# Patient Record
Sex: Female | Born: 1970 | Race: Black or African American | Hispanic: No | Marital: Single | Smoking: Current every day smoker
Health system: Southern US, Community
[De-identification: ages and names within clinical notes are randomized; demographics above are authoritative.]

## PROBLEM LIST (undated history)

## (undated) DIAGNOSIS — R519 Headache, unspecified: Secondary | ICD-10-CM

## (undated) DIAGNOSIS — G459 Transient cerebral ischemic attack, unspecified: Secondary | ICD-10-CM

## (undated) DIAGNOSIS — R51 Headache: Secondary | ICD-10-CM

## (undated) DIAGNOSIS — E119 Type 2 diabetes mellitus without complications: Secondary | ICD-10-CM

## (undated) DIAGNOSIS — I6789 Other cerebrovascular disease: Secondary | ICD-10-CM

## (undated) DIAGNOSIS — F319 Bipolar disorder, unspecified: Secondary | ICD-10-CM

## (undated) DIAGNOSIS — F32A Depression, unspecified: Secondary | ICD-10-CM

## (undated) DIAGNOSIS — M4712 Other spondylosis with myelopathy, cervical region: Secondary | ICD-10-CM

## (undated) DIAGNOSIS — I639 Cerebral infarction, unspecified: Secondary | ICD-10-CM

## (undated) DIAGNOSIS — F419 Anxiety disorder, unspecified: Secondary | ICD-10-CM

## (undated) DIAGNOSIS — D649 Anemia, unspecified: Secondary | ICD-10-CM

## (undated) DIAGNOSIS — R569 Unspecified convulsions: Secondary | ICD-10-CM

## (undated) DIAGNOSIS — Q2112 Patent foramen ovale: Secondary | ICD-10-CM

## (undated) DIAGNOSIS — K760 Fatty (change of) liver, not elsewhere classified: Secondary | ICD-10-CM

## (undated) DIAGNOSIS — G709 Myoneural disorder, unspecified: Secondary | ICD-10-CM

## (undated) DIAGNOSIS — M1990 Unspecified osteoarthritis, unspecified site: Secondary | ICD-10-CM

## (undated) DIAGNOSIS — K219 Gastro-esophageal reflux disease without esophagitis: Secondary | ICD-10-CM

## (undated) DIAGNOSIS — I10 Essential (primary) hypertension: Secondary | ICD-10-CM

## (undated) DIAGNOSIS — Q211 Atrial septal defect, unspecified: Secondary | ICD-10-CM

## (undated) DIAGNOSIS — F329 Major depressive disorder, single episode, unspecified: Secondary | ICD-10-CM

## (undated) DIAGNOSIS — K8050 Calculus of bile duct without cholangitis or cholecystitis without obstruction: Secondary | ICD-10-CM

## (undated) HISTORY — PX: TUBAL LIGATION: SHX77

## (undated) HISTORY — PX: APPENDECTOMY: SHX54

## (undated) HISTORY — PX: ABDOMINAL HYSTERECTOMY: SHX81

## (undated) HISTORY — PX: OVARIAN CYST REMOVAL: SHX89

---

## 2003-11-26 ENCOUNTER — Emergency Department: Payer: Self-pay | Admitting: Unknown Physician Specialty

## 2004-01-13 ENCOUNTER — Emergency Department: Payer: Self-pay | Admitting: General Practice

## 2004-01-24 ENCOUNTER — Ambulatory Visit: Payer: Self-pay | Admitting: Surgery

## 2004-06-28 ENCOUNTER — Emergency Department: Payer: Self-pay | Admitting: General Practice

## 2004-08-13 ENCOUNTER — Emergency Department: Payer: Self-pay | Admitting: Emergency Medicine

## 2006-03-02 ENCOUNTER — Emergency Department: Payer: Self-pay | Admitting: Emergency Medicine

## 2006-03-29 ENCOUNTER — Emergency Department: Payer: Self-pay | Admitting: Emergency Medicine

## 2006-03-31 ENCOUNTER — Emergency Department: Payer: Self-pay | Admitting: Emergency Medicine

## 2006-08-23 ENCOUNTER — Emergency Department: Payer: Self-pay | Admitting: Emergency Medicine

## 2006-08-23 ENCOUNTER — Other Ambulatory Visit: Payer: Self-pay

## 2006-09-18 ENCOUNTER — Emergency Department: Payer: Self-pay | Admitting: Emergency Medicine

## 2006-11-28 ENCOUNTER — Emergency Department: Payer: Self-pay | Admitting: Emergency Medicine

## 2007-02-26 ENCOUNTER — Other Ambulatory Visit: Payer: Self-pay

## 2007-02-26 ENCOUNTER — Emergency Department: Payer: Self-pay | Admitting: Emergency Medicine

## 2007-09-13 ENCOUNTER — Emergency Department: Payer: Self-pay | Admitting: Emergency Medicine

## 2007-11-15 ENCOUNTER — Emergency Department: Payer: Self-pay | Admitting: Otolaryngology

## 2008-04-28 ENCOUNTER — Emergency Department: Payer: Self-pay | Admitting: Emergency Medicine

## 2008-06-06 ENCOUNTER — Emergency Department: Payer: Self-pay | Admitting: Unknown Physician Specialty

## 2008-07-03 ENCOUNTER — Observation Stay: Payer: Self-pay | Admitting: General Surgery

## 2008-10-22 ENCOUNTER — Emergency Department: Payer: Self-pay | Admitting: Emergency Medicine

## 2010-01-08 ENCOUNTER — Emergency Department: Payer: Self-pay | Admitting: Emergency Medicine

## 2010-06-12 ENCOUNTER — Other Ambulatory Visit: Payer: Self-pay | Admitting: Neurology

## 2010-06-12 DIAGNOSIS — R51 Headache: Secondary | ICD-10-CM

## 2010-06-18 ENCOUNTER — Ambulatory Visit
Admission: RE | Admit: 2010-06-18 | Discharge: 2010-06-18 | Disposition: A | Discharge status: Home / Self Care | Payer: Self-pay | Source: Ambulatory Visit | Attending: Neurology | Admitting: Neurology

## 2010-06-18 DIAGNOSIS — R51 Headache: Secondary | ICD-10-CM

## 2010-06-21 ENCOUNTER — Emergency Department: Payer: Self-pay | Admitting: Unknown Physician Specialty

## 2010-12-26 ENCOUNTER — Emergency Department: Payer: Self-pay | Admitting: Emergency Medicine

## 2011-07-13 ENCOUNTER — Emergency Department: Payer: Self-pay | Admitting: *Deleted

## 2011-07-13 LAB — BASIC METABOLIC PANEL
Anion Gap: 8 (ref 7–16)
BUN: 7 mg/dL (ref 7–18)
Calcium, Total: 8.7 mg/dL (ref 8.5–10.1)
Co2: 24 mmol/L (ref 21–32)
EGFR (African American): >60
Osmolality: 278 (ref 275–301)
Potassium: 3.8 mmol/L (ref 3.5–5.1)
Sodium: 141 mmol/L (ref 136–145)

## 2011-07-13 LAB — CBC
HCT: 38.2 % (ref 35.0–47.0)
MCV: 81 fL (ref 80–100)
RBC: 4.73 10*6/uL (ref 3.80–5.20)
RDW: 13.7 % (ref 11.5–14.5)
WBC: 5.6 10*3/uL (ref 3.6–11.0)

## 2011-07-13 LAB — CK TOTAL AND CKMB (NOT AT ARMC): CK, Total: 105 U/L (ref 21–215)

## 2011-09-21 ENCOUNTER — Emergency Department: Payer: Self-pay | Admitting: Emergency Medicine

## 2011-09-21 LAB — COMPREHENSIVE METABOLIC PANEL
Albumin: 4.2 g/dL (ref 3.4–5.0)
Anion Gap: 8 (ref 7–16)
BUN: 7 mg/dL (ref 7–18)
Creatinine: 0.6 mg/dL (ref 0.60–1.30)
Glucose: 93 mg/dL (ref 65–99)
Osmolality: 275 (ref 275–301)
Potassium: 3.9 mmol/L (ref 3.5–5.1)
SGOT(AST): 17 U/L (ref 15–37)
Sodium: 139 mmol/L (ref 136–145)
Total Protein: 7.9 g/dL (ref 6.4–8.2)

## 2011-09-21 LAB — CBC WITH DIFFERENTIAL/PLATELET
Basophil #: 0 10*3/uL (ref 0.0–0.1)
Eosinophil %: 1.6 %
HCT: 40.7 % (ref 35.0–47.0)
Lymphocyte %: 57.5 %
Monocyte %: 8.5 %
Neutrophil #: 1.4 10*3/uL (ref 1.4–6.5)
RBC: 5.14 10*6/uL (ref 3.80–5.20)
RDW: 14.2 % (ref 11.5–14.5)
WBC: 4.3 10*3/uL (ref 3.6–11.0)

## 2012-03-27 ENCOUNTER — Emergency Department: Payer: Self-pay | Admitting: Emergency Medicine

## 2012-08-09 ENCOUNTER — Emergency Department: Payer: Self-pay | Admitting: Emergency Medicine

## 2013-04-21 ENCOUNTER — Emergency Department: Payer: Self-pay | Admitting: Emergency Medicine

## 2013-04-21 LAB — URINALYSIS, COMPLETE
Bilirubin,UR: NEGATIVE
GLUCOSE, UR: NEGATIVE mg/dL (ref 0–75)
KETONE: NEGATIVE
Nitrite: NEGATIVE
Ph: 5 (ref 4.5–8.0)
Protein: 30
Specific Gravity: 1.023 (ref 1.003–1.030)
WBC UR: 216 /HPF (ref 0–5)

## 2015-02-11 ENCOUNTER — Emergency Department (HOSPITAL_COMMUNITY)
Admission: EM | Admit: 2015-02-11 | Discharge: 2015-02-11 | Disposition: A | Discharge status: Home / Self Care | Payer: Self-pay | Attending: Emergency Medicine | Admitting: Emergency Medicine

## 2015-02-11 ENCOUNTER — Encounter (HOSPITAL_COMMUNITY): Payer: Self-pay | Admitting: Nurse Practitioner

## 2015-02-11 DIAGNOSIS — M542 Cervicalgia: Secondary | ICD-10-CM | POA: Insufficient documentation

## 2015-02-11 DIAGNOSIS — R202 Paresthesia of skin: Secondary | ICD-10-CM | POA: Insufficient documentation

## 2015-02-11 DIAGNOSIS — G8929 Other chronic pain: Secondary | ICD-10-CM | POA: Insufficient documentation

## 2015-02-11 DIAGNOSIS — M25511 Pain in right shoulder: Secondary | ICD-10-CM | POA: Insufficient documentation

## 2015-02-11 NOTE — Discharge Instructions (Signed)
Perform gentle range of motion exercises daily. Use heat to your shoulder throughout the day, using a heat pack for 20 minutes at a time every hour. Use motrin/aleve or other over the counter pain medications as needed for pain. Call orthopedic follow up today or tomorrow to schedule followup appointment for recheck of ongoing shoulder pain in 1-2 weeks that can be canceled with a 24-48 hour notice if complete resolution of pain. Return to the ER for changes or worsening symptoms.    Shoulder Pain The shoulder is the joint that connects your arm to your body. Muscles and band-like tissues that connect bones to muscles (tendons) hold the joint together. Shoulder pain is felt if an injury or medical problem affects one or more parts of the shoulder. HOME CARE   Put ice on the sore area.  Put ice in a plastic bag.  Place a towel between your skin and the bag.  Leave the ice on for 15-20 minutes, 03-04 times a day for the first 2 days.  Stop using cold packs if they do not help with the pain.  If you were given something to keep your shoulder from moving (sling; shoulder immobilizer), wear it as told. Only take it off to shower or bathe.  Move your arm as little as possible, but keep your hand moving to prevent puffiness (swelling).  Squeeze a soft ball or foam pad as much as possible to help prevent swelling.  Take medicine as told by your doctor. GET HELP IF:  You have progressing new pain in your arm, hand, or fingers.  Your hand or fingers get cold.  Your medicine does not help lessen your pain. GET HELP RIGHT AWAY IF:   Your arm, hand, or fingers are numb or tingling.  Your arm, hand, or fingers are puffy (swollen), painful, or turn white or blue. MAKE SURE YOU:   Understand these instructions.  Will watch your condition.  Will get help right away if you are not doing well or get worse.   This information is not intended to replace advice given to you by your health care  provider. Make sure you discuss any questions you have with your health care provider.   Document Released: 07/28/2007 Document Revised: 03/01/2014 Document Reviewed: 06/03/2014 Elsevier Interactive Patient Education 2016 [employer]  Shoulder Range of Motion Exercises Shoulder range of motion (ROM) exercises are designed to keep the shoulder moving freely. They are often recommended for people who have shoulder pain. MOVEMENT EXERCISE When you are able, do this exercise 5-6 days per week, or as told by your health care provider. Work toward doing 2 sets of 10 swings. Pendulum Exercise How To Do This Exercise Lying Down  Lie face-down on a bed with your abdomen close to the side of the bed.  Let your arm hang over the side of the bed.  Relax your shoulder, arm, and hand.  Slowly and gently swing your arm forward and back. Do not use your neck muscles to swing your arm. They should be relaxed. If you are struggling to swing your arm, have someone gently swing it for you. When you do this exercise for the first time, swing your arm at a 15 degree angle for 15 seconds, or swing your arm 10 times. As pain lessens over time, increase the angle of the swing to 30-45 degrees.  Repeat steps 1-4 with the other arm. How To Do This Exercise While Standing  Stand next to a sturdy chair  or table and hold on to it with your hand.  Bend forward at the waist.  Bend your knees slightly.  Relax your other arm and let it hang limp.  Relax the shoulder blade of the arm that is hanging and let it drop.  While keeping your shoulder relaxed, use body motion to swing your arm in small circles. The first time you do this exercise, swing your arm for about 30 seconds or 10 times. When you do it next time, swing your arm for a little longer.  Stand up tall and relax.  Repeat steps 1-7, this time changing the direction of the circles.  Repeat steps 1-8 with the other arm. STRETCHING EXERCISES Do  these exercises 3-4 times per day on 5-6 days per week or as told by your health care provider. Work toward holding the stretch for 20 seconds. Stretching Exercise 1  Lift your arm straight out in front of you.  Bend your arm 90 degrees at the elbow (right angle) so your forearm goes across your body and looks like the letter "L."  Use your other arm to gently pull the elbow forward and across your body.  Repeat steps 1-3 with the other arm. Stretching Exercise 2 You will need a towel or rope for this exercise.  Bend one arm behind your back with the palm facing outward.  Hold a towel with your other hand.  Reach the arm that holds the towel above your head, and bend that arm at the elbow. Your wrist should be behind your neck.  Use your free hand to grab the free end of the towel.  With the higher hand, gently pull the towel up behind you.  With the lower hand, pull the towel down behind you.  Repeat steps 1-6 with the other arm. STRENGTHENING EXERCISES Do each of these exercises at four different times of day (sessions) every day or as told by your health care provider. To begin with, repeat each exercise 5 times (repetitions). Work toward doing 3 sets of 12 repetitions or as told by your health care provider. Strengthening Exercise 1 You will need a light weight for this activity. As you grow stronger, you may use a heavier weight. 1. Standing with a weight in your hand, lift your arm straight out to the side until it is at the same height as your shoulder. 2. Bend your arm at 90 degrees so that your fingers are pointing to the ceiling. 3. Slowly raise your hand until your arm is straight up in the air. 4. Repeat steps 1-3 with the other arm. Strengthening Exercise 2 You will need a light weight for this activity. As you grow stronger, you may use a heavier weight. 1. Standing with a weight in your hand, gradually move your straight arm in an arc, starting at your side, then  out in front of you, then straight up over your head. 2. Gradually move your other arm in an arc, starting at your side, then out in front of you, then straight up over your head. 3. Repeat steps 1-2 with the other arm. Strengthening Exercise 3 You will need an elastic band for this activity. As you grow stronger, gradually increase the size of the bands or increase the number of bands that you use at one time. 1. While standing, hold an elastic band in one hand and raise that arm up in the air. 2. With your other hand, pull down the band until that hand is  by your side. 3. Repeat steps 1-2 with the other arm.   This information is not intended to replace advice given to you by your health care provider. Make sure you discuss any questions you have with your health care provider.   Document Released: 11/07/2002 Document Revised: 06/25/2014 Document Reviewed: 02/04/2014 Elsevier Interactive Patient Education 2016 [employer]  Paresthesia Paresthesia is a burning or prickling feeling. This feeling can happen in any part of the body. It often happens in the hands, arms, legs, or feet. Usually, it is not painful. In most cases, the feeling goes away in a short time and is not a sign of a serious problem. HOME CARE  Avoid drinking alcohol.  Try massage or needle therapy (acupuncture) to help with your problems.  Keep all follow-up visits as told by your doctor. This is important. GET HELP IF:  You keep on having episodes of paresthesia.  Your burning or prickling feeling gets worse when you walk.  You have pain or cramps.  You feel dizzy.  You have a rash. GET HELP RIGHT AWAY IF:  You feel weak.  You have trouble walking or moving.  You have problems speaking, understanding, or seeing.  You feel confused.  You cannot control when you pee (urinate) or poop (bowel movement).  You lose feeling (numbness) after an injury.  You pass out (faint).   This information is not  intended to replace advice given to you by your health care provider. Make sure you discuss any questions you have with your health care provider.   Document Released: 01/22/2008 Document Revised: 06/25/2014 Document Reviewed: 02/04/2014 Elsevier Interactive Patient Education 2016 [place] therapy can help ease sore, stiff, injured, and tight muscles and joints. Heat relaxes your muscles, which may help ease your pain. Heat therapy should only be used on old, pre-existing, or long-lasting (chronic) injuries. Do not use heat therapy unless told by your doctor. HOW TO USE HEAT THERAPY There are several different kinds of heat therapy, including:  Moist heat pack.  Warm water bath.  Hot water bottle.  Electric heating pad.  Heated gel pack.  Heated wrap.  Electric heating pad. GENERAL HEAT THERAPY RECOMMENDATIONS   Do not sleep while using heat therapy. Only use heat therapy while you are awake.  Your skin may turn pink while using heat therapy. Do not use heat therapy if your skin turns red.  Do not use heat therapy if you have new pain.  High heat or long exposure to heat can cause burns. Be careful when using heat therapy to avoid burning your skin.  Do not use heat therapy on areas of your skin that are already irritated, such as with a rash or sunburn. GET HELP IF:   You have blisters, redness, swelling (puffiness), or numbness.  You have new pain.  Your pain is worse. MAKE SURE YOU:  Understand these instructions.  Will watch your condition.  Will get help right away if you are not doing well or get worse.   This information is not intended to replace advice given to you by your health care provider. Make sure you discuss any questions you have with your health care provider.   Document Released: 05/03/2011 Document Revised: 03/01/2014 Document Reviewed: 04/03/2013 Elsevier Interactive Patient Education [employer].

## 2015-02-11 NOTE — ED Provider Notes (Signed)
CSN: [id number]     Arrival date & time 02/11/15  1740 History  By signing my name below, I, Heather Figueroa, attest that this documentation has been prepared under the direction and in the presence of Nichole Neyer Camprubi-Soms, PA-C. Electronically Signed: Julien Figueroa, ED Scribe. 02/11/2015. 6:05 PM.    Chief Complaint  Patient presents with  . Shoulder Pain      Patient is a 44 y.o. female presenting with shoulder pain. The history is provided by the patient. No language interpreter was used.  Shoulder Pain Location:  Shoulder Time since incident:  12 months Injury: no   Shoulder location:  R shoulder Pain details:    Quality:  Cramping and aching   Radiates to:  R elbow   Severity:  Moderate   Onset quality:  Gradual   Duration:  12 months   Timing:  Constant   Progression:  Worsening Chronicity:  Chronic Handedness:  Left-handed Dislocation: no   Foreign body present:  No foreign bodies Prior injury to area:  No Relieved by:  Nothing Worsened by:  Movement Ineffective treatments:  Acetaminophen Associated symptoms: neck pain (R sided)   Associated symptoms: no back pain, no decreased range of motion, no fever, no numbness, no swelling and no tingling   HPI Comments: Heather Figueroa is a 44 y.o. female who presents to the Emergency Department complaining of constant, 9/10, gradual worsening aching and cramping right shoulder pain with associated right sided neck pain onset one year ago. Pt endorses intermittent numbness that radiates to her elbow which occurred earlier but has since resolved. She notes that the numbness will come out of the blue. Pt notes that moving her right arm and laying down increases her pain. She has taken Aleve and BC powder to alleviate the pain with no relief. Denies injury, headaches, visual changes, fevers, chills, CP, SOB, abd pain, N/V/D/C, hematuria, dysuria, myalgias, joint swelling, focal weakness, or skin changes. She has not seen a doctor for  her symptoms.   No past medical history on file. No past surgical history on file. No family history on file. Social History  Substance Use Topics  . Smoking status: Not on file  . Smokeless tobacco: Not on file  . Alcohol Use: Not on file   OB History    No data available     Review of Systems  Constitutional: Negative for fever and chills.  Eyes: Negative for visual disturbance.  Respiratory: Negative for shortness of breath.   Cardiovascular: Negative for chest pain.  Gastrointestinal: Negative for nausea, vomiting, abdominal pain, diarrhea and constipation.  Genitourinary: Negative for dysuria and hematuria.  Musculoskeletal: Positive for arthralgias and neck pain (R sided). Negative for back pain and joint swelling.  Skin: Negative for color change.  Allergic/Immunologic: Negative for immunocompromised state.  Neurological: Negative for weakness, numbness and headaches.   10 Systems reviewed and are negative for acute change except as noted in the HPI.    Allergies  Review of patient's allergies indicates not on file.  Home Medications   Prior to Admission medications   Not on File   Triage vitals: BP 146/99 mmHg  Pulse 86  Temp(Src) 98.1 F (36.7 C) (Oral)  Resp 18  Ht 5' (1.524 m)  Wt 137 lb (62.143 kg)  BMI 26.76 kg/m2  SpO2 99% Physical Exam  Constitutional: She is oriented to person, place, and time. Vital signs are normal. She appears well-developed and well-nourished.  Non-toxic appearance. No distress.  Afebrile, nontoxic, NAD  HENT:  Head: Normocephalic and atraumatic.  Mouth/Throat: Mucous membranes are normal.  Eyes: Conjunctivae and EOM are normal. Right eye exhibits no discharge. Left eye exhibits no discharge.  Neck: Normal range of motion. Neck supple. Muscular tenderness present. No rigidity. Normal range of motion present.    FROM intact without spinous process TTP, no bony stepoffs or deformities, with mild right sided paraspinous  muscle TTP without muscle spasms. No rigidity or meningeal signs. No bruising or swelling.   Cardiovascular: Normal rate.   Pulmonary/Chest: Effort normal. No respiratory distress.  Abdominal: Normal appearance. She exhibits no distension.  Musculoskeletal: Normal range of motion.       Right shoulder: She exhibits tenderness. She exhibits normal range of motion, no bony tenderness, no swelling, no crepitus, no deformity, no spasm, normal pulse and normal strength.  Right shoulder with FROM intact, no bony TTP, with mild diffuse parascapular muscular TTP without spasms, no swelling/effusion, no crepitus/deformity, +apley scratch, +pain with resisted int/ext rotation, neg empty can test. Strength and sensation grossly intact in all extremities, distal pulses intact.    Neurological: She is alert and oriented to person, place, and time. She has normal strength. No sensory deficit.  Skin: Skin is warm, dry and intact. No rash noted.  Psychiatric: She has a normal mood and affect. Her behavior is normal.  Nursing note and vitals reviewed.   ED Course  Procedures  DIAGNOSTIC STUDIES: Oxygen Saturation is 99% on RA, normal by my interpretation.  COORDINATION OF CARE:  6:00 PM Discussed treatment plan which includes heat compresses, continue aleve, schedule appointment with orthopaedist with pt at bedside and pt agreed to plan.  Labs Review Labs Reviewed - No data to display  Imaging Review No results found. I have personally reviewed and evaluated these images and lab results as part of my medical decision-making.   EKG Interpretation None      MDM   Final diagnoses:  Shoulder joint pain, right  Paresthesia of right arm    44 y.o. female here with chronic shoulder pain. NVI with soft compartments. Diffuse muscular tenderness, no focal bony tenderness. Doubt need for imaging given chronic nature and no bony tenderness. Had paresthesia earlier which resolved. Discussed ultimately f/up  with ortho could provide more answers, possible MRI and nerve conduction study. Doubt that immobilization would help. Discussed tylenol/motrin for pain, and use of heat. I explained the diagnosis and have given explicit precautions to return to the ER including for any other new or worsening symptoms. The patient understands and accepts the medical plan as it's been dictated and I have answered their questions. Discharge instructions concerning home care and prescriptions have been given. The patient is STABLE and is discharged to home in good condition.   I personally performed the services described in this documentation, which was scribed in my presence. The recorded information has been reviewed and is accurate.  BP 146/99 mmHg  Pulse 86  Temp(Src) 98.1 F (36.7 C) (Oral)  Resp 18  Ht 5' (1.524 m)  Wt 62.143 kg  BMI 26.76 kg/m2  SpO2 99%  No orders of the defined types were placed in this encounter.     [address] [place], PA-C 02/11/15 1808  Lacretia Leigh, MD 02/13/15 [phone]

## 2015-02-11 NOTE — ED Notes (Signed)
She c/o R shoulder pain and numbness radiating into elbow for past year.she states the pain became more severe today and she can not tolerate any longer. Pain increased with lying on the extremity. Pain decreased by nothing. Tried aleve several times with no relief.

## 2015-08-07 ENCOUNTER — Other Ambulatory Visit (HOSPITAL_COMMUNITY)
Admission: RE | Admit: 2015-08-07 | Discharge: 2015-08-07 | Disposition: A | Discharge status: Home / Self Care | Payer: Managed Care, Other (non HMO) | Source: Ambulatory Visit | Attending: Family Medicine | Admitting: Family Medicine

## 2015-08-07 ENCOUNTER — Other Ambulatory Visit: Payer: Self-pay | Admitting: Family Medicine

## 2015-08-07 DIAGNOSIS — Z01419 Encounter for gynecological examination (general) (routine) without abnormal findings: Secondary | ICD-10-CM | POA: Diagnosis present

## 2015-08-07 DIAGNOSIS — Z1231 Encounter for screening mammogram for malignant neoplasm of breast: Secondary | ICD-10-CM

## 2015-08-11 LAB — CYTOLOGY - PAP

## 2015-08-21 ENCOUNTER — Ambulatory Visit: Payer: Self-pay

## 2015-09-04 ENCOUNTER — Ambulatory Visit
Admission: RE | Admit: 2015-09-04 | Discharge: 2015-09-04 | Disposition: A | Discharge status: Home / Self Care | Payer: Managed Care, Other (non HMO) | Source: Ambulatory Visit | Attending: Family Medicine | Admitting: Family Medicine

## 2015-09-04 DIAGNOSIS — Z1231 Encounter for screening mammogram for malignant neoplasm of breast: Secondary | ICD-10-CM

## 2015-10-28 ENCOUNTER — Emergency Department (HOSPITAL_COMMUNITY): Payer: Managed Care, Other (non HMO)

## 2015-10-28 ENCOUNTER — Emergency Department (HOSPITAL_COMMUNITY)
Admission: EM | Admit: 2015-10-28 | Discharge: 2015-10-28 | Disposition: A | Discharge status: Home / Self Care | Payer: Managed Care, Other (non HMO) | Attending: Emergency Medicine | Admitting: Emergency Medicine

## 2015-10-28 ENCOUNTER — Encounter (HOSPITAL_COMMUNITY): Payer: Self-pay | Admitting: *Deleted

## 2015-10-28 DIAGNOSIS — G43809 Other migraine, not intractable, without status migrainosus: Secondary | ICD-10-CM

## 2015-10-28 DIAGNOSIS — Z79899 Other long term (current) drug therapy: Secondary | ICD-10-CM | POA: Insufficient documentation

## 2015-10-28 DIAGNOSIS — J189 Pneumonia, unspecified organism: Secondary | ICD-10-CM | POA: Diagnosis not present

## 2015-10-28 DIAGNOSIS — F17210 Nicotine dependence, cigarettes, uncomplicated: Secondary | ICD-10-CM | POA: Insufficient documentation

## 2015-10-28 DIAGNOSIS — R51 Headache: Secondary | ICD-10-CM | POA: Diagnosis present

## 2015-10-28 LAB — CBC
HEMATOCRIT: 37.7 % (ref 36.0–46.0)
HEMOGLOBIN: 12.1 g/dL (ref 12.0–15.0)
MCH: 25.7 pg — ABNORMAL LOW (ref 26.0–34.0)
MCHC: 32.1 g/dL (ref 30.0–36.0)
MCV: 80 fL (ref 78.0–100.0)
Platelets: 200 10*3/uL (ref 150–400)
RBC: 4.71 MIL/uL (ref 3.87–5.11)
RDW: 14.5 % (ref 11.5–15.5)
WBC: 4.8 10*3/uL (ref 4.0–10.5)

## 2015-10-28 LAB — BASIC METABOLIC PANEL
ANION GAP: 7 (ref 5–15)
BUN: 8 mg/dL (ref 6–20)
CO2: 20 mmol/L — ABNORMAL LOW (ref 22–32)
Calcium: 9.1 mg/dL (ref 8.9–10.3)
Chloride: 111 mmol/L (ref 101–111)
Creatinine, Ser: 0.58 mg/dL (ref 0.44–1.00)
Glucose, Bld: 92 mg/dL (ref 65–99)
POTASSIUM: 3.7 mmol/L (ref 3.5–5.1)
SODIUM: 138 mmol/L (ref 135–145)

## 2015-10-28 LAB — SALICYLATE LEVEL: Salicylate Lvl: <4 mg/dL (ref 2.8–30.0)

## 2015-10-28 LAB — I-STAT BETA HCG BLOOD, ED (MC, WL, AP ONLY)

## 2015-10-28 LAB — RAPID URINE DRUG SCREEN, HOSP PERFORMED
Amphetamines: NOT DETECTED
BARBITURATES: NOT DETECTED
Benzodiazepines: NOT DETECTED
COCAINE: NOT DETECTED
OPIATES: NOT DETECTED
Tetrahydrocannabinol: NOT DETECTED

## 2015-10-28 LAB — ETHANOL

## 2015-10-28 LAB — I-STAT TROPONIN, ED: Troponin i, poc: 0 ng/mL (ref 0.00–0.08)

## 2015-10-28 LAB — ACETAMINOPHEN LEVEL: Acetaminophen (Tylenol), Serum: <10 ug/mL — ABNORMAL LOW (ref 10–30)

## 2015-10-28 MED ORDER — SODIUM CHLORIDE 0.9 % IV BOLUS (SEPSIS)
1000.0000 mL | Freq: Once | INTRAVENOUS | Status: AC
Start: 2015-10-28 — End: 2015-10-28
  Administered 2015-10-28: 1000 mL via INTRAVENOUS

## 2015-10-28 MED ORDER — KETOROLAC TROMETHAMINE 30 MG/ML IJ SOLN
30.0000 mg | Freq: Once | INTRAMUSCULAR | Status: AC
  Administered 2015-10-28: 30 mg via INTRAVENOUS
  Filled 2015-10-28: qty 1

## 2015-10-28 MED ORDER — DOXYCYCLINE HYCLATE 100 MG PO CAPS: 100.0000 mg | ORAL_CAPSULE | Freq: Two times a day (BID) | ORAL | 0 refills | Status: DC

## 2015-10-28 MED ORDER — DOXYCYCLINE HYCLATE 100 MG PO TABS
100.0000 mg | ORAL_TABLET | Freq: Once | ORAL | Status: AC
  Administered 2015-10-28: 100 mg via ORAL
  Filled 2015-10-28: qty 1

## 2015-10-28 MED ORDER — HALOPERIDOL LACTATE 5 MG/ML IJ SOLN
2.0000 mg | Freq: Once | INTRAMUSCULAR | Status: AC
  Administered 2015-10-28: 2 mg via INTRAVENOUS
  Filled 2015-10-28: qty 1

## 2015-10-28 MED ORDER — METOCLOPRAMIDE HCL 5 MG/ML IJ SOLN
10.0000 mg | Freq: Once | INTRAMUSCULAR | Status: AC
  Administered 2015-10-28: 10 mg via INTRAVENOUS
  Filled 2015-10-28: qty 2

## 2015-10-28 NOTE — ED Triage Notes (Signed)
Pt in from home, pt reports waking up today with migraine & mid non radiating CP onset today, no SOB, pt reports x 2 vomiting last night, denies v/d, pt rcvd 324 mg ASA, x 2 sL nitro, pt reported to have fall today, pt denies  Neck & back, pt A&O x4

## 2015-10-28 NOTE — ED Notes (Signed)
Pt was able to ambulate in hallway with no assistance. Pt only complained on pain to left foot when pressure was applied while walking.

## 2015-10-28 NOTE — ED Notes (Signed)
Pt departed in NAD.  

## 2015-10-28 NOTE — ED Provider Notes (Signed)
[place] DEPT Provider Note   CSN: [id number] Arrival date & time: 10/28/15  1158     History   Chief Complaint Chief Complaint  Patient presents with  . Chest Pain  . Fall    HPI Heather Figueroa is a 45 y.o. female.  HPI  45 year old female who presents with headache and mental status changes. History of migraine headaches. History primarily provided by significant other. States she has been feeling well and she was her normal self this morning. Had two episodes of vomiting last night and complained of headache late morning that was consistent with her typical migraine headaches. Her significant other states that she called her this morning stating that her headache was not getting better and so she returned home to see her, and found her on the ground. Stated that she was not breathing well and not responsive, so she called EMS. She felt that she was not breathing, so gave her chest compressions, which she started to groan. EMS arrived. She had pulses. Family states that she is under significant amount of stress recently. No tongue biting or incontinence.    History reviewed. No pertinent past medical history.  There are no active problems to display for this patient.   History reviewed. No pertinent surgical history.  OB History    No data available       Home Medications    Prior to Admission medications   Medication Sig Start Date End Date Taking? Authorizing Provider  traZODone (DESYREL) 50 MG tablet Take 50-100 mg by mouth at bedtime as needed for sleep.  08/15/15  Yes Historical Provider, MD  venlafaxine XR (EFFEXOR-XR) 150 MG 24 hr capsule Take 150 mg by mouth every morning. 10/02/15  Yes Historical Provider, MD    Family History No family history on file.  Social History Social History  Substance Use Topics  . Smoking status: Current Every Day Smoker    Packs/day: 1.00    Types: Cigarettes  . Smokeless tobacco: Not on file  . Alcohol use No      Allergies   Percocet [oxycodone-acetaminophen] and Tylenol [acetaminophen]   Review of Systems Review of Systems 10/14 systems reviewed and are negative other than those stated in the HPI   Physical Exam Updated Vital Signs BP 122/82   Pulse 69   Temp 98.3 F (36.8 C) (Axillary)   Resp 15   SpO2 100%   Physical Exam Physical Exam  Nursing note and vitals reviewed. Constitutional:  non-toxic, and in no acute distress Head: Normocephalic and atraumatic.  Mouth/Throat: Oropharynx is clear and moist.  Neck: Normal range of motion. Neck supple.  Cardiovascular: Normal rate and regular rhythm.   Pulmonary/Chest: Effort normal and breath sounds normal.  Abdominal: Soft. There is no tenderness. There is no rebound and no guarding.  Musculoskeletal: Normal range of motion.  Neurological: Alert, tearful, answers questions minimally, no facial droop, fluent speech, moves all extremities symmetrically, sensation to light touch in tact throughout, PERRL Skin: Skin is warm and dry.  Psychiatric: Cooperative   ED Treatments / Results  Labs (all labs ordered are listed, but only abnormal results are displayed) Labs Reviewed  BASIC METABOLIC PANEL - Abnormal; Notable for the following:       Result Value   CO2 20 (*)    All other components within normal limits  CBC - Abnormal; Notable for the following:    MCH 25.7 (*)    All other components within normal limits  ACETAMINOPHEN LEVEL - Abnormal; Notable for the following:    Acetaminophen (Tylenol), Serum <10 (*)    All other components within normal limits  URINE RAPID DRUG SCREEN, HOSP PERFORMED  SALICYLATE LEVEL  ETHANOL  I-STAT TROPOININ, ED  I-STAT BETA HCG BLOOD, ED ([facility], [facility], [facility] ONLY)    EKG  EKG Interpretation  Date/Time:  Tuesday October 28 2015 12:07:03 EDT Ventricular Rate:  65 PR Interval:    QRS Duration: 86 QT Interval:  391 QTC Calculation: 407 R Axis:   66 Text Interpretation:  Sinus rhythm  Anterior infarct, old No significant change since last tracing Confirmed by Sanaia Jasso MD, Bethany Hirt [phone]) on 10/28/2015 1:41:24 PM       Radiology Dg Chest 2 View  Result Date: 10/28/2015 CLINICAL DATA:  Cough and weakness. EXAM: CHEST  2 VIEW COMPARISON:  None. FINDINGS: Ill-defined left lower lobe airspace disease is concerning for early pneumonia. The lungs are otherwise clear. The heart size is normal. The visualized soft tissues and bony thorax are unremarkable. IMPRESSION: Early left lower lobe pneumonia. Electronically Signed   By: San Morelle M.D.   On: 10/28/2015 12:31   Ct Head Wo Contrast  Result Date: 10/28/2015 CLINICAL DATA:  Syncopal episode, fell to ground. Frontal headache and nausea. Altered mental status. EXAM: CT HEAD WITHOUT CONTRAST TECHNIQUE: Contiguous axial images were obtained from the base of the skull through the vertex without intravenous contrast. COMPARISON:  CT HEAD April 21, 2013 FINDINGS: BRAIN: The ventricles and sulci are normal. No intraparenchymal hemorrhage, mass effect nor midline shift. No acute large vascular territory infarcts. No abnormal extra-axial fluid collections. Basal cisterns are patent. VASCULAR: Unremarkable. SKULL/SOFT TISSUES: No skull fracture. No significant soft tissue swelling. ORBITS/SINUSES: The included ocular globes and orbital contents are normal.The mastoid aircells and included paranasal sinuses are well-aerated. OTHER: None. IMPRESSION: No acute intracranial process ; normal CT HEAD. Electronically Signed   By: Elon Alas M.D.   On: 10/28/2015 14:45    Procedures Procedures (including critical care time)  Medications Ordered in ED Medications  sodium chloride 0.9 % bolus 1,000 mL (0 mLs Intravenous Stopped 10/28/15 1541)  metoCLOPramide (REGLAN) injection 10 mg (10 mg Intravenous Given 10/28/15 1409)  ketorolac (TORADOL) 30 MG/ML injection 30 mg (30 mg Intravenous Given 10/28/15 1407)  haloperidol lactate (HALDOL) injection 2  mg (2 mg Intravenous Given 10/28/15 1653)  doxycycline (VIBRA-TABS) tablet 100 mg (100 mg Oral Given 10/28/15 1653)     Initial Impression / Assessment and Plan / ED Course  I have reviewed the triage vital signs and the nursing notes.  Pertinent labs & imaging results that were available during my care of the patient were reviewed by me and considered in my medical decision making (see chart for details).  Clinical Course   Presenting with headache and mental status changes. On presentation, tearful but alert, answering simple questions, no focal deficits. CT head negative. EKG w/o stigmata of arrhythmia.   Do not think she had true cardiopulmonary arrest, as she was breathing normally and with normal VS on ems arrival. No true loss of pulses. Aroused after sternal rub. CP after CPR and sternal rub. CXR w/ concerning PNA. Will treat with doxycycline but no fever, leukocytosis or other SIRS criteria.   Treated for complex migraine with IVF, reglan, toradol and subsequent haldol. Able to ambulate. Feeling improved headache and back at baseline mental status but sleepy. Will observe in ED until more awake. ? Potential sz vs atypical migraine HA (which is  more likely). Will given neuro follow-up as outpatient. Will treat for CAP w/ doxycycline as outpatient.   Final Clinical Impressions(s) / ED Diagnoses   Final diagnoses:  Other migraine without status migrainosus, not intractable  CAP (community acquired pneumonia)    New Prescriptions New Prescriptions   No medications on file     Forde Dandy, MD 10/28/15 [phone]

## 2015-10-28 NOTE — ED Notes (Signed)
Pt walked on the hallway with steady gait, no dizziness, no c/o pain or SOB while ambulating.

## 2015-10-28 NOTE — ED Notes (Signed)
Pt assisted onto bedpan by this RN. 

## 2015-10-28 NOTE — Discharge Instructions (Addendum)
Continue ibuprofen at home for headache.   Return without fail for worsening symptoms, including fever, confusion, passing out, inability to walk, or any other symptoms concerning to you.  You are given neurology follow-up. Please do not drive or swim for next 6 months until cleared by a neurologist in case this may have been a seizure.

## 2015-10-28 NOTE — ED Notes (Signed)
Patient transported to X-ray 

## 2015-12-18 DIAGNOSIS — F32A Depression, unspecified: Secondary | ICD-10-CM

## 2015-12-24 ENCOUNTER — Ambulatory Visit: Payer: Managed Care, Other (non HMO) | Admitting: Neurology

## 2015-12-24 DIAGNOSIS — Z029 Encounter for administrative examinations, unspecified: Secondary | ICD-10-CM

## 2015-12-31 ENCOUNTER — Ambulatory Visit: Payer: Managed Care, Other (non HMO) | Admitting: Neurology

## 2016-06-25 ENCOUNTER — Emergency Department (HOSPITAL_COMMUNITY)
Admission: EM | Admit: 2016-06-25 | Discharge: 2016-06-25 | Disposition: A | Discharge status: Home / Self Care | Payer: Managed Care, Other (non HMO) | Attending: Emergency Medicine | Admitting: Emergency Medicine

## 2016-06-25 ENCOUNTER — Emergency Department (HOSPITAL_COMMUNITY): Payer: Managed Care, Other (non HMO)

## 2016-06-25 DIAGNOSIS — F17210 Nicotine dependence, cigarettes, uncomplicated: Secondary | ICD-10-CM | POA: Insufficient documentation

## 2016-06-25 DIAGNOSIS — R55 Syncope and collapse: Secondary | ICD-10-CM | POA: Insufficient documentation

## 2016-06-25 LAB — CBC WITH DIFFERENTIAL/PLATELET
BASOS ABS: 0 10*3/uL (ref 0.0–0.1)
BASOS PCT: 0 %
EOS ABS: 0.1 10*3/uL (ref 0.0–0.7)
EOS PCT: 3 %
HCT: 36.3 % (ref 36.0–46.0)
Hemoglobin: 12.2 g/dL (ref 12.0–15.0)
LYMPHS ABS: 2.7 10*3/uL (ref 0.7–4.0)
Lymphocytes Relative: 56 %
MCH: 25.7 pg — ABNORMAL LOW (ref 26.0–34.0)
MCHC: 33.6 g/dL (ref 30.0–36.0)
MCV: 76.6 fL — AB (ref 78.0–100.0)
Monocytes Absolute: 0.3 10*3/uL (ref 0.1–1.0)
Monocytes Relative: 6 %
Neutro Abs: 1.7 10*3/uL (ref 1.7–7.7)
Neutrophils Relative %: 35 %
PLATELETS: 173 10*3/uL (ref 150–400)
RBC: 4.74 MIL/uL (ref 3.87–5.11)
RDW: 14.5 % (ref 11.5–15.5)
WBC: 4.8 10*3/uL (ref 4.0–10.5)

## 2016-06-25 LAB — CBG MONITORING, ED: Glucose-Capillary: 128 mg/dL — ABNORMAL HIGH (ref 65–99)

## 2016-06-25 LAB — COMPREHENSIVE METABOLIC PANEL
ALK PHOS: 102 U/L (ref 38–126)
ALT: 30 U/L (ref 14–54)
AST: 25 U/L (ref 15–41)
Albumin: 3.8 g/dL (ref 3.5–5.0)
Anion gap: 11 (ref 5–15)
BUN: 12 mg/dL (ref 6–20)
CALCIUM: 9 mg/dL (ref 8.9–10.3)
CO2: 21 mmol/L — AB (ref 22–32)
CREATININE: 0.76 mg/dL (ref 0.44–1.00)
Chloride: 107 mmol/L (ref 101–111)
Glucose, Bld: 131 mg/dL — ABNORMAL HIGH (ref 65–99)
Potassium: 3.3 mmol/L — ABNORMAL LOW (ref 3.5–5.1)
SODIUM: 139 mmol/L (ref 135–145)
Total Bilirubin: 0.3 mg/dL (ref 0.3–1.2)
Total Protein: 6.7 g/dL (ref 6.5–8.1)

## 2016-06-25 LAB — TROPONIN I: Troponin I: <0.03 ng/mL (ref <0.03)

## 2016-06-25 LAB — I-STAT BETA HCG BLOOD, ED (MC, WL, AP ONLY)

## 2016-06-25 NOTE — ED Provider Notes (Signed)
[facility] DEPT Provider Note   CSN: [id number] Arrival date & time: 06/25/16  6948     History   Chief Complaint Chief Complaint  Patient presents with  . Loss of Consciousness  . Chest Pain    HPI Heather Figueroa is a 46 y.o. female.  HPI  Patient presents after an episode of syncope. Patient notes audible medical issues including ongoing therapy for gait instability. Today, the patient was engaging in physical therapy, felt flushed, hot, had an episode of syncope. There is no chest pain prior to, or after the event. Currently the patient is awake and alert, answering questions properly. She also complained of worsening right shoulder pain Pain is more severe, more diffuse than it has been, though she has had shoulder pain for some time, has been working with her orthopedist on this issue. Patient has a history of migraines, had a neurologic event last fall, but has no history of coronary disease.    No past medical history on file.  There are no active problems to display for this patient.   No past surgical history on file.  OB History    No data available       Home Medications    Prior to Admission medications   Medication Sig Start Date End Date Taking? Authorizing Provider  doxycycline (VIBRAMYCIN) 100 MG capsule Take 1 capsule (100 mg total) by mouth 2 (two) times daily. 10/28/15   Jola Schmidt, MD  traZODone (DESYREL) 50 MG tablet Take 50-100 mg by mouth at bedtime as needed for sleep.  08/15/15   Historical Provider, MD  venlafaxine XR (EFFEXOR-XR) 150 MG 24 hr capsule Take 150 mg by mouth every morning. 10/02/15   Historical Provider, MD    Family History No family history on file.  Social History Social History  Substance Use Topics  . Smoking status: Current Every Day Smoker    Packs/day: 1.00    Types: Cigarettes  . Smokeless tobacco: Not on file  . Alcohol use No     Allergies   Percocet [oxycodone-acetaminophen] and Tylenol  [acetaminophen]   Review of Systems Review of Systems  Constitutional:       Per HPI, otherwise negative  HENT:       Per HPI, otherwise negative  Respiratory:       Per HPI, otherwise negative  Cardiovascular:       Per HPI, otherwise negative  Gastrointestinal: Negative for vomiting.  Endocrine:       Negative aside from HPI  Genitourinary:       Neg aside from HPI   Musculoskeletal:       Per HPI, otherwise negative  Skin: Negative.   Neurological: Positive for syncope and weakness.     Physical Exam Updated Vital Signs BP 110/77 (BP Location: Right Arm)   Pulse 75   Temp 97.6 F (36.4 C) (Oral)   Resp 17   SpO2 100%   Physical Exam  Constitutional: She is oriented to person, place, and time. She appears well-developed and well-nourished. No distress.  HENT:  Head: Normocephalic and atraumatic.  Eyes: Conjunctivae and EOM are normal.  Cardiovascular: Normal rate and regular rhythm.   Pulmonary/Chest: Effort normal and breath sounds normal. No stridor. No respiratory distress.  Abdominal: She exhibits no distension.  Musculoskeletal: She exhibits no edema.  Right shoulder grossly unremarkable although the patient describes pain throughout the  Neurological: She is alert and oriented to person, place, and time. She displays atrophy. No cranial  nerve deficit. She displays no seizure activity. Coordination normal.  Skin: Skin is warm and dry.  Psychiatric: She has a normal mood and affect.  Nursing note and vitals reviewed.    ED Treatments / Results  Labs (all labs ordered are listed, but only abnormal results are displayed) Labs Reviewed  COMPREHENSIVE METABOLIC PANEL - Abnormal; Notable for the following:       Result Value   Potassium 3.3 (*)    CO2 21 (*)    Glucose, Bld 131 (*)    All other components within normal limits  CBC WITH DIFFERENTIAL/PLATELET - Abnormal; Notable for the following:    MCV 76.6 (*)    MCH 25.7 (*)    All other components  within normal limits  CBG MONITORING, ED - Abnormal; Notable for the following:    Glucose-Capillary 128 (*)    All other components within normal limits  TROPONIN I  I-STAT BETA HCG BLOOD, ED ([facility], [facility], [facility] ONLY)    EKG  EKG Interpretation  Date/Time:  Friday Jun 25 2016 08:47:30 EDT Ventricular Rate:  78 PR Interval:    QRS Duration: 96 QT Interval:  388 QTC Calculation: 442 R Axis:   24 Text Interpretation:  Sinus rhythm Anterior infarct, old No significant change since last tracing Abnormal ekg Confirmed by Carmin Muskrat  MD [phone]) on 06/25/2016 9:10:09 AM       Radiology Dg Chest 2 View  Result Date: 06/25/2016 CLINICAL DATA:  Syncope, weakness EXAM: CHEST  2 VIEW COMPARISON:  10/28/2015 FINDINGS: Heart and mediastinal contours are within normal limits. No focal opacities or effusions. No acute bony abnormality. IMPRESSION: No active cardiopulmonary disease. Electronically Signed   By: Rolm Baptise M.D.   On: 06/25/2016 09:21    Procedures Procedures (including critical care time)    Initial Impression / Assessment and Plan / ED Course  I have reviewed the triage vital signs and the nursing notes.  Pertinent labs & imaging results that were available during my care of the patient were reviewed by me and considered in my medical decision making (see chart for details).  10:46 AM Patient now accompanied by a companion. Together we discussed all findings, reassuring results, absence of evidence for ACS, PE, pneumonia, CVA. We had a lengthy conversation about the patient's ongoing shoulder pain, and the patient will be provided referral for a second opinion for her ongoing likely neurologic in etiology shoulder issues. They both now states that the patient is working with multiple neurologists given her new gait instability, but otherwise reassuring findings. They will follow up with neurologist as an outpatient.   Final Clinical Impressions(s) / ED Diagnoses  Syncope  collapse Shoulder pain, right   Carmin Muskrat, MD 06/25/16 1046

## 2016-06-25 NOTE — Discharge Instructions (Signed)
As discussed, your evaluation today has been largely reassuring.  But, it is important that you monitor your condition carefully, and do not hesitate to return to the ED if you develop new, or concerning changes in your condition. ? ?Otherwise, please follow-up with your physician for appropriate ongoing care. ? ?

## 2016-06-25 NOTE — ED Triage Notes (Signed)
Pt arrives via POV from home with chest pain and tightness beginning this AM and syncopal episode while standing. Pt alert, oriented x4, VSS.

## 2016-07-22 ENCOUNTER — Emergency Department (HOSPITAL_COMMUNITY)
Admission: EM | Admit: 2016-07-22 | Discharge: 2016-07-22 | Disposition: A | Discharge status: Home / Self Care | Payer: Managed Care, Other (non HMO) | Attending: Emergency Medicine | Admitting: Emergency Medicine

## 2016-07-22 ENCOUNTER — Encounter (HOSPITAL_COMMUNITY): Payer: Self-pay | Admitting: Emergency Medicine

## 2016-07-22 DIAGNOSIS — Z5321 Procedure and treatment not carried out due to patient leaving prior to being seen by health care provider: Secondary | ICD-10-CM | POA: Insufficient documentation

## 2016-07-22 DIAGNOSIS — G43909 Migraine, unspecified, not intractable, without status migrainosus: Secondary | ICD-10-CM | POA: Insufficient documentation

## 2016-07-22 LAB — BASIC METABOLIC PANEL WITH GFR
Anion gap: 6 (ref 5–15)
BUN: 10 mg/dL (ref 6–20)
CO2: 25 mmol/L (ref 22–32)
Calcium: 9.1 mg/dL (ref 8.9–10.3)
Chloride: 107 mmol/L (ref 101–111)
Creatinine, Ser: 0.74 mg/dL (ref 0.44–1.00)
GFR calc Af Amer: >60 mL/min (ref >60)
GFR calc non Af Amer: >60 mL/min (ref >60)
Glucose, Bld: 121 mg/dL — ABNORMAL HIGH (ref 65–99)
Potassium: 3.5 mmol/L (ref 3.5–5.1)
Sodium: 138 mmol/L (ref 135–145)

## 2016-07-22 LAB — CBC
HEMATOCRIT: 38.5 % (ref 36.0–46.0)
Hemoglobin: 12.6 g/dL (ref 12.0–15.0)
MCH: 25.1 pg — AB (ref 26.0–34.0)
MCHC: 32.7 g/dL (ref 30.0–36.0)
MCV: 76.7 fL — ABNORMAL LOW (ref 78.0–100.0)
PLATELETS: 201 10*3/uL (ref 150–400)
RBC: 5.02 MIL/uL (ref 3.87–5.11)
RDW: 14.9 % (ref 11.5–15.5)
WBC: 7.2 10*3/uL (ref 4.0–10.5)

## 2016-07-22 LAB — I-STAT TROPONIN, ED: Troponin i, poc: 0 ng/mL (ref 0.00–0.08)

## 2016-07-22 MED ORDER — ONDANSETRON 4 MG PO TBDP
ORAL_TABLET | ORAL | Status: AC
  Filled 2016-07-22: qty 1

## 2016-07-22 NOTE — ED Notes (Signed)
Pt given zofran

## 2016-07-22 NOTE — ED Notes (Signed)
Pt.stated she feels better and she wants to leave .

## 2016-07-22 NOTE — ED Triage Notes (Signed)
BIB EMS from home, pt reports she "doesnt know what happened" she had a migraine all day with episode of emesis. Pt also endorses CP. Multiple complaints. Hx migraine and seen by a neurologist. Pt reports being under stress and gf is leaving country tomorrow. Last time episode like this happened pt gf was also leaving the country.Marland KitchenMarland Kitchen

## 2016-07-22 NOTE — ED Notes (Signed)
Emesis x 2 in waiting room, per family pt has same s/s just prior to seizure activity. Will continue to monitor.

## 2017-01-10 ENCOUNTER — Other Ambulatory Visit: Payer: Self-pay | Admitting: Neurosurgery

## 2017-01-20 NOTE — Progress Notes (Addendum)
PCP: Dr. Aura Dials   Cardiologist: pt denies  EKG: 07/22/16 in EPIC  Stress test: pt denies  ECHO: pt denies ever  Cardiac Cath: pt denies ever  Chest x-ray:06/25/16

## 2017-01-20 NOTE — Pre-Procedure Instructions (Signed)
SHRITHA BRESEE  01/20/2017      [facility] [place], [place] - [address] [place] [address] Phone: [phone] Fax: [phone]    Your procedure is scheduled on January 26, 2017.  Report to [facility] Admitting at 1045 AM.  Call this number if you have problems the morning of surgery:  [phone]   Remember:  Do not eat food or drink liquids after midnight.  Take these medicines the morning of surgery with A SIP OF WATER lamotrigine (lamictal), prazosin (minipress), vilazodone (viibyrd).   Do not wear jewelry, make-up or nail polish.  Do not wear lotions, powders, or perfumes, or deoderant.  Do not shave 48 hours prior to surgery.    Do not bring valuables to the hospital.  [facility] is not responsible for any belongings or valuables.  Contacts, dentures or bridgework may not be worn into surgery.  Leave your suitcase in the car.  After surgery it may be brought to your room.  For patients admitted to the hospital, discharge time will be determined by your treatment team.  Patients discharged the day of surgery will not be allowed to drive home.   Special instructions:   [facility]- Preparing For Surgery  Before surgery, you can play an important role. Because skin is not sterile, your skin needs to be as free of germs as possible. You can reduce the number of germs on your skin by washing with CHG (chlorahexidine gluconate) Soap before surgery.  CHG is an antiseptic cleaner which kills germs and bonds with the skin to continue killing germs even after washing.  Please do not use if you have an allergy to CHG or antibacterial soaps. If your skin becomes reddened/irritated stop using the CHG.  Do not shave (including legs and underarms) for at least 48 hours prior to first CHG shower. It is OK to shave your face.  Please follow these instructions carefully.   1. Shower the NIGHT BEFORE SURGERY and  the MORNING OF SURGERY with CHG.   2. If you chose to wash your hair, wash your hair first as usual with your normal shampoo.  3. After you shampoo, rinse your hair and body thoroughly to remove the shampoo.  4. Use CHG as you would any other liquid soap. You can apply CHG directly to the skin and wash gently with a scrungie or a clean washcloth.   5. Apply the CHG Soap to your body ONLY FROM THE NECK DOWN.  Do not use on open wounds or open sores. Avoid contact with your eyes, ears, mouth and genitals (private parts). Wash Face and genitals (private parts)  with your normal soap.  6. Wash thoroughly, paying special attention to the area where your surgery will be performed.  7. Thoroughly rinse your body with warm water from the neck down.  8. DO NOT shower/wash with your normal soap after using and rinsing off the CHG Soap.  9. Pat yourself dry with a CLEAN TOWEL.  10. Wear CLEAN PAJAMAS to bed the night before surgery, wear comfortable clothes the morning of surgery  11. Place CLEAN SHEETS on your bed the night of your first shower and DO NOT SLEEP WITH PETS.    Day of Surgery: Do not apply any deodorants/lotions. Please wear clean clothes to the hospital/surgery center.     Please read over the following fact sheets that you were given. Pain Booklet, Coughing and Deep  Breathing and Surgical Site Infection Prevention

## 2017-01-21 ENCOUNTER — Encounter (HOSPITAL_COMMUNITY): Payer: Self-pay

## 2017-01-21 ENCOUNTER — Other Ambulatory Visit: Payer: Self-pay

## 2017-01-21 ENCOUNTER — Encounter (HOSPITAL_COMMUNITY)
Admission: RE | Admit: 2017-01-21 | Discharge: 2017-01-21 | Disposition: A | Discharge status: Home / Self Care | Payer: Managed Care, Other (non HMO) | Source: Ambulatory Visit | Attending: Neurosurgery | Admitting: Neurosurgery

## 2017-01-21 DIAGNOSIS — Z01812 Encounter for preprocedural laboratory examination: Secondary | ICD-10-CM | POA: Insufficient documentation

## 2017-01-21 DIAGNOSIS — M4722 Other spondylosis with radiculopathy, cervical region: Secondary | ICD-10-CM | POA: Insufficient documentation

## 2017-01-21 DIAGNOSIS — M4712 Other spondylosis with myelopathy, cervical region: Secondary | ICD-10-CM | POA: Diagnosis not present

## 2017-01-21 HISTORY — DX: Headache: R51

## 2017-01-21 HISTORY — DX: Anxiety disorder, unspecified: F41.9

## 2017-01-21 HISTORY — DX: Unspecified osteoarthritis, unspecified site: M19.90

## 2017-01-21 HISTORY — DX: Headache, unspecified: R51.9

## 2017-01-21 HISTORY — DX: Depression, unspecified: F32.A

## 2017-01-21 HISTORY — DX: Unspecified convulsions: R56.9

## 2017-01-21 HISTORY — DX: Myoneural disorder, unspecified: G70.9

## 2017-01-21 HISTORY — DX: Major depressive disorder, single episode, unspecified: F32.9

## 2017-01-21 HISTORY — DX: Cerebral infarction, unspecified: I63.9

## 2017-01-21 HISTORY — DX: Anemia, unspecified: D64.9

## 2017-01-21 LAB — TYPE AND SCREEN
ABO/RH(D): B POS
ANTIBODY SCREEN: NEGATIVE

## 2017-01-21 LAB — CBC
HCT: 38.7 % (ref 36.0–46.0)
HEMOGLOBIN: 12.9 g/dL (ref 12.0–15.0)
MCH: 25.3 pg — ABNORMAL LOW (ref 26.0–34.0)
MCHC: 33.3 g/dL (ref 30.0–36.0)
MCV: 75.9 fL — ABNORMAL LOW (ref 78.0–100.0)
PLATELETS: 207 10*3/uL (ref 150–400)
RBC: 5.1 MIL/uL (ref 3.87–5.11)
RDW: 14.4 % (ref 11.5–15.5)
WBC: 6.2 10*3/uL (ref 4.0–10.5)

## 2017-01-21 LAB — BASIC METABOLIC PANEL
ANION GAP: 8 (ref 5–15)
BUN: 9 mg/dL (ref 6–20)
CALCIUM: 9.3 mg/dL (ref 8.9–10.3)
CO2: 24 mmol/L (ref 22–32)
CREATININE: 0.65 mg/dL (ref 0.44–1.00)
Chloride: 105 mmol/L (ref 101–111)
Glucose, Bld: 104 mg/dL — ABNORMAL HIGH (ref 65–99)
Potassium: 3.2 mmol/L — ABNORMAL LOW (ref 3.5–5.1)
SODIUM: 137 mmol/L (ref 135–145)

## 2017-01-21 LAB — SURGICAL PCR SCREEN
MRSA, PCR: NEGATIVE
STAPHYLOCOCCUS AUREUS: NEGATIVE

## 2017-01-21 LAB — ABO/RH: ABO/RH(D): B POS

## 2017-01-24 NOTE — Progress Notes (Signed)
Anesthesia Chart Review:  Pt is a 46 year old female scheduled for C4-5, C5-6, C6-7 ACDF on 01/26/2017 with Newman Pies, MD  PMH includes:  TIA (08/2016), seizures (x2 08/2016), anemia. Current smoker. BMI 29.5  Medications include: prazosin  BP 137/90   Pulse 88   Temp 36.6 C   Resp 20   Ht 5\' 1"  (1.549 m)   Wt 155 lb 9.6 oz (70.6 kg)   SpO2 96%   BMI 29.40 kg/m   Preoperative labs reviewed.    CXR 06/25/16: No active cardiopulmonary disease.  EKG 07/22/16: NSR. Anterior infarct, age undetermined. Poor R wave progression present on EKG since at least 2013.   If no changes, I anticipate pt can proceed with surgery as scheduled.   Willeen Cass, FNP-BC [facility]/Anesthesiology Phone: [phone] 01/24/2017 10:13 AM

## 2017-01-26 ENCOUNTER — Encounter (HOSPITAL_COMMUNITY): Payer: Self-pay | Admitting: Surgery

## 2017-01-26 ENCOUNTER — Inpatient Hospital Stay (HOSPITAL_COMMUNITY): Payer: Managed Care, Other (non HMO)

## 2017-01-26 ENCOUNTER — Encounter (HOSPITAL_COMMUNITY)
Admission: RE | Disposition: A | Discharge status: Home Health | Payer: Self-pay | Source: Ambulatory Visit | Attending: Neurosurgery

## 2017-01-26 ENCOUNTER — Other Ambulatory Visit: Payer: Self-pay

## 2017-01-26 ENCOUNTER — Inpatient Hospital Stay (HOSPITAL_COMMUNITY): Payer: Managed Care, Other (non HMO) | Admitting: Anesthesiology

## 2017-01-26 ENCOUNTER — Inpatient Hospital Stay (HOSPITAL_COMMUNITY): Payer: Managed Care, Other (non HMO) | Admitting: Emergency Medicine

## 2017-01-26 ENCOUNTER — Inpatient Hospital Stay (HOSPITAL_COMMUNITY)
Admission: RE | Admit: 2017-01-26 | Discharge: 2017-01-28 | DRG: 472 | Disposition: A | Discharge status: Home Health | Payer: Managed Care, Other (non HMO) | Source: Ambulatory Visit | Attending: Neurosurgery | Admitting: Neurosurgery

## 2017-01-26 DIAGNOSIS — Z8673 Personal history of transient ischemic attack (TIA), and cerebral infarction without residual deficits: Secondary | ICD-10-CM

## 2017-01-26 DIAGNOSIS — Z419 Encounter for procedure for purposes other than remedying health state, unspecified: Secondary | ICD-10-CM

## 2017-01-26 DIAGNOSIS — M50121 Cervical disc disorder at C4-C5 level with radiculopathy: Secondary | ICD-10-CM | POA: Diagnosis present

## 2017-01-26 DIAGNOSIS — F419 Anxiety disorder, unspecified: Secondary | ICD-10-CM | POA: Diagnosis present

## 2017-01-26 DIAGNOSIS — M4722 Other spondylosis with radiculopathy, cervical region: Secondary | ICD-10-CM

## 2017-01-26 DIAGNOSIS — Z79899 Other long term (current) drug therapy: Secondary | ICD-10-CM

## 2017-01-26 DIAGNOSIS — M40202 Unspecified kyphosis, cervical region: Secondary | ICD-10-CM | POA: Diagnosis present

## 2017-01-26 DIAGNOSIS — M50021 Cervical disc disorder at C4-C5 level with myelopathy: Secondary | ICD-10-CM | POA: Diagnosis present

## 2017-01-26 DIAGNOSIS — M50122 Cervical disc disorder at C5-C6 level with radiculopathy: Secondary | ICD-10-CM | POA: Diagnosis present

## 2017-01-26 DIAGNOSIS — F329 Major depressive disorder, single episode, unspecified: Secondary | ICD-10-CM | POA: Diagnosis present

## 2017-01-26 DIAGNOSIS — Z90710 Acquired absence of both cervix and uterus: Secondary | ICD-10-CM

## 2017-01-26 DIAGNOSIS — M4802 Spinal stenosis, cervical region: Principal | ICD-10-CM | POA: Diagnosis present

## 2017-01-26 DIAGNOSIS — F17210 Nicotine dependence, cigarettes, uncomplicated: Secondary | ICD-10-CM | POA: Diagnosis present

## 2017-01-26 DIAGNOSIS — M4712 Other spondylosis with myelopathy, cervical region: Secondary | ICD-10-CM | POA: Diagnosis present

## 2017-01-26 DIAGNOSIS — M50022 Cervical disc disorder at C5-C6 level with myelopathy: Secondary | ICD-10-CM | POA: Diagnosis present

## 2017-01-26 HISTORY — PX: ANTERIOR CERVICAL DECOMP/DISCECTOMY FUSION: SHX1161

## 2017-01-26 SURGERY — ANTERIOR CERVICAL DECOMPRESSION/DISCECTOMY FUSION 3 LEVELS
Anesthesia: General

## 2017-01-26 MED ORDER — PROPOFOL 10 MG/ML IV BOLUS
INTRAVENOUS | Status: DC | PRN
  Administered 2017-01-26: 130 mg via INTRAVENOUS

## 2017-01-26 MED ORDER — BACITRACIN ZINC 500 UNIT/GM EX OINT
TOPICAL_OINTMENT | CUTANEOUS | Status: AC
  Filled 2017-01-26: qty 28.35

## 2017-01-26 MED ORDER — THROMBIN (RECOMBINANT) 20000 UNITS EX SOLR
CUTANEOUS | Status: DC | PRN
  Administered 2017-01-26: 14:00:00 via TOPICAL

## 2017-01-26 MED ORDER — ROCURONIUM BROMIDE 10 MG/ML (PF) SYRINGE
PREFILLED_SYRINGE | INTRAVENOUS | Status: DC | PRN
  Administered 2017-01-26: 50 mg via INTRAVENOUS

## 2017-01-26 MED ORDER — ALUM & MAG HYDROXIDE-SIMETH 200-200-20 MG/5ML PO SUSP: 30.0000 mL | Freq: Four times a day (QID) | ORAL | Status: DC | PRN

## 2017-01-26 MED ORDER — DEXAMETHASONE SODIUM PHOSPHATE 4 MG/ML IJ SOLN
4.0000 mg | Freq: Four times a day (QID) | INTRAMUSCULAR | Status: AC
  Administered 2017-01-26 – 2017-01-27 (×3): 4 mg via INTRAVENOUS
  Filled 2017-01-26 (×3): qty 1

## 2017-01-26 MED ORDER — FENTANYL CITRATE (PF) 100 MCG/2ML IJ SOLN
INTRAMUSCULAR | Status: DC | PRN
  Administered 2017-01-26 (×3): 50 ug via INTRAVENOUS
  Administered 2017-01-26: 100 ug via INTRAVENOUS

## 2017-01-26 MED ORDER — ROCURONIUM BROMIDE 10 MG/ML (PF) SYRINGE
PREFILLED_SYRINGE | INTRAVENOUS | Status: AC
  Filled 2017-01-26: qty 5

## 2017-01-26 MED ORDER — DEXAMETHASONE 4 MG PO TABS: 4.0000 mg | ORAL_TABLET | Freq: Four times a day (QID) | ORAL | Status: AC

## 2017-01-26 MED ORDER — CHLORHEXIDINE GLUCONATE CLOTH 2 % EX PADS: 6.0000 | MEDICATED_PAD | Freq: Once | CUTANEOUS | Status: DC

## 2017-01-26 MED ORDER — ONDANSETRON HCL 4 MG/2ML IJ SOLN
4.0000 mg | Freq: Once | INTRAMUSCULAR | Status: AC
  Administered 2017-01-26: 4 mg via INTRAVENOUS
  Filled 2017-01-26: qty 2

## 2017-01-26 MED ORDER — LACTATED RINGERS IV SOLN: INTRAVENOUS | Status: DC

## 2017-01-26 MED ORDER — DOCUSATE SODIUM 100 MG PO CAPS
100.0000 mg | ORAL_CAPSULE | Freq: Two times a day (BID) | ORAL | Status: DC
  Administered 2017-01-26 – 2017-01-28 (×4): 100 mg via ORAL
  Filled 2017-01-26 (×4): qty 1

## 2017-01-26 MED ORDER — BISACODYL 10 MG RE SUPP: 10.0000 mg | Freq: Every day | RECTAL | Status: DC | PRN

## 2017-01-26 MED ORDER — THROMBIN (RECOMBINANT) 5000 UNITS EX SOLR
CUTANEOUS | Status: AC
  Filled 2017-01-26: qty 5000

## 2017-01-26 MED ORDER — ZOLPIDEM TARTRATE 5 MG PO TABS: 5.0000 mg | ORAL_TABLET | Freq: Every evening | ORAL | Status: DC | PRN

## 2017-01-26 MED ORDER — THROMBIN (RECOMBINANT) 5000 UNITS EX SOLR
OROMUCOSAL | Status: DC | PRN
  Administered 2017-01-26 (×2): via TOPICAL

## 2017-01-26 MED ORDER — LAMOTRIGINE 100 MG PO TABS
100.0000 mg | ORAL_TABLET | Freq: Every day | ORAL | Status: DC
  Administered 2017-01-27 – 2017-01-28 (×2): 100 mg via ORAL
  Filled 2017-01-26 (×2): qty 1

## 2017-01-26 MED ORDER — MORPHINE SULFATE (PF) 4 MG/ML IV SOLN: 4.0000 mg | INTRAVENOUS | Status: DC | PRN

## 2017-01-26 MED ORDER — BACITRACIN 50000 UNITS IM SOLR
INTRAMUSCULAR | Status: DC | PRN
  Administered 2017-01-26: 14:00:00

## 2017-01-26 MED ORDER — ONDANSETRON HCL 4 MG/2ML IJ SOLN: 4.0000 mg | Freq: Four times a day (QID) | INTRAMUSCULAR | Status: DC | PRN

## 2017-01-26 MED ORDER — PHENYLEPHRINE HCL 10 MG/ML IJ SOLN
INTRAVENOUS | Status: DC | PRN
  Administered 2017-01-26: 25 ug/min via INTRAVENOUS

## 2017-01-26 MED ORDER — HYDROMORPHONE HCL 1 MG/ML IJ SOLN
INTRAMUSCULAR | Status: AC
  Administered 2017-01-26: 0.25 mg via INTRAVENOUS
  Filled 2017-01-26: qty 1

## 2017-01-26 MED ORDER — CEFAZOLIN SODIUM-DEXTROSE 2-4 GM/100ML-% IV SOLN
2.0000 g | Freq: Three times a day (TID) | INTRAVENOUS | Status: AC
  Administered 2017-01-26 – 2017-01-27 (×2): 2 g via INTRAVENOUS
  Filled 2017-01-26 (×2): qty 100

## 2017-01-26 MED ORDER — LIDOCAINE 2% (20 MG/ML) 5 ML SYRINGE
INTRAMUSCULAR | Status: AC
  Filled 2017-01-26: qty 5

## 2017-01-26 MED ORDER — LACTATED RINGERS IV SOLN
INTRAVENOUS | Status: DC
Start: 2017-01-26 — End: 2017-01-28
  Administered 2017-01-26 (×3): via INTRAVENOUS

## 2017-01-26 MED ORDER — HYDROMORPHONE HCL 1 MG/ML IJ SOLN
0.2500 mg | INTRAMUSCULAR | Status: DC | PRN
  Administered 2017-01-26: 0.25 mg via INTRAVENOUS
  Administered 2017-01-26: 0.5 mg via INTRAVENOUS

## 2017-01-26 MED ORDER — SUGAMMADEX SODIUM 200 MG/2ML IV SOLN
INTRAVENOUS | Status: DC | PRN
  Administered 2017-01-26: 140 mg via INTRAVENOUS

## 2017-01-26 MED ORDER — VILAZODONE HCL 40 MG PO TABS
40.0000 mg | ORAL_TABLET | Freq: Every day | ORAL | Status: DC
  Administered 2017-01-27 – 2017-01-28 (×2): 40 mg via ORAL
  Filled 2017-01-26 (×2): qty 1

## 2017-01-26 MED ORDER — MIDAZOLAM HCL 5 MG/5ML IJ SOLN
INTRAMUSCULAR | Status: DC | PRN
  Administered 2017-01-26 (×2): 1 mg via INTRAVENOUS

## 2017-01-26 MED ORDER — HYDROMORPHONE HCL 2 MG PO TABS: 4.0000 mg | ORAL_TABLET | ORAL | Status: DC | PRN

## 2017-01-26 MED ORDER — THROMBIN (RECOMBINANT) 20000 UNITS EX SOLR
CUTANEOUS | Status: AC
  Filled 2017-01-26: qty 20000

## 2017-01-26 MED ORDER — ONDANSETRON HCL 4 MG/2ML IJ SOLN
INTRAMUSCULAR | Status: DC | PRN
  Administered 2017-01-26: 4 mg via INTRAVENOUS

## 2017-01-26 MED ORDER — PRAZOSIN HCL 2 MG PO CAPS
5.0000 mg | ORAL_CAPSULE | Freq: Every day | ORAL | Status: DC
  Administered 2017-01-26 – 2017-01-27 (×2): 5 mg via ORAL
  Filled 2017-01-26 (×2): qty 1

## 2017-01-26 MED ORDER — HYDROMORPHONE HCL 2 MG PO TABS
2.0000 mg | ORAL_TABLET | Freq: Four times a day (QID) | ORAL | Status: DC | PRN
  Administered 2017-01-26 – 2017-01-28 (×6): 2 mg via ORAL
  Filled 2017-01-26 (×6): qty 1

## 2017-01-26 MED ORDER — DEXAMETHASONE SODIUM PHOSPHATE 10 MG/ML IJ SOLN
INTRAMUSCULAR | Status: DC | PRN
  Administered 2017-01-26: 10 mg via INTRAVENOUS

## 2017-01-26 MED ORDER — ONDANSETRON HCL 4 MG PO TABS
4.0000 mg | ORAL_TABLET | Freq: Four times a day (QID) | ORAL | Status: DC | PRN
  Administered 2017-01-27: 4 mg via ORAL
  Filled 2017-01-26: qty 1

## 2017-01-26 MED ORDER — ONDANSETRON HCL 4 MG/2ML IJ SOLN
INTRAMUSCULAR | Status: AC
  Administered 2017-01-26: 4 mg via INTRAVENOUS
  Filled 2017-01-26: qty 2

## 2017-01-26 MED ORDER — CEFAZOLIN SODIUM-DEXTROSE 2-4 GM/100ML-% IV SOLN
2.0000 g | INTRAVENOUS | Status: AC
  Administered 2017-01-26: 2 g via INTRAVENOUS

## 2017-01-26 MED ORDER — ONDANSETRON HCL 4 MG/2ML IJ SOLN
INTRAMUSCULAR | Status: AC
  Filled 2017-01-26: qty 2

## 2017-01-26 MED ORDER — BUPIVACAINE-EPINEPHRINE (PF) 0.5% -1:200000 IJ SOLN
INTRAMUSCULAR | Status: AC
  Filled 2017-01-26: qty 30

## 2017-01-26 MED ORDER — PHENOL 1.4 % MT LIQD
1.0000 | OROMUCOSAL | Status: DC | PRN
  Filled 2017-01-26: qty 177

## 2017-01-26 MED ORDER — FENTANYL CITRATE (PF) 250 MCG/5ML IJ SOLN
INTRAMUSCULAR | Status: AC
Start: 2017-01-26 — End: 2017-01-26
  Filled 2017-01-26: qty 5

## 2017-01-26 MED ORDER — CEFAZOLIN SODIUM-DEXTROSE 2-4 GM/100ML-% IV SOLN
INTRAVENOUS | Status: AC
  Filled 2017-01-26: qty 100

## 2017-01-26 MED ORDER — PROPOFOL 10 MG/ML IV BOLUS
INTRAVENOUS | Status: AC
  Filled 2017-01-26: qty 20

## 2017-01-26 MED ORDER — MIDAZOLAM HCL 2 MG/2ML IJ SOLN
INTRAMUSCULAR | Status: AC
  Filled 2017-01-26: qty 2

## 2017-01-26 MED ORDER — LIDOCAINE 2% (20 MG/ML) 5 ML SYRINGE
INTRAMUSCULAR | Status: DC | PRN
  Administered 2017-01-26: 60 mg via INTRAVENOUS

## 2017-01-26 MED ORDER — DEXAMETHASONE SODIUM PHOSPHATE 10 MG/ML IJ SOLN
INTRAMUSCULAR | Status: AC
  Filled 2017-01-26: qty 1

## 2017-01-26 MED ORDER — BACITRACIN ZINC 500 UNIT/GM EX OINT
TOPICAL_OINTMENT | CUTANEOUS | Status: DC | PRN
  Administered 2017-01-26: 1 via TOPICAL

## 2017-01-26 MED ORDER — LACTATED RINGERS IV SOLN
INTRAVENOUS | Status: DC | PRN
  Administered 2017-01-26: 13:00:00 via INTRAVENOUS

## 2017-01-26 MED ORDER — MENTHOL 3 MG MT LOZG: 1.0000 | LOZENGE | OROMUCOSAL | Status: DC | PRN

## 2017-01-26 MED ORDER — BUPIVACAINE-EPINEPHRINE (PF) 0.5% -1:200000 IJ SOLN
INTRAMUSCULAR | Status: DC | PRN
  Administered 2017-01-26: 10 mL via PERINEURAL

## 2017-01-26 MED ORDER — SUGAMMADEX SODIUM 200 MG/2ML IV SOLN
INTRAVENOUS | Status: AC
  Filled 2017-01-26: qty 2

## 2017-01-26 SURGICAL SUPPLY — 62 items
BAG DECANTER FOR FLEXI CONT (MISCELLANEOUS) ×3 IMPLANT
BENZOIN TINCTURE PRP APPL 2/3 (GAUZE/BANDAGES/DRESSINGS) ×3 IMPLANT
BIT DRILL NEURO 2X3.1 SFT TUCH (MISCELLANEOUS) ×1 IMPLANT
BIT DRILL SPINE QC 12 (BIT) ×3 IMPLANT
BLADE SURG 15 STRL LF DISP TIS (BLADE) ×1 IMPLANT
BLADE SURG 15 STRL SS (BLADE) ×2
BLADE ULTRA TIP 2M (BLADE) ×3 IMPLANT
BUR BARREL STRAIGHT FLUTE 4.0 (BURR) ×3 IMPLANT
BUR MATCHSTICK NEURO 3.0 LAGG (BURR) ×3 IMPLANT
CAGE PEEK VISTAS 11X14X6 (Cage) ×6 IMPLANT
CANISTER SUCT 3000ML PPV (MISCELLANEOUS) ×3 IMPLANT
CARTRIDGE OIL MAESTRO DRILL (MISCELLANEOUS) ×1 IMPLANT
CLOSURE WOUND 1/2 X4 (GAUZE/BANDAGES/DRESSINGS) ×1
COVER MAYO STAND STRL (DRAPES) ×3 IMPLANT
DIFFUSER DRILL AIR PNEUMATIC (MISCELLANEOUS) ×3 IMPLANT
DRAPE HALF SHEET 40X57 (DRAPES) ×6 IMPLANT
DRAPE LAPAROTOMY 100X72 PEDS (DRAPES) ×3 IMPLANT
DRAPE MICROSCOPE LEICA (MISCELLANEOUS) ×3 IMPLANT
DRAPE POUCH INSTRU U-SHP 10X18 (DRAPES) ×3 IMPLANT
DRAPE SURG 17X23 STRL (DRAPES) ×6 IMPLANT
DRILL NEURO 2X3.1 SOFT TOUCH (MISCELLANEOUS) ×3
ELECT REM PT RETURN 9FT ADLT (ELECTROSURGICAL) ×3
ELECTRODE REM PT RTRN 9FT ADLT (ELECTROSURGICAL) ×1 IMPLANT
GAUZE SPONGE 4X4 12PLY STRL (GAUZE/BANDAGES/DRESSINGS) ×3 IMPLANT
GAUZE SPONGE 4X4 12PLY STRL LF (GAUZE/BANDAGES/DRESSINGS) ×3 IMPLANT
GAUZE SPONGE 4X4 16PLY XRAY LF (GAUZE/BANDAGES/DRESSINGS) IMPLANT
GLOVE BIO SURGEON STRL SZ8 (GLOVE) ×3 IMPLANT
GLOVE BIO SURGEON STRL SZ8.5 (GLOVE) ×3 IMPLANT
GLOVE EXAM NITRILE LRG STRL (GLOVE) IMPLANT
GLOVE EXAM NITRILE XL STR (GLOVE) IMPLANT
GLOVE EXAM NITRILE XS STR PU (GLOVE) IMPLANT
GOWN STRL REUS W/ TWL LRG LVL3 (GOWN DISPOSABLE) IMPLANT
GOWN STRL REUS W/ TWL XL LVL3 (GOWN DISPOSABLE) IMPLANT
GOWN STRL REUS W/TWL LRG LVL3 (GOWN DISPOSABLE)
GOWN STRL REUS W/TWL XL LVL3 (GOWN DISPOSABLE)
HEMOSTAT POWDER KIT SURGIFOAM (HEMOSTASIS) ×6 IMPLANT
KIT BASIN OR (CUSTOM PROCEDURE TRAY) ×3 IMPLANT
KIT ROOM TURNOVER OR (KITS) ×3 IMPLANT
MARKER SKIN DUAL TIP RULER LAB (MISCELLANEOUS) ×3 IMPLANT
NEEDLE HYPO 22GX1.5 SAFETY (NEEDLE) ×3 IMPLANT
NEEDLE SPNL 18GX3.5 QUINCKE PK (NEEDLE) ×9 IMPLANT
NS IRRIG 1000ML POUR BTL (IV SOLUTION) ×3 IMPLANT
OIL CARTRIDGE MAESTRO DRILL (MISCELLANEOUS) ×3
PACK LAMINECTOMY NEURO (CUSTOM PROCEDURE TRAY) ×3 IMPLANT
PATTIES SURGICAL .5 X.5 (GAUZE/BANDAGES/DRESSINGS) ×3 IMPLANT
PATTIES SURGICAL 1X1 (DISPOSABLE) ×3 IMPLANT
PEEK OPTIMA VISTA-S 11X14X5MM (Peek) ×3 IMPLANT
PIN DISTRACTION 14MM (PIN) ×6 IMPLANT
PLATE ANT CERV XTEND 3 LV 42 (Plate) ×3 IMPLANT
PUTTY KINEX BIOACTIVE 5CC (Bone Implant) ×3 IMPLANT
RUBBERBAND STERILE (MISCELLANEOUS) ×6 IMPLANT
SCREW FXD 4.2 XTD SELF TAP 12 (Screw) ×24 IMPLANT
SPONGE INTESTINAL PEANUT (DISPOSABLE) ×6 IMPLANT
SPONGE SURGIFOAM ABS GEL 100 (HEMOSTASIS) ×3 IMPLANT
STRIP CLOSURE SKIN 1/2X4 (GAUZE/BANDAGES/DRESSINGS) ×2 IMPLANT
SUT VIC AB 0 CT1 27 (SUTURE) ×2
SUT VIC AB 0 CT1 27XBRD ANTBC (SUTURE) ×1 IMPLANT
SUT VIC AB 3-0 SH 8-18 (SUTURE) ×3 IMPLANT
TAPE CLOTH SURG 6X10 WHT LF (GAUZE/BANDAGES/DRESSINGS) ×3 IMPLANT
TOWEL GREEN STERILE (TOWEL DISPOSABLE) ×3 IMPLANT
TOWEL GREEN STERILE FF (TOWEL DISPOSABLE) ×3 IMPLANT
WATER STERILE IRR 1000ML POUR (IV SOLUTION) ×3 IMPLANT

## 2017-01-26 NOTE — Progress Notes (Signed)
Patient ID: Heather Figueroa, female   DOB: 01-22-1971, 46 y.o.   MRN: [id number] Subjective: The patient is somnolent but arousable.  She is in no apparent distress.  Objective: Vital signs in last 24 hours: Temp:  [97.3 F (36.3 C)-98.5 F (36.9 C)] 97.3 F (36.3 C) (12/05 1649) Pulse Rate:  [83-99] 83 (12/05 1714) Resp:  [13-20] 14 (12/05 1714) BP: (142-160)/(83-109) 142/83 (12/05 1649) SpO2:  [99 %-100 %] 100 % (12/05 1714) Weight:  [70.6 kg (155 lb 9 oz)] 70.6 kg (155 lb 9 oz) (12/05 1127)  Intake/Output from previous day: No intake/output data recorded. Intake/Output this shift: Total I/O In: 1700 [I.V.:1700] Out: 725 [Urine:575; Blood:150]  Physical exam the patient is somnolent but arousable.  She is moving all 4 extremities well.  Her strength seems normal including her deltoids.  Her dressing is clean and dry.  There is no hematoma or shift.  Lab Results: No results for input(s): WBC, HGB, HCT, PLT in the last 72 hours. BMET No results for input(s): NA, K, CL, CO2, GLUCOSE, BUN, CREATININE, CALCIUM in the last 72 hours.  Studies/Results: Dg Cervical Spine 2-3 Views  Result Date: 01/26/2017 CLINICAL DATA:  Localization, C3 through C7 ACDF. EXAM: CERVICAL SPINE - 2-3 VIEW COMPARISON:  MRI of the cervical spine January 01, 2017 FINDINGS: Three intraoperative lateral radiographs of the cervical spine. Image 1: Anterior marker anterior enters ventrally at C4-5 with distal tip at C3-4. Image 2: Anterior marker indicates the ventral disc space at C3-4. Image 3: C4 through probable C7 ACDF, caudal aspect obscured by shoulder attenuation. IMPRESSION: Intraoperative radiographs demonstrating C4 through probable C7 ACDF. Electronically Signed   By: Elon Alas M.D.   On: 01/26/2017 16:43    Assessment/Plan: The patient is doing well.  I spoke with the family.  LOS: 0 days     Ophelia Charter 01/26/2017, 5:34 PM

## 2017-01-26 NOTE — Op Note (Signed)
Brief history: The patient is a 46 year old black female who has complained of neck pain with extremity weakness, gait difficulties, etc.  She was worked up with a cervical MRI which demonstrated a cervical kyphosis with herniated disks and stenosis at C4-5, C5-6 and C6-7.  I discussed the various treatment option with the patient.  She has decided to proceed with a C4-5, C5-6 and C6-7 anterior cervical discectomy, fusion and plating after weighing the risks, benefits and alternatives of surgery.  Preoperative diagnosis: C4-5, C5-6 and C6-7 herniated disc, spondylosis, stenosis, cervical myelopathy, cervical radiculopathy, cervical Kyphosis, cervicalgia  Postoperative diagnosis: Same  Procedure: C4-5, C5-6 and C6-7 anterior cervical discectomy/decompression; C4-5, C5-6 and C6-7 interbody arthrodesis with local morcellized autograft bone and Kinnex bone graft extender; insertion of interbody prosthesis at C4-5, C5-6 and C6-7 (Zimmer peek interbody prosthesis); anterior cervical plating from C4-C7 with globus titanium plate  Surgeon: Dr. Earle Gell  Asst.: Dr. Sherley Bounds  Anesthesia: Gen. endotracheal  Estimated blood loss: 150 cc  Drains: None  Complications: None  Description of procedure: The patient was brought to the operating room by the anesthesia team. General endotracheal anesthesia was induced. A roll was placed under the patient's shoulders to keep the neck in the neutral position. The patient's anterior cervical region was then prepared with Betadine scrub and Betadine solution. Sterile drapes were applied.  The area to be incised was then injected with Marcaine with epinephrine solution. I then used a scalpel to make a transverse incision in the patient's left anterior neck. I used the Metzenbaum scissors to divide the platysmal muscle and then to dissect medial to the sternocleidomastoid muscle, jugular vein, and carotid artery. I carefully dissected down towards the anterior  cervical spine identifying the esophagus and retracting it medially. Then using Kitner swabs to clear soft tissue from the anterior cervical spine. We then inserted a bent spinal needle into the upper exposed intervertebral disc space. We then obtained intraoperative radiographs confirm our location.  I then used electrocautery to detach the medial border of the longus colli muscle bilaterally from the C4-5, C5-6 and C6-7 intervertebral disc spaces. I then inserted the Caspar self-retaining retractor underneath the longus colli muscle bilaterally to provide exposure.  We then incised the intervertebral disc at C4-5. We then performed a partial intervertebral discectomy with a pituitary forceps and the Karlin curettes. I then inserted distraction screws into the vertebral bodies at C4 and C5. We then distracted the interspace. We then used the high-speed drill to decorticate the vertebral endplates at K0-9, to drill away the remainder of the intervertebral disc, to drill away some posterior spondylosis, and to thin out the posterior longitudinal ligament. I then incised ligament with the arachnoid knife. We then removed the ligament with a Kerrison punches undercutting the vertebral endplates and decompressing the thecal sac. We then performed foraminotomies about the bilateral C5 nerve roots. This completed the decompression at this level.  We then repeated this procedure in an analogous fashion at C5-6 and C6-7 decompressing the thecal sac and the bilateral C6 and C7 nerve roots.  We now turned our to attention to the interbody fusion. We used the trial spacers to determine the appropriate size for the interbody prosthesis. We then pre-filled prosthesis with a combination of local morcellized autograft bone that we obtained during decompression as well as Kinnex bone graft extender. We then inserted the prosthesis into the distracted interspace at C4-5, C5-6 and C6-7. We then removed the distraction screws.  There was a good  snug fit of the prosthesis in the interspace.  Having completed the fusion we now turned attention to the anterior spinal instrumentation. We used the high-speed drill to drill away some anterior spondylosis at the disc spaces so that the plate lay down flat. We selected the appropriate length titanium anterior cervical plate. We laid it along the anterior aspect of the vertebral bodies from C4-C7. We then drilled two 12 mm holes at C4, C5, C6 and C7. We then secured the plate to the vertebral bodies by placing two 12 mm mm self-tapping screws at C4, C5, C6 and C7.  We got good bony purchase with the screws.  We then obtained intraoperative radiograph.  The C4 screws were slightly low on the vertebral body but I did not want to take them out for fear of stripping the vertebral body.  We could not see the lower screws because of the patient's shoulders.  They look good in vivo.  We therefore secured the screws the plate the locking each cam. This completed the instrumentation.  We then obtained hemostasis using bipolar electrocautery. We irrigated the wound out with bacitracin solution. We then removed the retractor. We inspected the esophagus for any damage. There was none apparent. We then reapproximated patient's platysmal muscle with interrupted 3-0 Vicryl suture. We then reapproximated the subcutaneous tissue with interrupted 3-0 Vicryl suture. The skin was reapproximated with Steri-Strips and benzoin. The wound was then covered with bacitracin ointment. A sterile dressing was applied. The drapes were removed. Patient was subsequently extubated by the anesthesia team and transported to the post anesthesia care unit in stable condition. All sponge instrument and needle counts were reportedly correct at the end of this case.

## 2017-01-26 NOTE — Anesthesia Preprocedure Evaluation (Addendum)
Anesthesia Evaluation  Patient identified by MRN, date of birth, ID band Patient awake    Reviewed: Allergy & Precautions, H&P , NPO status , Patient's Chart, lab work & pertinent test results  Airway Mallampati: II  TM Distance: >3 FB Neck ROM: full    Dental  (+) Teeth Intact, Dental Advisory Given   Pulmonary Current Smoker,    breath sounds clear to auscultation       Cardiovascular negative cardio ROS   Rhythm:regular Rate:Normal     Neuro/Psych  Headaches, Seizures -,  PSYCHIATRIC DISORDERS Anxiety Depression  Neuromuscular disease CVA    GI/Hepatic   Endo/Other    Renal/GU      Musculoskeletal  (+) Arthritis ,   Abdominal   Peds  Hematology   Anesthesia Other Findings   Reproductive/Obstetrics                            Anesthesia Physical Anesthesia Plan  ASA: III  Anesthesia Plan: General   Post-op Pain Management:    Induction: Intravenous  PONV Risk Score and Plan: 2 and Ondansetron, Dexamethasone, Midazolam and Treatment may vary due to age or medical condition  Airway Management Planned: Oral ETT  Additional Equipment:   Intra-op Plan:   Post-operative Plan: Extubation in OR  Informed Consent: I have reviewed the patients History and Physical, chart, labs and discussed the procedure including the risks, benefits and alternatives for the proposed anesthesia with the patient or authorized representative who has indicated his/her understanding and acceptance.     Plan Discussed with: CRNA, Anesthesiologist and Surgeon  Anesthesia Plan Comments:         Anesthesia Quick Evaluation

## 2017-01-26 NOTE — Anesthesia Postprocedure Evaluation (Signed)
Anesthesia Post Note  Patient: Heather Figueroa  Procedure(s) Performed: ANTERIOR CERVICAL DECOMPRESSION/DISCECTOMY FUSION, INTERBODY PROSTHESIS, PLATE/SCREWS, POSSIBLE CORPECTOMY CERVICAL FOUR- CERVICAL FIVE, CERVICAL FIVE- CERVICAL SIX, CERVICAL SIX- CERVICAL SEVEN (N/A )     Patient location during evaluation: PACU Anesthesia Type: General Level of consciousness: awake and alert Pain management: pain level controlled Vital Signs Assessment: post-procedure vital signs reviewed and stable Respiratory status: spontaneous breathing, nonlabored ventilation, respiratory function stable and patient connected to nasal cannula oxygen Cardiovascular status: blood pressure returned to baseline and stable Postop Assessment: no apparent nausea or vomiting Anesthetic complications: no    Last Vitals:  Vitals:   01/26/17 1830 01/26/17 1958  BP: 131/67 128/87  Pulse: 85 84  Resp: 16 18  Temp: 36.8 C 36.6 C  SpO2: 99% 98%    Last Pain:  Vitals:   01/26/17 2055  TempSrc:   PainSc: 8                  Heather Figueroa

## 2017-01-26 NOTE — Progress Notes (Signed)
Orthopedic Tech Progress Note Patient Details:  NOLIA TSCHANTZ [birth year] [id number] Charger were applied to wrong patient. Patient ID: KEAIRA WHITEHURST, female   DOB: Nov 30, 1970, 46 y.o.   MRN: [id number]   Braulio Bosch 01/26/2017, 7:02 PM

## 2017-01-26 NOTE — Transfer of Care (Signed)
Immediate Anesthesia Transfer of Care Note  Patient: Heather Figueroa  Procedure(s) Performed: ANTERIOR CERVICAL DECOMPRESSION/DISCECTOMY FUSION, INTERBODY PROSTHESIS, PLATE/SCREWS, POSSIBLE CORPECTOMY CERVICAL FOUR- CERVICAL FIVE, CERVICAL FIVE- CERVICAL SIX, CERVICAL SIX- CERVICAL SEVEN (N/A )  Patient Location: PACU  Anesthesia Type:General  Level of Consciousness: lethargic and responds to stimulation  Airway & Oxygen Therapy: Patient Spontanous Breathing and Patient connected to nasal cannula oxygen  Post-op Assessment: Report given to RN  Post vital signs: Reviewed and stable  Last Vitals:  Vitals:   01/26/17 1127 01/26/17 1649  BP: (!) 160/109   Pulse: 99   Resp: 20   Temp: 36.9 C (!) (P) 36.3 C  SpO2: 99%     Last Pain:  Vitals:   01/26/17 1127  TempSrc: Oral      Patients Stated Pain Goal: 3 (97/74/14 2395)  Complications: No apparent anesthesia complications

## 2017-01-26 NOTE — Anesthesia Procedure Notes (Signed)
Procedure Name: Intubation Date/Time: 01/26/2017 1:05 PM Performed by: Harden Mo, CRNA Pre-anesthesia Checklist: Patient identified, Emergency Drugs available, Suction available, Patient being monitored and Timeout performed Patient Re-evaluated:Patient Re-evaluated prior to induction Oxygen Delivery Method: Circle system utilized Preoxygenation: Pre-oxygenation with 100% oxygen Induction Type: IV induction Ventilation: Oral airway inserted - appropriate to patient size and Mask ventilation without difficulty Laryngoscope Size: Glidescope and 4 Grade View: Grade I Tube type: Oral Tube size: 7.0 mm Number of attempts: 1 Airway Equipment and Method: Video-laryngoscopy,  Rigid stylet and Oral airway Placement Confirmation: ETT inserted through vocal cords under direct vision,  positive ETCO2 and breath sounds checked- equal and bilateral Secured at: 21 cm Tube secured with: Tape Dental Injury: Teeth and Oropharynx as per pre-operative assessment  Comments: Intubation by Chrissie Noa

## 2017-01-26 NOTE — Progress Notes (Signed)
Orthopedic Tech Progress Note Patient Details:  Heather Figueroa 06-26-1970 [id number]  Ortho Devices Type of Ortho Device: [place] cervical collar Ortho Device/Splint Location: bedside Ortho Device/Splint Interventions: Criss Alvine 01/26/2017, 6:59 PM

## 2017-01-26 NOTE — H&P (Signed)
Subjective: The patient is a 46 year old black female who has complained of neck pain and unsteady gait with numbness and tingling in arms.  She was worked up with a cervical MRI which demonstrates spondylosis and stenosis most prominent at C4-5, C5-6 and C6-7.  I discussed the situation with the patient.  We discussed the various treatment options.  She has decided to proceed with surgery.  Past Medical History:  Diagnosis Date  . Anemia   . Anxiety   . Arthritis   . Depression   . Headache   . Neuromuscular disorder ([place])   . Seizures ([place])    pt has had 2 seizures in July 2018  . Stroke [facility])    possible TIA in July 2018    Past Surgical History:  Procedure Laterality Date  . ABDOMINAL HYSTERECTOMY     2002  . APPENDECTOMY     2010  . OVARIAN CYST REMOVAL    . TUBAL LIGATION     1998    Allergies  Allergen Reactions  . Pentazocine Anaphylaxis  . Percocet [Oxycodone-Acetaminophen] Other (See Comments)    Swelling generalized  . Tylenol [Acetaminophen] Swelling    Social History   Tobacco Use  . Smoking status: Current Every Day Smoker    Packs/day: 1.00    Types: Cigarettes  . Smokeless tobacco: Never Used  Substance Use Topics  . Alcohol use: No    History reviewed. No pertinent family history. Prior to Admission medications   Medication Sig Start Date End Date Taking? Authorizing Provider  lamoTRIgine (LAMICTAL) 100 MG tablet Take 100 mg by mouth daily.   Yes [provider]  prazosin (MINIPRESS) 5 MG capsule Take 5 mg by mouth at bedtime.   Yes [provider]  rizatriptan (MAXALT) 10 MG tablet Take 10 mg by mouth as needed for migraine.    Yes [provider]  traZODone (DESYREL) 100 MG tablet Take 100 mg by mouth at bedtime as needed for sleep.  08/15/15  Yes [provider]  Vilazodone HCl (VIIBRYD) 40 MG TABS Take 40 mg by mouth daily. 04/01/16  Yes [provider]     Review of Systems  Positive ROS: As  above  All other systems have been reviewed and were otherwise negative with the exception of those mentioned in the HPI and as above.  Objective: Vital signs in last 24 hours: Temp:  [98.5 F (36.9 C)] 98.5 F (36.9 C) (12/05 1127) Pulse Rate:  [99] 99 (12/05 1127) Resp:  [20] 20 (12/05 1127) BP: (160)/(109) 160/109 (12/05 1127) SpO2:  [99 %] 99 % (12/05 1127) Weight:  [70.6 kg (155 lb 9 oz)] 70.6 kg (155 lb 9 oz) (12/05 1127)  General Appearance: Alert Head: Normocephalic, without obvious abnormality, atraumatic Eyes: PERRL, conjunctiva/corneas clear, EOM's intact,    Ears: Normal  Throat: Normal  Neck: Supple, Back: unremarkable Lungs: Clear to auscultation bilaterally, respirations unlabored Heart: Regular rate and rhythm, no murmur, rub or gallop Abdomen: Soft, non-tender Extremities: Extremities normal, atraumatic, no cyanosis or edema Skin: unremarkable  NEUROLOGIC:   Mental status: alert and oriented, Motor Exam -the patient gives away in all 4 extremities. Sensory Exam - grossly normal Reflexes:  Coordination - grossly normal Gait -unsteady, she ambulates with a walker. Balance -unsteady Cranial Nerves: I: smell Not tested  II: visual acuity  OS: Normal  OD: Normal   II: visual fields Full to confrontation  II: pupils Equal, round, reactive to light  III,VII: ptosis None  III,IV,VI:  extraocular muscles  Full ROM  V: mastication Normal  V: facial light touch sensation  Normal  V,VII: corneal reflex  Present  VII: facial muscle function - upper  Normal  VII: facial muscle function - lower Normal  VIII: hearing Not tested  IX: soft palate elevation  Normal  IX,X: gag reflex Present  XI: trapezius strength  5/5  XI: sternocleidomastoid strength 5/5  XI: neck flexion strength  5/5  XII: tongue strength  Normal    Data Review Lab Results  Component Value Date   WBC 6.2 01/21/2017   HGB 12.9 01/21/2017   HCT 38.7 01/21/2017   MCV 75.9 (L) 01/21/2017    PLT 207 01/21/2017   Lab Results  Component Value Date   NA 137 01/21/2017   K 3.2 (L) 01/21/2017   CL 105 01/21/2017   CO2 24 01/21/2017   BUN 9 01/21/2017   CREATININE 0.65 01/21/2017   GLUCOSE 104 (H) 01/21/2017   No results found for: INR, PROTIME  Assessment/Plan: C4-5, C5-6 and C6-7 disc degeneration, spondylosis, stenosis, cervicalgia, cervical myelopathy: I have discussed the situation with the patient.  I have reviewed her imaging studies with her and pointed out the abnormalities.  We have discussed the various treatment options including surgery.  I have described the surgical treatment option of a C4-5, C5-6 and C6-7 anterior cervical discectomy, fusion and plating with possible corpectomy.  I have shown her surgical models.  I have given her a surgical pamphlet.  We have discussed the risks, benefits, alternatives, expected postoperative course, and likelihood of achieving our goals with surgery.  I have answered all the patient's questions.  She has decided to proceed with surgery.   Ophelia Charter 01/26/2017 12:25 PM

## 2017-01-27 ENCOUNTER — Encounter (HOSPITAL_COMMUNITY): Payer: Self-pay | Admitting: Neurosurgery

## 2017-01-27 NOTE — Evaluation (Signed)
Occupational Therapy Evaluation Patient Details Name: Heather Figueroa MRN: [id number] DOB: 1971-01-11 Today's Date: 01/27/2017    History of Present Illness Pt is a 46 y/o female who presents s/p C4-C7 ACDF on 01/26/17.   Clinical Impression   Pt admitted with the above diagnoses and presents with below problem list. Pt will benefit from continued acute OT to address the below listed deficits and maximize independence with basic ADLs prior to d/c home. PTA pt was mod I with ADLs. Pt is currently min to max A with LB ADLs, bed mobility, and functional transfers. Pt limited by lethargy this session (friend reports she was recently given med for nausea).       Follow Up Recommendations  Home health OT;Supervision/Assistance - 24 hour    Equipment Recommendations  3 in 1 bedside commode    Recommendations for Other Services       Precautions / Restrictions Precautions Precautions: Cervical;Fall Required Braces or Orthoses: Cervical Brace Cervical Brace: Hard collar;At all times Restrictions Weight Bearing Restrictions: No      Mobility Bed Mobility Overal bed mobility: Needs Assistance Bed Mobility: Rolling;Sidelying to Sit;Sit to Sidelying Rolling: Min assist Sidelying to sit: Mod assist     Sit to sidelying: Min assist General bed mobility comments: Assist for pt to achieve full roll onto R side, and cues for railing use. Pt required mod assist for trunk elevation to full sitting position.   Transfers Overall transfer level: Needs assistance Equipment used: Rolling walker (2 wheeled) Transfers: Sit to/from Stand Sit to Stand: Min assist         General transfer comment: VC's for hand placement on seated surface for safety. Pt was able to power-up to full standing position with min assist for balance support and safety. Pt demonstrating bilateral knee buckling initially with stand.     Balance Overall balance assessment: Needs assistance Sitting-balance support:  Feet supported;No upper extremity supported Sitting balance-Leahy Scale: Fair     Standing balance support: Bilateral upper extremity supported;During functional activity Standing balance-Leahy Scale: Poor                             ADL either performed or assessed with clinical judgement   ADL Overall ADL's : Needs assistance/impaired Eating/Feeding: Set up;Sitting   Grooming: Minimal assistance;Sitting   Upper Body Bathing: Minimal assistance;Sitting   Lower Body Bathing: Sit to/from stand;Maximal assistance   Upper Body Dressing : Minimal assistance;Sitting   Lower Body Dressing: Sit to/from stand;Maximal assistance   Toilet Transfer: Minimal assistance   Toileting- Clothing Manipulation and Hygiene: Moderate assistance   Tub/ Shower Transfer: Minimal assistance;Walk-in shower;3 in 1;Rolling walker   Functional mobility during ADLs: Minimal assistance;Rolling walker(sidesteps along bed due to lethergy) General ADL Comments: Pt lethargic throughout session. Spoke with friend who was present and very involved in session regarding brace, ADL strategies.      Vision         Perception     Praxis      Pertinent Vitals/Pain Pain Assessment: Faces Faces Pain Scale: Hurts even more Pain Location: Neck, arms Pain Descriptors / Indicators: Operative site guarding;Discomfort;Tingling;Numbness Pain Intervention(s): Limited activity within patient's tolerance;Monitored during session;Repositioned     Hand Dominance Right   Extremity/Trunk Assessment Upper Extremity Assessment Upper Extremity Assessment: RUE deficits/detail;LUE deficits/detail RUE Deficits / Details: Pt reports numbness and tingling in entire upper arm. Pt with pain in B shoulders supine. Did not attempt ROM due to  pain and lethargy. LUE Deficits / Details: Pt reports tingling only in forearm area down into hand. Pt appears to have difficulty gripping the walker on that side - possibly due to  IV placement as well. Pain with initiation of shoudler ROM with HOB partially elevated.     Lower Extremity Assessment Lower Extremity Assessment: Defer to PT evaluation RLE Deficits / Details: Strength fairly good however pt moving slowly to get into positions for MMT. Noted jerky movements when getting into position as well. Grossly strength 4+/5 LLE Deficits / Details: Strength fairly good however pt moving slowly to get into positions for MMT. Noted jerky movements when getting into position as well. Grossly strength 4+/5   Cervical / Trunk Assessment Cervical / Trunk Assessment: Other exceptions Cervical / Trunk Exceptions: s/p surgery   Communication Communication Communication: No difficulties   Cognition Arousal/Alertness: Lethargic;Suspect due to medications Behavior During Therapy: Flat affect Overall Cognitive Status: Within Functional Limits for tasks assessed                                     General Comments  Pt's friend reports pt recently given med for nausea. Pt lethergic throughout session.     Exercises     Shoulder Instructions      Home Living Family/patient expects to be discharged to:: Private residence Living Arrangements: Non-relatives/Friends Available Help at Discharge: Friend(s);Available 24 hours/day Type of Home: House Home Access: Stairs to enter [employer] of Steps: one step at a time, 3 different areas Entrance Stairs-Rails: None Home Layout: One level     Bathroom Shower/Tub: Occupational psychologist: Standard Bathroom Accessibility: Yes   Home Equipment: Environmental consultant - 4 wheels   Additional Comments: The above information is for friend's home who patient is planning on staying with at d/c. When pt eventually returns home, she reports no stairs to enter.       Prior Functioning/Environment Level of Independence: Independent with assistive device(s)        Comments: Used rollator all the time         OT Problem List: Impaired balance (sitting and/or standing);Decreased knowledge of use of DME or AE;Decreased knowledge of precautions;Impaired UE functional use;Pain      OT Treatment/Interventions: Self-care/ADL training;DME and/or AE instruction;Therapeutic activities;Patient/family education;Balance training    OT Goals(Current goals can be found in the care plan section) Acute Rehab OT Goals Patient Stated Goal: Feel better OT Goal Formulation: With patient/family Time For Goal Achievement: 02/03/17 Potential to Achieve Goals: Good ADL Goals Pt Will Perform Lower Body Bathing: with modified independence;with adaptive equipment;sit to/from stand Pt Will Perform Lower Body Dressing: with modified independence;with adaptive equipment;sit to/from stand Pt Will Transfer to Toilet: with modified independence;ambulating Pt Will Perform Toileting - Clothing Manipulation and hygiene: with modified independence;with adaptive equipment;sit to/from stand Pt Will Perform Tub/Shower Transfer: with modified independence;ambulating;3 in 1;rolling walker;Shower transfer Additional ADL Goal #1: Pt will complete bed mobility at mod I level to prepare for OOB ADLs.  OT Frequency: Min 2X/week   Barriers to D/C:            Co-evaluation              AM-PAC PT "6 Clicks" Daily Activity     Outcome Measure Help from another person eating meals?: None Help from another person taking care of personal grooming?: A Little Help from another person toileting, which  includes using toliet, bedpan, or urinal?: A Lot Help from another person bathing (including washing, rinsing, drying)?: A Lot Help from another person to put on and taking off regular upper body clothing?: A Little Help from another person to put on and taking off regular lower body clothing?: A Lot 6 Click Score: 16   End of Session Equipment Utilized During Treatment: Gait belt;Rolling walker;Cervical collar  Activity Tolerance:  Patient limited by lethargy;Patient limited by pain Patient left: in bed;with call bell/phone within reach;with family/visitor present  OT Visit Diagnosis: Unsteadiness on feet (R26.81);Pain                Time: 0867-6195 OT Time Calculation (min): 14 min Charges:  OT General Charges $OT Visit: 1 Visit OT Evaluation $OT Eval Low Complexity: 1 Low G-Codes:       Hortencia Pilar 01/27/2017, 1:36 PM

## 2017-01-27 NOTE — Evaluation (Signed)
Physical Therapy Evaluation Patient Details Name: Heather Figueroa MRN: [id number] DOB: 1970/06/25 Today's Date: 01/27/2017   History of Present Illness  Pt is a 46 y/o female who presents s/p C4-C7 ACDF on 01/26/17.  Clinical Impression  Pt admitted with above diagnosis. Pt currently with functional limitations due to the deficits listed below (see PT Problem List). At the time of PT eval, pt moving slow with increased assist required for balance support and safety. Min-mod assist provided for bed mobility, transfers, and ambulation. Feel the RW gives the patient increased support vs the rollator, and recommend pt use the RW short term until balance is improved. Friend present throughout session and states "walking is about the same as before surgery". Pt demonstrated antalgic, shuffling gait pattern with decreased DF noted. Pt will benefit from skilled PT to increase their independence and safety with mobility to allow discharge to the venue listed below.       Follow Up Recommendations Home health PT;Supervision/Assistance - 24 hour    Equipment Recommendations  Rolling walker with 5" wheels;3in1 (PT)    Recommendations for Other Services OT consult     Precautions / Restrictions Precautions Precautions: Fall Required Braces or Orthoses: Cervical Brace Cervical Brace: Hard collar;At all times Restrictions Weight Bearing Restrictions: No      Mobility  Bed Mobility Overal bed mobility: Needs Assistance Bed Mobility: Rolling;Sidelying to Sit Rolling: Min assist Sidelying to sit: Mod assist       General bed mobility comments: Assist for pt to achieve full roll onto R side, and cues for railing use. Pt required mod assist for trunk elevation to full sitting position.   Transfers Overall transfer level: Needs assistance Equipment used: Rolling walker (2 wheeled);4-wheeled walker Transfers: Sit to/from Stand Sit to Stand: Min assist         General transfer comment: VC's  for hand placement on seated surface for safety. Pt was able to power-up to full standing position with min assist for balance support and safety. Pt demonstrating bilateral knee buckling initially with stand.   Ambulation/Gait Ambulation/Gait assistance: Min assist Ambulation Distance (Feet): 125 Feet Assistive device: Rolling walker (2 wheeled);4-wheeled walker Gait Pattern/deviations: Step-through pattern;Decreased stride length;Trunk flexed;Narrow base of support;Shuffle Gait velocity: Decreased   General Gait Details: Decreased floor clearance and borderline shuffling at times. Pt initially with 4-wheeled walker but when switched out with RW, pt appeared steadier and with the ability to take a few more steps at a time prior to a standing rest break.   Stairs            Wheelchair Mobility    Modified Rankin (Stroke Patients Only)       Balance Overall balance assessment: Needs assistance Sitting-balance support: Feet supported;No upper extremity supported Sitting balance-Leahy Scale: Fair     Standing balance support: Bilateral upper extremity supported;During functional activity Standing balance-Leahy Scale: Poor                               Pertinent Vitals/Pain Pain Assessment: Faces Faces Pain Scale: Hurts even more Pain Location: Neck, arms Pain Descriptors / Indicators: Operative site guarding;Discomfort;Tingling;Numbness Pain Intervention(s): Limited activity within patient's tolerance;Monitored during session;Repositioned    Home Living Family/patient expects to be discharged to:: Private residence Living Arrangements: Non-relatives/Friends Available Help at Discharge: Friend(s);Available 24 hours/day Type of Home: House Home Access: Stairs to enter Entrance Stairs-Rails: None Entrance Stairs-Number of Steps: one step at a time, 3  different areas Home Layout: One level Home Equipment: Walker - 4 wheels Additional Comments: The above  information is for friend's home who patient is planning on staying with at d/c. When pt eventually returns home, she reports no stairs to enter.     Prior Function Level of Independence: Independent with assistive device(s)         Comments: Used rollator all the time     Hand Dominance   Dominant Hand: Right    Extremity/Trunk Assessment   Upper Extremity Assessment Upper Extremity Assessment: RUE deficits/detail;LUE deficits/detail RUE Deficits / Details: Pt reports numbness and tingling in entire upper arm. Deferred MMT for OT as to not strain the shoulders and neck musculature.  LUE Deficits / Details: Pt reports tingling only in forearm area down into hand. Pt appears to have difficulty gripping the walker on that side - possibly due to IV placement as well. Deferred MMT to OT as to not strain the shoulders and neck musculature.     Lower Extremity Assessment Lower Extremity Assessment: RLE deficits/detail;LLE deficits/detail RLE Deficits / Details: Strength fairly good however pt moving slowly to get into positions for MMT. Noted jerky movements when getting into position as well. Grossly strength 4+/5 LLE Deficits / Details: Strength fairly good however pt moving slowly to get into positions for MMT. Noted jerky movements when getting into position as well. Grossly strength 4+/5    Cervical / Trunk Assessment Cervical / Trunk Assessment: Other exceptions Cervical / Trunk Exceptions: s/p surgery  Communication   Communication: No difficulties  Cognition Arousal/Alertness: Awake/alert Behavior During Therapy: Flat affect Overall Cognitive Status: Within Functional Limits for tasks assessed                                        General Comments      Exercises     Assessment/Plan    PT Assessment Patient needs continued PT services  PT Problem List Decreased strength;Decreased range of motion;Decreased activity tolerance;Decreased  balance;Decreased mobility;Decreased knowledge of use of DME;Decreased safety awareness;Decreased knowledge of precautions;Pain       PT Treatment Interventions Gait training;DME instruction;Stair training;Functional mobility training;Therapeutic activities;Therapeutic exercise;Neuromuscular re-education;Patient/family education    PT Goals (Current goals can be found in the Care Plan section)  Acute Rehab PT Goals Patient Stated Goal: Feel better PT Goal Formulation: With patient/family Time For Goal Achievement: 02/03/17 Potential to Achieve Goals: Good    Frequency Min 5X/week   Barriers to discharge        Co-evaluation               AM-PAC PT "6 Clicks" Daily Activity  Outcome Measure Difficulty turning over in bed (including adjusting bedclothes, sheets and blankets)?: Unable Difficulty moving from lying on back to sitting on the side of the bed? : Unable Difficulty sitting down on and standing up from a chair with arms (e.g., wheelchair, bedside commode, etc,.)?: Unable Help needed moving to and from a bed to chair (including a wheelchair)?: A Little Help needed walking in hospital room?: A Little Help needed climbing 3-5 steps with a railing? : A Lot 6 Click Score: 11    End of Session Equipment Utilized During Treatment: Gait belt;Cervical collar Activity Tolerance: Patient limited by fatigue;Patient limited by pain Patient left: in bed;with call bell/phone within reach;with family/visitor present Nurse Communication: Mobility status PT Visit Diagnosis: Unsteadiness on feet (R26.81);Pain;Other symptoms and  signs involving the nervous system (R29.898) Pain - Right/Left: (Bilaterally) Pain - part of body: Arm;Hand(Neck)    Time: 9507-2257 PT Time Calculation (min) (ACUTE ONLY): 37 min   Charges:   PT Evaluation $PT Eval Moderate Complexity: 1 Mod PT Treatments $Gait Training: 8-22 mins   PT G Codes:        Rolinda Roan, PT, DPT Acute Rehabilitation  Services Pager: [phone]   Thelma Comp 01/27/2017, 11:18 AM

## 2017-01-27 NOTE — Progress Notes (Signed)
Patient ID: Heather Figueroa, female   DOB: 05-06-1970, 46 y.o.   MRN: [id number] Subjective: Patient reports neck soreness, tingling in hands, no arm pain  Objective: Vital signs in last 24 hours: Temp:  [97.3 F (36.3 C)-98.5 F (36.9 C)] 98.5 F (36.9 C) (12/06 0746) Pulse Rate:  [81-99] 91 (12/06 0746) Resp:  [12-20] 16 (12/06 0746) BP: (125-160)/(67-109) 128/77 (12/06 0746) SpO2:  [96 %-100 %] 96 % (12/06 0746) Weight:  [70.6 kg (155 lb 9 oz)] 70.6 kg (155 lb 9 oz) (12/05 1127)  Intake/Output from previous day: 12/05 0701 - 12/06 0700 In: 1700 [I.V.:1700] Out: 875 [Urine:725; Blood:150] Intake/Output this shift: No intake/output data recorded.  awake alert, grips 4-/5, o/w 4/5, in collar, sensation ok  Lab Results: Lab Results  Component Value Date   WBC 6.2 01/21/2017   HGB 12.9 01/21/2017   HCT 38.7 01/21/2017   MCV 75.9 (L) 01/21/2017   PLT 207 01/21/2017   No results found for: INR, PROTIME BMET Lab Results  Component Value Date   NA 137 01/21/2017   K 3.2 (L) 01/21/2017   CL 105 01/21/2017   CO2 24 01/21/2017   GLUCOSE 104 (H) 01/21/2017   BUN 9 01/21/2017   CREATININE 0.65 01/21/2017   CALCIUM 9.3 01/21/2017    Studies/Results: Dg Cervical Spine 2-3 Views  Result Date: 01/26/2017 CLINICAL DATA:  Localization, C3 through C7 ACDF. EXAM: CERVICAL SPINE - 2-3 VIEW COMPARISON:  MRI of the cervical spine January 01, 2017 FINDINGS: Three intraoperative lateral radiographs of the cervical spine. Image 1: Anterior marker anterior enters ventrally at C4-5 with distal tip at C3-4. Image 2: Anterior marker indicates the ventral disc space at C3-4. Image 3: C4 through probable C7 ACDF, caudal aspect obscured by shoulder attenuation. IMPRESSION: Intraoperative radiographs demonstrating C4 through probable C7 ACDF. Electronically Signed   By: Elon Alas M.D.   On: 01/26/2017 16:43    Assessment/Plan: S/p ACDF, seems fairly debilitated, PT/OT, transfer to  floor, may need rehab?   LOS: 1 day    Heather Figueroa S 01/27/2017, 9:42 AM

## 2017-01-28 MED ORDER — MAGNESIUM CITRATE PO SOLN: 1.0000 | Freq: Once | ORAL | Status: DC

## 2017-01-28 MED ORDER — HYDROMORPHONE HCL 2 MG PO TABS: 2.0000 mg | ORAL_TABLET | Freq: Four times a day (QID) | ORAL | 0 refills | Status: DC | PRN

## 2017-01-28 NOTE — Discharge Summary (Signed)
Physician Discharge Summary  Patient ID: Heather Figueroa MRN: [id number] DOB/AGE: Feb 01, 1971 46 y.o.  Admit date: 01/26/2017 Discharge date: 01/28/2017  Admission Diagnoses: cervical stenosis    Discharge Diagnoses: same   Discharged Condition: stable  Hospital Course: The patient was admitted on 01/26/2017 and taken to the operating room where the patient underwent ACDF. The patient tolerated the procedure well and was taken to the recovery room and then to the floor in stable condition. The hospital course was routine. There were no complications. The wound remained clean dry and intact. Pt had appropriate neck soreness. No complaints of arm pain or new N/T/W. The patient remained afebrile with stable vital signs, and tolerated a regular diet. The patient continued to increase activities, and pain was well controlled with oral pain medications.   Consults: None  Significant Diagnostic Studies:  Results for orders placed or performed during the hospital encounter of 01/21/17  Surgical pcr screen  Result Value Ref Range   MRSA, PCR NEGATIVE NEGATIVE   Staphylococcus aureus NEGATIVE NEGATIVE  Basic metabolic panel  Result Value Ref Range   Sodium 137 135 - 145 mmol/L   Potassium 3.2 (L) 3.5 - 5.1 mmol/L   Chloride 105 101 - 111 mmol/L   CO2 24 22 - 32 mmol/L   Glucose, Bld 104 (H) 65 - 99 mg/dL   BUN 9 6 - 20 mg/dL   Creatinine, Ser 0.65 0.44 - 1.00 mg/dL   Calcium 9.3 8.9 - 10.3 mg/dL   GFR calc non Af Amer >60 >60 mL/min   GFR calc Af Amer >60 >60 mL/min   Anion gap 8 5 - 15  CBC  Result Value Ref Range   WBC 6.2 4.0 - 10.5 K/uL   RBC 5.10 3.87 - 5.11 MIL/uL   Hemoglobin 12.9 12.0 - 15.0 g/dL   HCT 38.7 36.0 - 46.0 %   MCV 75.9 (L) 78.0 - 100.0 fL   MCH 25.3 (L) 26.0 - 34.0 pg   MCHC 33.3 30.0 - 36.0 g/dL   RDW 14.4 11.5 - 15.5 %   Platelets 207 150 - 400 K/uL  Type and screen  Result Value Ref Range   ABO/RH(D) B POS    Antibody Screen NEG    Sample Expiration  02/04/2017    Extend sample reason NO TRANSFUSIONS OR PREGNANCY IN THE PAST 3 MONTHS   ABO/Rh  Result Value Ref Range   ABO/RH(D) B POS     Dg Cervical Spine 2-3 Views  Result Date: 01/26/2017 CLINICAL DATA:  Localization, C3 through C7 ACDF. EXAM: CERVICAL SPINE - 2-3 VIEW COMPARISON:  MRI of the cervical spine January 01, 2017 FINDINGS: Three intraoperative lateral radiographs of the cervical spine. Image 1: Anterior marker anterior enters ventrally at C4-5 with distal tip at C3-4. Image 2: Anterior marker indicates the ventral disc space at C3-4. Image 3: C4 through probable C7 ACDF, caudal aspect obscured by shoulder attenuation. IMPRESSION: Intraoperative radiographs demonstrating C4 through probable C7 ACDF. Electronically Signed   By: Elon Alas M.D.   On: 01/26/2017 16:43    Antibiotics:  Anti-infectives (From admission, onward)   Start     Dose/Rate Route Frequency Ordered Stop   01/26/17 2100  ceFAZolin (ANCEF) IVPB 2g/100 mL premix     2 g 200 mL/hr over 30 Minutes Intravenous Every 8 hours 01/26/17 1831 01/27/17 0622   01/26/17 1351  bacitracin 50,000 Units in sodium chloride irrigation 0.9 % 500 mL irrigation  Status:  Discontinued  As needed 01/26/17 1352 01/26/17 1643   01/26/17 1100  ceFAZolin (ANCEF) IVPB 2g/100 mL premix     2 g 200 mL/hr over 30 Minutes Intravenous On call to O.R. 01/26/17 1048 01/26/17 1320   01/26/17 1046  ceFAZolin (ANCEF) 2-4 GM/100ML-% IVPB    Comments:  Laurita Quint   : cabinet override      01/26/17 1046 01/26/17 1310      Discharge Exam: Blood pressure 129/71, pulse 85, temperature 99.1 F (37.3 C), resp. rate 16, height 5\' 1"  (1.549 m), weight 70.6 kg (155 lb 9 oz), SpO2 96 %. Neurologic: Grossly normal Dressing dry  Discharge Medications:   See list  Disposition: home   Final Dx: ACDF  Discharge Instructions    Call MD for:  difficulty breathing, headache or visual disturbances   Complete by:  As directed    Call  MD for:  persistant nausea and vomiting   Complete by:  As directed    Call MD for:  redness, tenderness, or signs of infection (pain, swelling, redness, odor or green/yellow discharge around incision site)   Complete by:  As directed    Call MD for:  severe uncontrolled pain   Complete by:  As directed    Call MD for:  temperature >100.4   Complete by:  As directed    Diet - low sodium heart healthy   Complete by:  As directed    Increase activity slowly   Complete by:  As directed    Remove dressing in 24 hours   Complete by:  As directed       Follow-up Information    [facility] Follow up.   Why:  [place] to arrange your Home health agency for therapy. If you havent received a call within 24-48hrs, Please call them at [phone]. Your reference number is [id number] Contact information: [phone]       Newman Pies, MD. Schedule an appointment as soon as possible for a visit in 2 week(s).   Specialty:  Neurosurgery Contact information: [address] [place] [address] [phone]            Signed: Eustace Moore 01/28/2017, 9:06 AM

## 2017-01-28 NOTE — Progress Notes (Signed)
Pt and friend given D/C instructions with Rx's, verbal understanding was provided. Pt's incision is clean and dry with no sign of infection. Pt's IV was removed prior to D/C. Pt D/C'd home via wheelchair @ 1255 per MD order. Pt is stable @ D/C and has no other needs at this time. Holli Humbles, RN

## 2017-01-28 NOTE — Care Management Note (Addendum)
Case Management Note  Patient Details  Name: Heather Figueroa MRN: [id number] Date of Birth: 1970-11-17  Subjective/Objective:   46 yr old female s/p ACDF C4-5,C5-6,C6-7                 Action/Plan: Case manager spoke with patient and explained that [facility] will arrange for [place] [facility] for therapy. CM faxed orders, OP notes and demographics to [facility] @ [phone]. Called referral to [phone]. Patient's reference #is [id number]. Patient will have family support at discharge   Expected Discharge Date: 01/28/17            Expected Discharge Plan:  [place]  In-House Referral:  NA  Discharge planning Services  CM Consult  Post Acute Care Choice:  Durable Medical Equipment Choice offered to:  NA([facility] arranges [facility] for [employer] members)  DME Arranged:  3-N-1, Walker rolling DME Agency:  [place] Arranged:  PT, OT [place] Agency:  ([facility] arranges [facility] for [place] members)  Status of Service:  In process, will continue to follow  If discussed at Long Length of Stay Meetings, dates discussed:    Additional Comments:  Ninfa Meeker, RN 01/28/2017, 8:45 AM

## 2017-01-28 NOTE — Progress Notes (Signed)
Physical Therapy Treatment Patient Details Name: Heather Figueroa MRN: [id number] DOB: 23-May-1970 Today's Date: 01/28/2017    History of Present Illness Pt is a 46 y/o female who presents s/p C4-C7 ACDF on 01/26/17. PMH includes seizures, CVA, neuromuscular disorder, depression, anxiety.   PT Comments    Pt progressing with mobility. Better technique performing log roll into sitting, requiring supervision and no physical assist. Intermittent min guard for amb with RW; continues to demonstrate unsteady gait technique, which pt and friend reports is her baseline. Friend present throughout session and very supportive; she will be available for 24/7 support at d/c. Will continue to follow acutely to maximize functional mobility and independence prior to d/c with HHPT.    Follow Up Recommendations  Home health PT;Supervision/Assistance - 24 hour     Equipment Recommendations  Rolling walker with 5" wheels;3in1 (PT)    Recommendations for Other Services       Precautions / Restrictions Precautions Precautions: Cervical;Fall Required Braces or Orthoses: Cervical Brace Cervical Brace: Hard collar;At all times Restrictions Weight Bearing Restrictions: No    Mobility  Bed Mobility Overal bed mobility: Needs Assistance Bed Mobility: Rolling;Sidelying to Sit Rolling: Supervision Sidelying to sit: Supervision       General bed mobility comments: Cues for log roll technique. Increased time and effort to push into sitting, but pt able to do so with no physical assist required  Transfers Overall transfer level: Needs assistance Equipment used: Rolling walker (2 wheeled) Transfers: Sit to/from Stand Sit to Stand: Min guard         General transfer comment: Cues for hand placement on bed, but pt still wanting to use BUEs to push up on RW. Min guard to steady RW, but no physical assist for trunk elevation  Ambulation/Gait Ambulation/Gait assistance: Supervision;Min guard Ambulation  Distance (Feet): 120 Feet Assistive device: Rolling walker (2 wheeled) Gait Pattern/deviations: Step-through pattern;Decreased stride length;Trunk flexed;Narrow base of support;Shuffle;Decreased dorsiflexion - right Gait velocity: Decreased Gait velocity interpretation: <1.8 ft/sec, indicative of risk for recurrent falls General Gait Details: RW and supervision, with intermittent min guard for balance. Pt amb on R-toe (reports this is baseline) despite cues to improve RLE clearance. 2x standing rest breaks secondary to fatigue. No LOB   Stairs            Wheelchair Mobility    Modified Rankin (Stroke Patients Only)       Balance Overall balance assessment: Needs assistance Sitting-balance support: Feet supported;No upper extremity supported Sitting balance-Leahy Scale: Fair     Standing balance support: During functional activity;Bilateral upper extremity supported;No upper extremity supported Standing balance-Leahy Scale: Fair Standing balance comment: Able to stand at sink and wash face with no UE support and supervision                            Cognition Arousal/Alertness: Awake/alert Behavior During Therapy: WFL for tasks assessed/performed Overall Cognitive Status: Within Functional Limits for tasks assessed                                 General Comments: Initial lethargy and flat affect, improving with mobility and engagement in discussion (friend's presence seemed to help)      Exercises      General Comments General comments (skin integrity, edema, etc.): Productive cough throughout session. Friend present and very supportive      Pertinent Vitals/Pain Pain  Assessment: 0-10 Pain Score: 8  Pain Location: Neck, R shoulder Pain Descriptors / Indicators: Operative site guarding;Discomfort;Sore Pain Intervention(s): Limited activity within patient's tolerance;Monitored during session    Home Living                       Prior Function            PT Goals (current goals can now be found in the care plan section) Acute Rehab PT Goals Patient Stated Goal: Feel better PT Goal Formulation: With patient/family Time For Goal Achievement: 02/03/17 Potential to Achieve Goals: Good Progress towards PT goals: Progressing toward goals    Frequency    Min 5X/week      PT Plan Current plan remains appropriate    Co-evaluation              AM-PAC PT "6 Clicks" Daily Activity  Outcome Measure  Difficulty turning over in bed (including adjusting bedclothes, sheets and blankets)?: A Little Difficulty moving from lying on back to sitting on the side of the bed? : A Little Difficulty sitting down on and standing up from a chair with arms (e.g., wheelchair, bedside commode, etc,.)?: A Little Help needed moving to and from a bed to chair (including a wheelchair)?: A Little Help needed walking in hospital room?: A Little Help needed climbing 3-5 steps with a railing? : A Little 6 Click Score: 18    End of Session Equipment Utilized During Treatment: Gait belt;Cervical collar Activity Tolerance: Patient tolerated treatment well;Patient limited by fatigue Patient left: Other (comment);in bed;with call bell/phone within reach;with family/visitor present(seated EOB eating breakfast) Nurse Communication: Mobility status PT Visit Diagnosis: Unsteadiness on feet (R26.81);Pain;Other symptoms and signs involving the nervous system (R29.898) Pain - Right/Left: Right Pain - part of body: Shoulder(Neck)     Time: 7408-1448 PT Time Calculation (min) (ACUTE ONLY): 28 min  Charges:  $Gait Training: 8-22 mins $Therapeutic Activity: 8-22 mins                    G Codes:      Mabeline Caras, PT, DPT Acute Rehab Services  Pager: [place] 01/28/2017, 8:47 AM

## 2017-01-28 NOTE — Progress Notes (Signed)
Occupational Therapy Treatment and Discharge Patient Details Name: Heather Figueroa MRN: [id number] DOB: 1970/08/21 Today's Date: 01/28/2017    History of present illness Pt is a 46 y/o female who presents s/p C4-C7 ACDF on 01/26/17. PMH includes seizures, CVA, neuromuscular disorder, depression, anxiety.   OT comments  This 46 yo female admitted and underwent above presents to acute OT to day with all education completed with pt and her caregiver. Pt to D/C today, acute OT will sign off.  Follow Up Recommendations  Home health OT;Supervision/Assistance - 24 hour    Equipment Recommendations  3 in 1 bedside commode       Precautions / Restrictions Precautions Precautions: Cervical;Fall Required Braces or Orthoses: Cervical Brace Cervical Brace: Hard collar Restrictions Weight Bearing Restrictions: No Other Position/Activity Restrictions: Can have collar off for showering, UBD, and eating       Mobility Bed Mobility Overal bed mobility: Needs Assistance Bed Mobility: Sit to Sidelying     Sit to sidelying: Supervision     Balance Overall balance assessment: Needs assistance Sitting-balance support: No upper extremity supported;Feet supported Sitting balance-Leahy Scale: Fair     Standing balance support: During functional activity;Bilateral upper extremity supported;No upper extremity supported Standing balance-Leahy Scale: Fair Standing balance comment: Able to stand at sink and wash face with no UE support and supervision                           ADL either performed or assessed with clinical judgement   ADL                                         General ADL Comments: Educated pt and caregiver on doff/donning C-collar, changing out pads, washing and drying pads. Pt will use the 3n1 as a shower seat. Caregiver will be with pt to help with any basic ADLs until pt can do more for herself. Pt really limited by pain in shoulders  (R>L)--encouraged pt to really try to use her arms as much as possible.     Vision Patient Visual Report: No change from baseline            Cognition Arousal/Alertness: Awake/alert Behavior During Therapy: WFL for tasks assessed/performed Overall Cognitive Status: Within Functional Limits for tasks assessed                                 General Comments: Initial lethargy and flat affect, improving with mobility and engagement in discussion (friend's presence seemed to help)              General Comments Productive cough throughout session. Friend present and very supportive    Pertinent Vitals/ Pain       Pain Assessment: 0-10 Pain Score: 9  Pain Location:  R shoulder>L shoulder Pain Descriptors / Indicators: Tingling;Pins and needles Pain Intervention(s): Limited activity within patient's tolerance;Repositioned;Heat applied         Frequency  Min 2X/week        Progress Toward Goals  OT Goals(current goals can now be found in the care plan section)  Progress towards OT goals: Not progressing toward goals - comment(due to pain, however caregiver is very aware of what the pt needs to do and encouarages her)  Acute Rehab OT Goals Patient Stated Goal: Feel  better  Plan Discharge plan remains appropriate       AM-PAC PT "6 Clicks" Daily Activity     Outcome Measure   Help from another person eating meals?: A Little Help from another person taking care of personal grooming?: A Lot Help from another person toileting, which includes using toliet, bedpan, or urinal?: A Little Help from another person bathing (including washing, rinsing, drying)?: A Lot Help from another person to put on and taking off regular upper body clothing?: A Lot Help from another person to put on and taking off regular lower body clothing?: A Lot 6 Click Score: 14    End of Session Equipment Utilized During Treatment: Cervical collar  OT Visit Diagnosis: Unsteadiness on  feet (R26.81);Pain Pain - Right/Left: Right Pain - part of body: Shoulder   Activity Tolerance Patient limited by pain   Patient Left in bed;with call bell/phone within reach;with family/visitor present   Nurse Communication (pt needs a 3n1 (already taken care of per RN))        Time: 7253-6644 OT Time Calculation (min): 17 min  Charges: OT General Charges $OT Visit: 1 Visit OT Treatments $Self Care/Home Management : 8-22 mins  [place], OTR/L [phone] 01/28/2017

## 2017-02-03 ENCOUNTER — Other Ambulatory Visit: Payer: Self-pay | Admitting: *Deleted

## 2017-02-03 NOTE — Patient Outreach (Signed)
[facility]) Care Management  02/03/2017  SHANTANA CHRISTON 15-May-1970 [id number]    Subjective:  Telephone call to patient's home  / mobile number, no answer, left HIPAA compliant voicemail message, and requested call back.    Objective: Per KPN (Knowledge Performance Now, point of care tool), [employer] iCollaborate, and chart review, patient hospitalized  01/26/17 -01/28/17 for cervical stenosis.     Status post C4-5, C5-6 and C6-7 anterior cervical discectomy/decompression; C4-5, C5-6 and C6-7 interbody arthrodesis with local morcellized autograft bone and Kinnex bone graft extender; insertion of interbody prosthesis at C4-5, C5-6 and C6-7 (Zimmer peek interbody prosthesis); anterior cervical plating from C4-C7 with globus titanium plate on 35/7/89.   Patient also has a history of seizures and stroke.      Assessment: Received [employer] Transition of care referral on 02/01/17.   Transition of care follow up pending patient contact.      Plan: RNCM will call patient for 2nd telephone outreach attempt, transition of care follow up, within 10 business days if no return call.     Magdalen Cabana H. Annia Friendly, BSN, [place] [facility] Telephonic CM Phone: [phone] Fax: [phone]

## 2017-02-04 ENCOUNTER — Encounter: Payer: Self-pay | Admitting: *Deleted

## 2017-02-04 ENCOUNTER — Ambulatory Visit: Payer: Self-pay | Admitting: *Deleted

## 2017-02-04 ENCOUNTER — Other Ambulatory Visit: Payer: Self-pay | Admitting: *Deleted

## 2017-02-04 NOTE — Patient Outreach (Signed)
[facility]) Care Management  02/04/2017  Heather Figueroa [birth year] [id number]   Subjective: Telephone call to patient's home number, spoke with patient, and HIPAA verified.  Discussed [facility] Care Management [employer] Transition of care follow up, patient voiced understanding, and is in agreement to follow up.   Patient states she is doing ok, has been in touch with [facility] several times, being told they are continuing to search for in-network home care provider without success, they will continue to search, and call her back with an update.  Patient states last conversation with [facility] was on 02/02/17, RNCM advised member to call [facility] today to request update, to call [place] to advise of home health service delay,  request assistance from [place] services if needed, patient voiced understanding, and states she will follow up.  Patient states she is able to manage self care and has assistance as needed.  Patient voices understanding of medical diagnosis, surgery,  and treatment plan.  States she is accessing her Christella Scheuermann benefits as needed via member services number on back of card or through [email or web address]  Patient states he does not have any education material, transition of care, care coordination, disease management, disease monitoring, transportation, community resource, or pharmacy needs at this time.  States she is very appreciative of the follow up and is in agreement to receive [place] [facility] information.     Objective:  Per KPN (Knowledge Performance Now, point of care tool), [employer] iCollaborate, and chart review, patient hospitalized  01/26/17 -01/28/17 for cervical stenosis.     Status post C4-5, C5-6 and C6-7 anterior cervical discectomy/decompression;C4-5, C5-6 and C6-7interbody arthrodesis with local morcellized autograft bone and Kinnex bone graft extender; insertion of interbody prosthesis atC4-5, C5-6 and C6-7(Zimmer peek interbody  prosthesis); anterior cervical plating fromC4-C7 with globus titanium plate on 10/31/81.   Patient also has a history of seizures and stroke.      Assessment: Received [employer] Transition of care referral on 02/01/17.   Transition of care follow up completed, no care management needs, and will proceed with case closure.      Plan: RNCM will send patient successful outreach letter, [facility] pamphlet, and magnet. RNCM will send case closure due to follow up completed / no care management needs request to [facility].     Kentrail Shew H. Annia Friendly, BSN, [place] Management [facility] Telephonic CM Phone: [phone] Fax: [phone]

## 2017-03-20 ENCOUNTER — Other Ambulatory Visit: Payer: Self-pay

## 2017-03-20 ENCOUNTER — Emergency Department
Admission: EM | Admit: 2017-03-20 | Discharge: 2017-03-20 | Disposition: A | Discharge status: Home / Self Care | Payer: Managed Care, Other (non HMO) | Attending: Emergency Medicine | Admitting: Emergency Medicine

## 2017-03-20 DIAGNOSIS — S20462A Insect bite (nonvenomous) of left back wall of thorax, initial encounter: Secondary | ICD-10-CM | POA: Diagnosis not present

## 2017-03-20 DIAGNOSIS — F17210 Nicotine dependence, cigarettes, uncomplicated: Secondary | ICD-10-CM | POA: Diagnosis not present

## 2017-03-20 DIAGNOSIS — Y999 Unspecified external cause status: Secondary | ICD-10-CM | POA: Insufficient documentation

## 2017-03-20 DIAGNOSIS — Y939 Activity, unspecified: Secondary | ICD-10-CM | POA: Diagnosis not present

## 2017-03-20 DIAGNOSIS — S00461A Insect bite (nonvenomous) of right ear, initial encounter: Secondary | ICD-10-CM | POA: Diagnosis not present

## 2017-03-20 DIAGNOSIS — Y929 Unspecified place or not applicable: Secondary | ICD-10-CM | POA: Insufficient documentation

## 2017-03-20 DIAGNOSIS — Z79899 Other long term (current) drug therapy: Secondary | ICD-10-CM | POA: Insufficient documentation

## 2017-03-20 DIAGNOSIS — S20461A Insect bite (nonvenomous) of right back wall of thorax, initial encounter: Secondary | ICD-10-CM | POA: Insufficient documentation

## 2017-03-20 DIAGNOSIS — W57XXXA Bitten or stung by nonvenomous insect and other nonvenomous arthropods, initial encounter: Secondary | ICD-10-CM | POA: Insufficient documentation

## 2017-03-20 MED ORDER — PREDNISONE 20 MG PO TABS
60.0000 mg | ORAL_TABLET | Freq: Once | ORAL | Status: AC
  Administered 2017-03-20: 60 mg via ORAL
  Filled 2017-03-20: qty 3

## 2017-03-20 MED ORDER — PREDNISONE 10 MG PO TABS: 50.0000 mg | ORAL_TABLET | Freq: Every day | ORAL | 0 refills | Status: AC

## 2017-03-20 MED ORDER — HYDROXYZINE HCL 25 MG PO TABS
50.0000 mg | ORAL_TABLET | Freq: Once | ORAL | Status: AC
  Administered 2017-03-20: 50 mg via ORAL
  Filled 2017-03-20: qty 2

## 2017-03-20 MED ORDER — HYDROXYZINE PAMOATE 50 MG PO CAPS: 50.0000 mg | ORAL_CAPSULE | Freq: Three times a day (TID) | ORAL | 0 refills | Status: DC | PRN

## 2017-03-20 NOTE — ED Provider Notes (Signed)
[facility] Emergency Department Provider Note  ____________________________________________   First MD Initiated Contact with Patient 03/20/17 [phone]     (approximate)  I have reviewed the triage vital signs and the nursing notes.   HISTORY  Chief Complaint Insect Bite    HPI Heather Figueroa is a 47 y.o. female who self presents to the emergency department with itching to her left ear left back and right back for the past several days.  She is concerned she may have been bit by multiple spiders.  She denies chest pain or shortness of breath.  She has no history of allergic reactions.  She has been using over-the-counter hydrocortisone cream with minimal relief.  Past Medical History:  Diagnosis Date  . Anemia   . Anxiety   . Arthritis   . Depression   . Headache   . Neuromuscular disorder ([place])   . Seizures ([place])    pt has had 2 seizures in July 2018  . Stroke [facility])    possible TIA in July 2018    Patient Active Problem List   Diagnosis Date Noted  . Cervical spondylosis with myelopathy and radiculopathy 01/26/2017    Past Surgical History:  Procedure Laterality Date  . ABDOMINAL HYSTERECTOMY     2002  . ANTERIOR CERVICAL DECOMP/DISCECTOMY FUSION N/A 01/26/2017   Procedure: ANTERIOR CERVICAL DECOMPRESSION/DISCECTOMY FUSION, INTERBODY PROSTHESIS, PLATE/SCREWS, POSSIBLE CORPECTOMY CERVICAL FOUR- CERVICAL FIVE, CERVICAL FIVE- CERVICAL SIX, CERVICAL SIX- CERVICAL SEVEN;  Surgeon: Newman Pies, MD;  Location: [facility];  Service: Neurosurgery;  Laterality: N/A;  ANTERIOR CERVICAL DECOMPRESSION/DISCECTOMY FUSION, INTERBODY PROSTHESIS, PLATE/SCREWS, POSSIBLE CORPE  . APPENDECTOMY     2010  . OVARIAN CYST REMOVAL    . TUBAL LIGATION     1998    Prior to Admission medications   Medication Sig Start Date End Date Taking? Authorizing Provider  HYDROmorphone (DILAUDID) 2 MG tablet Take 1 tablet (2 mg total) by mouth every 6 (six) hours as needed for  severe pain. 01/28/17   Eustace Moore, MD  hydrOXYzine (VISTARIL) 50 MG capsule Take 1 capsule (50 mg total) by mouth 3 (three) times daily as needed for itching. 03/20/17   Darel Hong, MD  lamoTRIgine (LAMICTAL) 100 MG tablet Take 100 mg by mouth daily.    [provider]  prazosin (MINIPRESS) 5 MG capsule Take 5 mg by mouth at bedtime.    [provider]  predniSONE (DELTASONE) 10 MG tablet Take 5 tablets (50 mg total) by mouth daily for 4 days. 03/20/17 03/24/17  Darel Hong, MD  rizatriptan (MAXALT) 10 MG tablet Take 10 mg by mouth as needed for migraine.     [provider]  Vilazodone HCl (VIIBRYD) 40 MG TABS Take 40 mg by mouth daily. 04/01/16   [provider]    Allergies Pentazocine; Percocet [oxycodone-acetaminophen]; and Tylenol [acetaminophen]  No family history on file.  Social History Social History   Tobacco Use  . Smoking status: Current Every Day Smoker    Packs/day: 1.00    Types: Cigarettes  . Smokeless tobacco: Never Used  Substance Use Topics  . Alcohol use: No  . Drug use: No    Review of Systems Constitutional: No fever/chills ENT: No sore throat. Cardiovascular: Denies chest pain. Respiratory: Denies shortness of breath. Gastrointestinal: No abdominal pain.  No nausea, no vomiting.  No diarrhea.  No constipation. Musculoskeletal: Negative for back pain. Neurological: Negative for headaches   ____________________________________________   PHYSICAL EXAM:  VITAL SIGNS:  ED Triage Vitals  Enc Vitals Group     BP 03/20/17 0052 (!) 145/92     Pulse Rate 03/20/17 0052 85     Resp 03/20/17 0052 20     Temp 03/20/17 0052 97.8 F (36.6 C)     Temp Source 03/20/17 0052 Oral     SpO2 03/20/17 0052 98 %     Weight 03/20/17 0050 148 lb (67.1 kg)     Height 03/20/17 0050 5\' 1"  (1.549 m)     Head Circumference --      Peak Flow --      Pain Score 03/20/17 0050 0     Pain Loc --      Pain Edu? --      Excl. in  [place]? --     Constitutional: Alert and oriented x4 pleasant cooperative speaks in full clear sentences Head: Atraumatic.  Left ear swollen and tender slightly erythematous and warm Nose: No congestion/rhinnorhea. Mouth/Throat: No trismus Neck: No stridor.   Cardiovascular: Regular rate and rhythm Respiratory: Normal respiratory effort.  No retractions. Gastrointestinal: Soft nontender Neurologic:  Normal speech and language. No gross focal neurologic deficits are appreciated.  Skin: Multiple bug bites noted across left and right back with overlying excoriations and allergic reaction    ____________________________________________  LABS (all labs ordered are listed, but only abnormal results are displayed)  Labs Reviewed - No data to display   __________________________________________  EKG   ____________________________________________  RADIOLOGY   ____________________________________________   DIFFERENTIAL includes but not limited to  Allergic reaction, anaphylaxis, spider bite, bug bite   PROCEDURES  Procedure(s) performed: no  Procedures  Critical Care performed: no  Observation: no ____________________________________________   INITIAL IMPRESSION / ASSESSMENT AND PLAN / ED COURSE  Pertinent labs & imaging results that were available during my care of the patient were reviewed by me and considered in my medical decision making (see chart for details).  Patient is very well-appearing although does appear quite itchy.  She has multiple bug bites which are consistent with allergic reaction.  Given Atarax prescription as the Benadryl is making her sleepy.  I will discharge her home with a short course of steroids her primary care follow-up.  She has no evidence of anaphylaxis.  Strict return precautions have been given and the patient verbalizes understanding and agreement with the plan.      ____________________________________________   FINAL CLINICAL  IMPRESSION(S) / ED DIAGNOSES  Final diagnoses:  Insect bite, initial encounter      NEW MEDICATIONS STARTED DURING THIS VISIT:  Discharge Medication List as of 03/20/2017  2:18 AM    START taking these medications   Details  hydrOXYzine (VISTARIL) 50 MG capsule Take 1 capsule (50 mg total) by mouth 3 (three) times daily as needed for itching., Starting Sun 03/20/2017, Print    predniSONE (DELTASONE) 10 MG tablet Take 5 tablets (50 mg total) by mouth daily for 4 days., Starting Sun 03/20/2017, Until Thu 03/24/2017, Print         Note:  This document was prepared using Dragon voice recognition software and may include unintentional dictation errors.      Darel Hong, MD 03/20/17 2210

## 2017-03-20 NOTE — Discharge Instructions (Signed)
Please take your steroids for the next 4 days as prescribed and follow-up with your primary care physician as needed.  Return to the emergency department for any concerns.  It was a pleasure to take care of you today, and thank you for coming to our emergency department.  If you have any questions or concerns before leaving please ask the nurse to grab me and I'm more than happy to go through your aftercare instructions again.  If you were prescribed any opioid pain medication today such as Norco, Vicodin, Percocet, morphine, hydrocodone, or oxycodone please make sure you do not drive when you are taking this medication as it can alter your ability to drive safely.  If you have any concerns once you are home that you are not improving or are in fact getting worse before you can make it to your follow-up appointment, please do not hesitate to call 911 and come back for further evaluation.  Darel Hong, MD  Results for orders placed or performed during the hospital encounter of 01/21/17  Surgical pcr screen  Result Value Ref Range   MRSA, PCR NEGATIVE NEGATIVE   Staphylococcus aureus NEGATIVE NEGATIVE  Basic metabolic panel  Result Value Ref Range   Sodium 137 135 - 145 mmol/L   Potassium 3.2 (L) 3.5 - 5.1 mmol/L   Chloride 105 101 - 111 mmol/L   CO2 24 22 - 32 mmol/L   Glucose, Bld 104 (H) 65 - 99 mg/dL   BUN 9 6 - 20 mg/dL   Creatinine, Ser 0.65 0.44 - 1.00 mg/dL   Calcium 9.3 8.9 - 10.3 mg/dL   GFR calc non Af Amer >60 >60 mL/min   GFR calc Af Amer >60 >60 mL/min   Anion gap 8 5 - 15  CBC  Result Value Ref Range   WBC 6.2 4.0 - 10.5 K/uL   RBC 5.10 3.87 - 5.11 MIL/uL   Hemoglobin 12.9 12.0 - 15.0 g/dL   HCT 38.7 36.0 - 46.0 %   MCV 75.9 (L) 78.0 - 100.0 fL   MCH 25.3 (L) 26.0 - 34.0 pg   MCHC 33.3 30.0 - 36.0 g/dL   RDW 14.4 11.5 - 15.5 %   Platelets 207 150 - 400 K/uL  Type and screen  Result Value Ref Range   ABO/RH(D) B POS    Antibody Screen NEG    Sample Expiration  02/04/2017    Extend sample reason NO TRANSFUSIONS OR PREGNANCY IN THE PAST 3 MONTHS   ABO/Rh  Result Value Ref Range   ABO/RH(D) B POS    No results found.

## 2017-03-20 NOTE — ED Triage Notes (Signed)
Reports ? Bitten by insect on Friday.  Reports swelling and pain to left ear, area to left shoulder and on back.

## 2018-08-14 ENCOUNTER — Other Ambulatory Visit: Payer: Self-pay

## 2018-08-14 ENCOUNTER — Emergency Department (HOSPITAL_COMMUNITY): Payer: Self-pay

## 2018-08-14 ENCOUNTER — Encounter (HOSPITAL_COMMUNITY): Payer: Self-pay

## 2018-08-14 ENCOUNTER — Emergency Department (HOSPITAL_COMMUNITY)
Admission: EM | Admit: 2018-08-14 | Discharge: 2018-08-15 | Disposition: A | Discharge status: Home / Self Care | Payer: Self-pay | Attending: Emergency Medicine | Admitting: Emergency Medicine

## 2018-08-14 DIAGNOSIS — R05 Cough: Secondary | ICD-10-CM | POA: Insufficient documentation

## 2018-08-14 DIAGNOSIS — Z5321 Procedure and treatment not carried out due to patient leaving prior to being seen by health care provider: Secondary | ICD-10-CM | POA: Insufficient documentation

## 2018-08-14 NOTE — ED Triage Notes (Signed)
Pt states that she has been coughing up yellow phlegm for the past week with congestion, denies fevers.

## 2018-08-15 NOTE — ED Notes (Signed)
Pt asking how long before she goes back - told we cannot give estimated times, but pt would be seen as soon as possible. Pt wants to LWBS, armband removed

## 2018-09-17 ENCOUNTER — Other Ambulatory Visit: Payer: Self-pay

## 2018-09-17 ENCOUNTER — Emergency Department: Payer: Self-pay

## 2018-09-17 ENCOUNTER — Emergency Department
Admission: EM | Admit: 2018-09-17 | Discharge: 2018-09-17 | Disposition: A | Discharge status: Home / Self Care | Payer: Self-pay | Attending: Emergency Medicine | Admitting: Emergency Medicine

## 2018-09-17 DIAGNOSIS — F17210 Nicotine dependence, cigarettes, uncomplicated: Secondary | ICD-10-CM | POA: Insufficient documentation

## 2018-09-17 DIAGNOSIS — R569 Unspecified convulsions: Secondary | ICD-10-CM | POA: Insufficient documentation

## 2018-09-17 DIAGNOSIS — Z8673 Personal history of transient ischemic attack (TIA), and cerebral infarction without residual deficits: Secondary | ICD-10-CM | POA: Insufficient documentation

## 2018-09-17 LAB — CBC WITH DIFFERENTIAL/PLATELET
Abs Immature Granulocytes: 0.02 10*3/uL (ref 0.00–0.07)
Basophils Absolute: 0 10*3/uL (ref 0.0–0.1)
Basophils Relative: 0 %
Eosinophils Absolute: 0.1 10*3/uL (ref 0.0–0.5)
Eosinophils Relative: 1 %
HCT: 40.9 % (ref 36.0–46.0)
Hemoglobin: 13.8 g/dL (ref 12.0–15.0)
Immature Granulocytes: 0 %
Lymphocytes Relative: 55 %
Lymphs Abs: 5.1 10*3/uL — ABNORMAL HIGH (ref 0.7–4.0)
MCH: 25.5 pg — ABNORMAL LOW (ref 26.0–34.0)
MCHC: 33.7 g/dL (ref 30.0–36.0)
MCV: 75.6 fL — ABNORMAL LOW (ref 80.0–100.0)
Monocytes Absolute: 0.5 10*3/uL (ref 0.1–1.0)
Monocytes Relative: 6 %
Neutro Abs: 3.5 10*3/uL (ref 1.7–7.7)
Neutrophils Relative %: 38 %
Platelets: 245 10*3/uL (ref 150–400)
RBC: 5.41 MIL/uL — ABNORMAL HIGH (ref 3.87–5.11)
RDW: 14.6 % (ref 11.5–15.5)
WBC: 9.3 10*3/uL (ref 4.0–10.5)
nRBC: 0 % (ref 0.0–0.2)

## 2018-09-17 LAB — COMPREHENSIVE METABOLIC PANEL
ALT: 16 U/L (ref 0–44)
AST: 29 U/L (ref 15–41)
Albumin: 5 g/dL (ref 3.5–5.0)
Alkaline Phosphatase: 115 U/L (ref 38–126)
Anion gap: 12 (ref 5–15)
BUN: 15 mg/dL (ref 6–20)
CO2: 21 mmol/L — ABNORMAL LOW (ref 22–32)
Calcium: 9.6 mg/dL (ref 8.9–10.3)
Chloride: 106 mmol/L (ref 98–111)
Creatinine, Ser: 1.04 mg/dL — ABNORMAL HIGH (ref 0.44–1.00)
GFR calc Af Amer: >60 mL/min (ref >60)
GFR calc non Af Amer: >60 mL/min (ref >60)
Glucose, Bld: 101 mg/dL — ABNORMAL HIGH (ref 70–99)
Potassium: 4.7 mmol/L (ref 3.5–5.1)
Sodium: 139 mmol/L (ref 135–145)
Total Bilirubin: 0.8 mg/dL (ref 0.3–1.2)
Total Protein: 8.2 g/dL — ABNORMAL HIGH (ref 6.5–8.1)

## 2018-09-17 LAB — ETHANOL: Alcohol, Ethyl (B): <10 mg/dL (ref <10)

## 2018-09-17 MED ORDER — LORAZEPAM 1 MG PO TABS: 1.0000 mg | ORAL_TABLET | Freq: Two times a day (BID) | ORAL | 0 refills | Status: AC

## 2018-09-17 MED ORDER — LORAZEPAM 2 MG/ML IJ SOLN
1.0000 mg | Freq: Once | INTRAMUSCULAR | Status: AC
  Administered 2018-09-17: 1 mg via INTRAVENOUS

## 2018-09-17 NOTE — ED Provider Notes (Signed)
[facility] Emergency Department Provider Note       Time seen: ----------------------------------------- 7:49 PM on 09/17/2018 ----------------------------------------- Level V caveat: History/ROS limited by altered mental status  I have reviewed the triage vital signs and the nursing notes.  HISTORY   Chief Complaint No chief complaint on file.    HPI Heather Figueroa is a 48 y.o. female with a history of anemia, anxiety, arthritis, neuromuscular disorder, seizures, stroke who presents to the ED for possible seizures.  No further information is available, patient with seizure-like activity on arrival.  Past Medical History:  Diagnosis Date  . Anemia   . Anxiety   . Arthritis   . Depression   . Headache   . Neuromuscular disorder ([place])   . Seizures ([place])    pt has had 2 seizures in July 2018  . Stroke [facility])    possible TIA in July 2018    Patient Active Problem List   Diagnosis Date Noted  . Cervical spondylosis with myelopathy and radiculopathy 01/26/2017    Past Surgical History:  Procedure Laterality Date  . ABDOMINAL HYSTERECTOMY     2002  . ANTERIOR CERVICAL DECOMP/DISCECTOMY FUSION N/A 01/26/2017   Procedure: ANTERIOR CERVICAL DECOMPRESSION/DISCECTOMY FUSION, INTERBODY PROSTHESIS, PLATE/SCREWS, POSSIBLE CORPECTOMY CERVICAL FOUR- CERVICAL FIVE, CERVICAL FIVE- CERVICAL SIX, CERVICAL SIX- CERVICAL SEVEN;  Surgeon: Newman Pies, MD;  Location: [place];  Service: Neurosurgery;  Laterality: N/A;  ANTERIOR CERVICAL DECOMPRESSION/DISCECTOMY FUSION, INTERBODY PROSTHESIS, PLATE/SCREWS, POSSIBLE CORPE  . APPENDECTOMY     2010  . OVARIAN CYST REMOVAL    . TUBAL LIGATION     1998    Allergies Pentazocine, Percocet [oxycodone-acetaminophen], and Tylenol [acetaminophen]  Social History Social History   Tobacco Use  . Smoking status: Current Every Day Smoker    Packs/day: 1.00    Types: Cigarettes  . Smokeless tobacco: Never Used   Substance Use Topics  . Alcohol use: No  . Drug use: No    Review of Systems Unknown, positive for possible seizures.  All systems negative/normal/unremarkable except as stated in the HPI  ____________________________________________   PHYSICAL EXAM:  VITAL SIGNS: ED Triage Vitals  Enc Vitals Group     BP      Pulse      Resp      Temp      Temp src      SpO2      Weight      Height      Head Circumference      Peak Flow      Pain Score      Pain Loc      Pain Edu?      Excl. in [place]?     Constitutional: Alert, with diffuse shaking Eyes: Conjunctivae are normal. Normal extraocular movements.  Patient is following me with her eyes while she is shaking ENT      Head: Normocephalic and atraumatic.      Nose: No congestion/rhinnorhea.      Mouth/Throat: Mucous membranes are moist.      Neck: No stridor. Cardiovascular: Normal rate, regular rhythm. No murmurs, rubs, or gallops. Respiratory: Normal respiratory effort without tachypnea nor retractions. Breath sounds are clear and equal bilaterally. No wheezes/rales/rhonchi. Gastrointestinal: Soft and nontender. Normal bowel sounds Musculoskeletal: Nontender with normal range of motion in extremities. No lower extremity tenderness nor edema. Neurologic:  Normal speech and language. No gross focal neurologic deficits are appreciated.  Some shaking is noted Skin:  Skin is warm,  dry and intact. No rash noted. Psychiatric: Mood and affect are normal. Speech and behavior are normal.  ____________________________________________  ED COURSE:  As part of my medical decision making, I reviewed the following data within the [place] History obtained from family if available, nursing notes, old chart and ekg, as well as notes from prior ED visits. Patient presented for seizure-like activity, we will assess with labs and imaging as indicated at this time.   Procedures  Heather Figueroa was evaluated in  Emergency Department on 09/17/2018 for the symptoms described in the history of present illness. She was evaluated in the context of the global COVID-19 pandemic, which necessitated consideration that the patient might be at risk for infection with the SARS-CoV-2 virus that causes COVID-19. Institutional protocols and algorithms that pertain to the evaluation of patients at risk for COVID-19 are in a state of rapid change based on information released by regulatory bodies including the CDC and federal and state organizations. These policies and algorithms were followed during the patient's care in the ED.  ____________________________________________   LABS (pertinent positives/negatives)  Labs Reviewed  CBC WITH DIFFERENTIAL/PLATELET - Abnormal; Notable for the following components:      Result Value   RBC 5.41 (*)    MCV 75.6 (*)    MCH 25.5 (*)    Lymphs Abs 5.1 (*)    All other components within normal limits  COMPREHENSIVE METABOLIC PANEL - Abnormal; Notable for the following components:   CO2 21 (*)    Glucose, Bld 101 (*)    Creatinine, Ser 1.04 (*)    Total Protein 8.2 (*)    All other components within normal limits  ETHANOL  URINE DRUG SCREEN, QUALITATIVE ([facility] ONLY)    RADIOLOGY Images were viewed by me  CT head Is unremarkable ____________________________________________   DIFFERENTIAL DIAGNOSIS   Pseudoseizure, medication noncompliance, anxiety, seizure, TIA  FINAL ASSESSMENT AND PLAN  Nonepileptic seizure   Plan: The patient had presented for seizure-like activity. Patient's labs were reassuring. Patient's imaging did not reveal any acute process.  Neurologically she is at her baseline now.  She is cleared for outpatient follow-up with her doctor.   Laurence Aly, MD    Note: This note was generated in part or whole with voice recognition software. Voice recognition is usually quite accurate but there are transcription errors that can and very often do  occur. I apologize for any typographical errors that were not detected and corrected.     Earleen Newport, MD 09/17/18 2151

## 2018-09-17 NOTE — ED Notes (Signed)
Patient transported to CT 

## 2018-09-17 NOTE — ED Notes (Addendum)
[id number] daughter

## 2018-09-17 NOTE — ED Notes (Signed)
Pt having trouble speaking/getting words out. Pt has some motor deficits to right side from previous stroke in 2017. MD notified.

## 2018-09-17 NOTE — ED Notes (Signed)
Daughter notified via phone about pt condition.

## 2018-09-17 NOTE — ED Triage Notes (Signed)
Pt arrives via [facility] from home for seizures. EMS states 2 seizures per family. Pt arrives shaking but able to follow instructions and is able to move eyes to see who ever is talking. Hx stroke. Pt holding R arm up in air and shaking L arm. 20 R hand.

## 2018-09-17 NOTE — ED Notes (Signed)
Pt is now talking. Taking a while for her to get words out. Talking quietly. Answering questions appropriately though.

## 2018-10-05 ENCOUNTER — Other Ambulatory Visit (HOSPITAL_COMMUNITY): Payer: Self-pay | Admitting: *Deleted

## 2018-10-05 DIAGNOSIS — Z1231 Encounter for screening mammogram for malignant neoplasm of breast: Secondary | ICD-10-CM

## 2018-12-12 ENCOUNTER — Ambulatory Visit (HOSPITAL_COMMUNITY): Payer: Self-pay

## 2019-01-16 ENCOUNTER — Ambulatory Visit
Admission: RE | Admit: 2019-01-16 | Discharge: 2019-01-16 | Disposition: A | Discharge status: Home / Self Care | Payer: No Typology Code available for payment source | Source: Ambulatory Visit | Attending: Obstetrics and Gynecology | Admitting: Obstetrics and Gynecology

## 2019-01-16 ENCOUNTER — Encounter (HOSPITAL_COMMUNITY): Payer: Self-pay

## 2019-01-16 ENCOUNTER — Other Ambulatory Visit: Payer: Self-pay

## 2019-01-16 ENCOUNTER — Ambulatory Visit (HOSPITAL_COMMUNITY)
Admission: RE | Admit: 2019-01-16 | Discharge: 2019-01-16 | Disposition: A | Discharge status: Home / Self Care | Payer: Self-pay | Source: Ambulatory Visit | Attending: Obstetrics and Gynecology | Admitting: Obstetrics and Gynecology

## 2019-01-16 DIAGNOSIS — Z01419 Encounter for gynecological examination (general) (routine) without abnormal findings: Secondary | ICD-10-CM | POA: Insufficient documentation

## 2019-01-16 DIAGNOSIS — Z1231 Encounter for screening mammogram for malignant neoplasm of breast: Secondary | ICD-10-CM

## 2019-01-16 NOTE — Patient Instructions (Signed)
Explained breast self awareness with Heather Figueroa. Let her know BCCCP will cover Pap smears and HPV typing every 5 years unless has a history of abnormal Pap smears. Referred patient to the [facility] for a screening mammogram. Appointment scheduled for Tuesday, January 16, 2019 at 1510. Patient aware of appointment and will be there. Let patient know will follow up with her within the next couple weeks with results of Pap smear by letter or phone. Informed [facility] that [facility] will [facility]-up with her within the next couple of weeks with results of mammogram by letter or phone. Discussed smoking cessation with patient. Referred to the [facility] Quitline and gave resources to the free smoking cessation classes at [facility]. Heather Figueroa verbalized understanding.  Heather Figueroa, Arvil Chaco, RN 1:20 PM

## 2019-01-16 NOTE — Progress Notes (Signed)
No complaints today.   Pap Smear: Pap smear completed today. Last Pap smear was 08/07/2015 at [facility] at [facility] and normal. Per patient has a history of an abnormal Pap smear 30 years ago that a repeat Pap smear was completed for follow-up. Per patient has had at least three normal Pap smears since abnormal. Patient has a history of a supracervical hysterectomy in 2012 due to AUB and Endometriosis. Last Pap smear result is in Epic.  Physical exam: Breasts Breasts symmetrical. No skin abnormalities bilateral breasts. No nipple retraction bilateral breasts. No nipple discharge bilateral breasts. No lymphadenopathy. No lumps palpated bilateral breasts. No complaints of pain or tenderness on exam. Referred patient to the [facility] for a screening mammogram. Appointment scheduled for Tuesday, January 16, 2019 at 1510.        Pelvic/Bimanual   Ext Genitalia No lesions, no swelling and no discharge observed on external genitalia.         Vagina Vagina pink and normal texture. No lesions or discharge observed in vagina.          Cervix Cervix is present. Cervix pink and of normal texture. No discharge observed.     Uterus Uterus is absent due to history of hysterectomy for benign reasons.        Adnexae Bilateral ovaries present and palpable. No tenderness on palpation.         Rectovaginal No rectal exam completed today since patient had no rectal complaints. No skin abnormalities observed on exam.    Smoking History: Patient is a current smoker. Discussed smoking cessation with patient. Referred to the [facility] Quitline and gave resources to the free smoking cessation classes at [facility].  Patient Navigation: Patient education provided. Access to services provided for patient through BCCCP program.   Breast and Cervical Cancer Risk Assessment: Patient has a family history of a maternal aunt and paternal aunt having breast cancer. Patient has no known genetic mutations  or history of radiation treatment to the chest before age 67. Patient has no history of cervical dysplasia, immunocompromised, or DES exposure in-utero.  Risk Assessment    Risk Scores      01/16/2019   Last edited by: Loletta Parish, RN   5-year risk: 1 %   Lifetime risk: 9 %

## 2019-01-19 LAB — CYTOLOGY - PAP
Comment: NEGATIVE
Diagnosis: NEGATIVE
High risk HPV: NEGATIVE

## 2019-02-05 ENCOUNTER — Telehealth (HOSPITAL_COMMUNITY): Payer: Self-pay | Admitting: *Deleted

## 2019-02-05 NOTE — Telephone Encounter (Signed)
Normal Pap smear result letter mailed to patient by Cyt[facility].

## 2019-02-07 ENCOUNTER — Encounter (HOSPITAL_COMMUNITY): Payer: Self-pay

## 2019-02-07 ENCOUNTER — Emergency Department (HOSPITAL_COMMUNITY)
Admission: EM | Admit: 2019-02-07 | Discharge: 2019-02-07 | Disposition: A | Discharge status: Home / Self Care | Payer: No Typology Code available for payment source | Attending: Emergency Medicine | Admitting: Emergency Medicine

## 2019-02-07 ENCOUNTER — Other Ambulatory Visit: Payer: Self-pay

## 2019-02-07 ENCOUNTER — Observation Stay (HOSPITAL_COMMUNITY): Admission: AD | Admit: 2019-02-07 | Payer: Self-pay | Source: Intra-hospital | Admitting: Psychiatry

## 2019-02-07 DIAGNOSIS — F17210 Nicotine dependence, cigarettes, uncomplicated: Secondary | ICD-10-CM | POA: Insufficient documentation

## 2019-02-07 DIAGNOSIS — F4310 Post-traumatic stress disorder, unspecified: Secondary | ICD-10-CM | POA: Insufficient documentation

## 2019-02-07 DIAGNOSIS — F314 Bipolar disorder, current episode depressed, severe, without psychotic features: Secondary | ICD-10-CM | POA: Insufficient documentation

## 2019-02-07 DIAGNOSIS — F32A Depression, unspecified: Secondary | ICD-10-CM

## 2019-02-07 DIAGNOSIS — Z79899 Other long term (current) drug therapy: Secondary | ICD-10-CM | POA: Insufficient documentation

## 2019-02-07 DIAGNOSIS — Z20828 Contact with and (suspected) exposure to other viral communicable diseases: Secondary | ICD-10-CM | POA: Insufficient documentation

## 2019-02-07 DIAGNOSIS — F329 Major depressive disorder, single episode, unspecified: Secondary | ICD-10-CM

## 2019-02-07 LAB — COMPREHENSIVE METABOLIC PANEL
ALT: 14 U/L (ref 0–44)
AST: 17 U/L (ref 15–41)
Albumin: 4.1 g/dL (ref 3.5–5.0)
Alkaline Phosphatase: 100 U/L (ref 38–126)
Anion gap: 10 (ref 5–15)
BUN: 10 mg/dL (ref 6–20)
CO2: 22 mmol/L (ref 22–32)
Calcium: 9.3 mg/dL (ref 8.9–10.3)
Chloride: 109 mmol/L (ref 98–111)
Creatinine, Ser: 0.53 mg/dL (ref 0.44–1.00)
GFR calc Af Amer: >60 mL/min (ref >60)
GFR calc non Af Amer: >60 mL/min (ref >60)
Glucose, Bld: 106 mg/dL — ABNORMAL HIGH (ref 70–99)
Potassium: 3.6 mmol/L (ref 3.5–5.1)
Sodium: 141 mmol/L (ref 135–145)
Total Bilirubin: 0.4 mg/dL (ref 0.3–1.2)
Total Protein: 6.9 g/dL (ref 6.5–8.1)

## 2019-02-07 LAB — RESPIRATORY PANEL BY RT PCR (FLU A&B, COVID)
Influenza A by PCR: NEGATIVE
Influenza B by PCR: NEGATIVE
SARS Coronavirus 2 by RT PCR: NEGATIVE

## 2019-02-07 LAB — I-STAT BETA HCG BLOOD, ED (MC, WL, AP ONLY): I-stat hCG, quantitative: <5 m[IU]/mL (ref <5)

## 2019-02-07 LAB — CBC
HCT: 41.4 % (ref 36.0–46.0)
Hemoglobin: 13.4 g/dL (ref 12.0–15.0)
MCH: 25.1 pg — ABNORMAL LOW (ref 26.0–34.0)
MCHC: 32.4 g/dL (ref 30.0–36.0)
MCV: 77.7 fL — ABNORMAL LOW (ref 80.0–100.0)
Platelets: 215 10*3/uL (ref 150–400)
RBC: 5.33 MIL/uL — ABNORMAL HIGH (ref 3.87–5.11)
RDW: 14.8 % (ref 11.5–15.5)
WBC: 6.2 10*3/uL (ref 4.0–10.5)
nRBC: 0 % (ref 0.0–0.2)

## 2019-02-07 LAB — SALICYLATE LEVEL: Salicylate Lvl: 7.2 mg/dL (ref 2.8–30.0)

## 2019-02-07 LAB — RAPID URINE DRUG SCREEN, HOSP PERFORMED
Amphetamines: NOT DETECTED
Barbiturates: NOT DETECTED
Benzodiazepines: NOT DETECTED
Cocaine: NOT DETECTED
Opiates: NOT DETECTED
Tetrahydrocannabinol: NOT DETECTED

## 2019-02-07 LAB — ETHANOL: Alcohol, Ethyl (B): <10 mg/dL (ref <10)

## 2019-02-07 LAB — ACETAMINOPHEN LEVEL: Acetaminophen (Tylenol), Serum: <10 ug/mL — ABNORMAL LOW (ref 10–30)

## 2019-02-07 MED ORDER — PRAZOSIN HCL 2 MG PO CAPS: 5.0000 mg | ORAL_CAPSULE | Freq: Every day | ORAL | Status: DC

## 2019-02-07 MED ORDER — LAMOTRIGINE 100 MG PO TABS: 100.0000 mg | ORAL_TABLET | Freq: Every day | ORAL | Status: DC

## 2019-02-07 MED ORDER — LORAZEPAM 1 MG PO TABS: 1.0000 mg | ORAL_TABLET | Freq: Two times a day (BID) | ORAL | Status: DC

## 2019-02-07 MED ORDER — IBUPROFEN 400 MG PO TABS: 600.0000 mg | ORAL_TABLET | Freq: Three times a day (TID) | ORAL | Status: DC | PRN

## 2019-02-07 MED ORDER — ONDANSETRON HCL 4 MG PO TABS: 4.0000 mg | ORAL_TABLET | Freq: Three times a day (TID) | ORAL | Status: DC | PRN

## 2019-02-07 MED ORDER — ALUM & MAG HYDROXIDE-SIMETH 200-200-20 MG/5ML PO SUSP: 30.0000 mL | Freq: Four times a day (QID) | ORAL | Status: DC | PRN

## 2019-02-07 MED ORDER — VILAZODONE HCL 40 MG PO TABS: 40.0000 mg | ORAL_TABLET | Freq: Every day | ORAL | Status: DC

## 2019-02-07 NOTE — ED Notes (Signed)
Discharge instructions reviewed with pt. Pt verbalized understanding.   

## 2019-02-07 NOTE — ED Notes (Signed)
This Probation officer explained to the pt she has a room at [facility] obs unit and attempted to get the pt to sign the voluntary admission form. Pt refused to sign at this time. [place] contacted and spoke to [place]. MD notified.

## 2019-02-07 NOTE — ED Notes (Signed)
TTS call in process.

## 2019-02-07 NOTE — ED Notes (Signed)
Patient has been wanded by [facility] ED Security.

## 2019-02-07 NOTE — ED Provider Notes (Signed)
[facility] EMERGENCY DEPARTMENT Provider Note   CSN: [id number] Arrival date & time: 02/07/19  1624     History Chief Complaint  Patient presents with  . Medical Clearance    Heather Figueroa is a 48 y.o. female.  She is brought in by EMS after family contact.  She said she was having a meltdown at home.  Her psychiatrist have been adjusting her medications but it has not been helping.  She reports ongoing depression insomnia racing thoughts and passive thoughts of suicide.  She was hypertensive when EMS got there but has improved blood pressure here.  Still tearful.  Not on an IVC.  The history is provided by the patient.  Mental Health Problem Presenting symptoms: depression and suicidal thoughts   Degree of incapacity (severity):  Severe Onset quality:  Gradual Timing:  Constant Progression:  Worsening Chronicity:  Recurrent Context: recent medication change and stressful life event   Context: not alcohol use and not drug abuse   Treatment compliance:  Unable to specify Relieved by:  None tried Worsened by:  Family interactions and lack of sleep Ineffective treatments:  Antidepressants Associated symptoms: feelings of worthlessness, insomnia and irritability   Associated symptoms: no abdominal pain, no chest pain and no headaches   Risk factors: hx of mental illness        Past Medical History:  Diagnosis Date  . Anemia   . Anxiety   . Arthritis   . Depression   . Headache   . Neuromuscular disorder ([place])   . Seizures ([place])    pt has had 2 seizures in July 2018  . Stroke [facility]    possible TIA in July 2018    Patient Active Problem List   Diagnosis Date Noted  . Well woman exam with routine gynecological exam 01/16/2019  . Cervical spondylosis with myelopathy and radiculopathy 01/26/2017    Past Surgical History:  Procedure Laterality Date  . ABDOMINAL HYSTERECTOMY     2002  . ANTERIOR CERVICAL DECOMP/DISCECTOMY FUSION N/A 01/26/2017     Procedure: ANTERIOR CERVICAL DECOMPRESSION/DISCECTOMY FUSION, INTERBODY PROSTHESIS, PLATE/SCREWS, POSSIBLE CORPECTOMY CERVICAL FOUR- CERVICAL FIVE, CERVICAL FIVE- CERVICAL SIX, CERVICAL SIX- CERVICAL SEVEN;  Surgeon: Newman Pies, MD;  Location: [facility];  Service: Neurosurgery;  Laterality: N/A;  ANTERIOR CERVICAL DECOMPRESSION/DISCECTOMY FUSION, INTERBODY PROSTHESIS, PLATE/SCREWS, POSSIBLE CORPE  . APPENDECTOMY     2010  . OVARIAN CYST REMOVAL    . TUBAL LIGATION     1998     OB History    Gravida  6   Para      Term      Preterm      AB  2   Living  4     SAB  1   TAB      Ectopic  1   Multiple      Live Births  4           Family History  Problem Relation Age of Onset  . Colon cancer Mother   . Pulmonary embolism Sister   . Bladder Cancer Maternal Grandmother     Social History   Tobacco Use  . Smoking status: Current Every Day Smoker    Packs/day: 1.00    Types: Cigarettes  . Smokeless tobacco: Never Used  Substance Use Topics  . Alcohol use: No  . Drug use: No    Home Medications Prior to Admission medications   Medication Sig Start Date End Date Taking? Authorizing Provider  HYDROmorphone (DILAUDID) 2 MG tablet Take 1 tablet (2 mg total) by mouth every 6 (six) hours as needed for severe pain. 01/28/17   Eustace Moore, MD  hydrOXYzine (VISTARIL) 50 MG capsule Take 1 capsule (50 mg total) by mouth 3 (three) times daily as needed for itching. 03/20/17   Darel Hong, MD  lamoTRIgine (LAMICTAL) 100 MG tablet Take 100 mg by mouth daily.    [provider]  LORazepam (ATIVAN) 1 MG tablet Take 1 tablet (1 mg total) by mouth 2 (two) times daily. 09/17/18 09/17/19  Earleen Newport, MD  prazosin (MINIPRESS) 5 MG capsule Take 5 mg by mouth at bedtime.    [provider]  rizatriptan (MAXALT) 10 MG tablet Take 10 mg by mouth as needed for migraine.     [provider]  Vilazodone HCl (VIIBRYD) 40 MG TABS Take 40 mg by  mouth daily. 04/01/16   [provider]    Allergies    Pentazocine, Percocet [oxycodone-acetaminophen], and Tylenol [acetaminophen]  Review of Systems   Review of Systems  Constitutional: Positive for irritability. Negative for fever.  HENT: Negative for sore throat.   Eyes: Negative for visual disturbance.  Respiratory: Negative for shortness of breath.   Cardiovascular: Negative for chest pain.  Gastrointestinal: Negative for abdominal pain.  Genitourinary: Negative for dysuria.  Musculoskeletal: Negative for neck pain.  Skin: Negative for rash.  Neurological: Negative for headaches.  Psychiatric/Behavioral: Positive for suicidal ideas. The patient has insomnia.     Physical Exam Updated Vital Signs BP (!) 154/95 (BP Location: Left Arm)   Pulse 79   Temp 98.3 F (36.8 C) (Oral)   Resp 18   Wt 66 kg   SpO2 98% Comment: Simultaneous filing. User may not have seen previous data.  BMI 24.21 kg/m   Physical Exam Vitals and nursing note reviewed.  Constitutional:      General: She is not in acute distress.    Appearance: She is well-developed.  HENT:     Head: Normocephalic and atraumatic.  Eyes:     Conjunctiva/sclera: Conjunctivae normal.  Cardiovascular:     Rate and Rhythm: Normal rate and regular rhythm.     Heart sounds: No murmur.  Pulmonary:     Effort: Pulmonary effort is normal. No respiratory distress.     Breath sounds: Normal breath sounds.  Abdominal:     Palpations: Abdomen is soft.     Tenderness: There is no abdominal tenderness.  Musculoskeletal:        General: Normal range of motion.     Cervical back: Neck supple.     Right lower leg: No edema.     Left lower leg: No edema.  Skin:    General: Skin is warm and dry.     Capillary Refill: Capillary refill takes less than 2 seconds.  Neurological:     General: No focal deficit present.     Mental Status: She is alert.  Psychiatric:        Mood and Affect: Mood is depressed. Affect is  tearful.        Behavior: Behavior is cooperative.        Thought Content: Thought content includes suicidal (passive) ideation.     ED Results / Procedures / Treatments   Labs (all labs ordered are listed, but only abnormal results are displayed) Labs Reviewed  COMPREHENSIVE METABOLIC PANEL - Abnormal; Notable for the following components:      Result Value   Glucose,  Bld 106 (*)    All other components within normal limits  ACETAMINOPHEN LEVEL - Abnormal; Notable for the following components:   Acetaminophen (Tylenol), Serum <10 (*)    All other components within normal limits  CBC - Abnormal; Notable for the following components:   RBC 5.33 (*)    MCV 77.7 (*)    MCH 25.1 (*)    All other components within normal limits  RESPIRATORY PANEL BY RT PCR (FLU A&B, COVID)  ETHANOL  SALICYLATE LEVEL  RAPID URINE DRUG SCREEN, HOSP PERFORMED  I-STAT BETA HCG BLOOD, ED ([facility], [facility], [facility] ONLY)    EKG None  Radiology No results found.  Procedures Procedures (including critical care time)  Medications Ordered in ED Medications - No data to display  ED Course  I have reviewed the triage vital signs and the nursing notes.  Pertinent labs & imaging results that were available during my care of the patient were reviewed by me and considered in my medical decision making (see chart for details).  Clinical Course as of Feb 07 1000  Wed Feb 07, 1075  1528 48 year old female history of depression here with increased insomnia racing thoughts depressive symptoms and suicidal thoughts in the setting of multiple medication changes with no improvement.  Getting screening labs and once those are back we will put her in for TTS consult.   [MB]  1802 Patient's lab work is not showing any serious findings to keep her from being medically cleared.   [MB]  2120 Received a call from TTS, they recommended the patient be observed overnight and have psychiatry evaluate her tomorrow.   [MB]  2135  Patient states she is unwilling to stay.  TTS felt it was up to me if I felt she needed an IVC.  The patient states she is going home to stay with family and she has an appoint with her psychiatrist tomorrow morning.  She contracts for safety with me and she said she would return to the hospital if she had any thoughts of hurting herself.   [MB]    Clinical Course User Index [MB] Hayden Rasmussen, MD   MDM Rules/Calculators/A&P                       Final Clinical Impression(s) / ED Diagnoses Final diagnoses:  Depression, unspecified depression type    Rx / DC Orders ED Discharge Orders    None       Hayden Rasmussen, MD 02/08/19 1002

## 2019-02-07 NOTE — Discharge Instructions (Addendum)
You were seen in the emergency department for evaluation of increased depression and thoughts of hurting yourself.  You had a medical clearance and were evaluated by TTS.  Their recommendation was that you stay overnight to be seen by the psychiatrist in the morning but you felt you would be better served by going home and seeing your own psychiatrist tomorrow.  Please return to the emergency department if you have any worsening symptoms or thoughts of hurting yourself.

## 2019-02-07 NOTE — ED Triage Notes (Signed)
Pt BIB [facility] for eval of worsening depression vs SI. Pt w/ hx of bipolar disease. EMS reports pt has been by psych MDs this week, w/ f/u next week but family reports pt is doing worse. Family reports "they have not seen pt like this in a long time". EMS report pt extremely emotional and upset on their arrival. Initially hypertensive to 200s on their arrival. Has calmed down to 150s on arrival here. Able to converse. Endorses passive SI, no plan.

## 2019-02-07 NOTE — ED Notes (Signed)
EDP at bedside  

## 2019-02-07 NOTE — BH Assessment (Signed)
[facility] Assessment Progress Note   Clinician informed Dr. Melina Copa that overnight observation and AM psych evaluation was being recommended.    Clinician talked with [facility] Wynonia Hazard who said that patient could be brought to the observation unit at [facility].  Room assignment is 402-1.  Admitting provider is Talbot Grumbling, NP.  Attending will be Dr. Dwyane Dee.    Clinician informed nurse Laurena Bering of disposition and gave her the adult unit number for nurse report [phone]).  Patient will need to sign the voluntary admission form for [facility].\  Nurse Benjamine Mola called back and said that pt was hesitant about signing since she thought she might be able to go home   Patient had stated that she felt safe going home.  The daughter felt she may be safer being observed.  Clinician told Benjamine Mola that Dr. Melina Copa may talk to patient about not being IVC'ed but rather signing voluntarily.
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
## 2020-03-12 ENCOUNTER — Other Ambulatory Visit: Payer: Self-pay | Admitting: *Deleted

## 2020-03-12 DIAGNOSIS — Z1231 Encounter for screening mammogram for malignant neoplasm of breast: Secondary | ICD-10-CM

## 2020-03-31 ENCOUNTER — Other Ambulatory Visit: Payer: Self-pay

## 2020-03-31 ENCOUNTER — Ambulatory Visit
Admission: RE | Admit: 2020-03-31 | Discharge: 2020-03-31 | Disposition: A | Discharge status: Home / Self Care | Payer: Medicaid Other | Source: Ambulatory Visit

## 2020-03-31 DIAGNOSIS — Z1231 Encounter for screening mammogram for malignant neoplasm of breast: Secondary | ICD-10-CM

## 2020-04-01 ENCOUNTER — Other Ambulatory Visit: Payer: Self-pay | Admitting: *Deleted

## 2020-04-01 DIAGNOSIS — N63 Unspecified lump in unspecified breast: Secondary | ICD-10-CM

## 2020-04-10 ENCOUNTER — Other Ambulatory Visit: Payer: Self-pay

## 2020-04-10 ENCOUNTER — Other Ambulatory Visit: Payer: Medicaid Other

## 2020-04-10 DIAGNOSIS — N6310 Unspecified lump in the right breast, unspecified quadrant: Secondary | ICD-10-CM

## 2020-04-15 ENCOUNTER — Ambulatory Visit
Admission: RE | Admit: 2020-04-15 | Discharge: 2020-04-15 | Disposition: A | Discharge status: Home / Self Care | Payer: Medicaid Other | Source: Ambulatory Visit | Attending: Obstetrics and Gynecology | Admitting: Obstetrics and Gynecology

## 2020-04-15 ENCOUNTER — Other Ambulatory Visit: Payer: Self-pay

## 2020-04-15 ENCOUNTER — Ambulatory Visit: Payer: No Typology Code available for payment source | Admitting: *Deleted

## 2020-04-15 ENCOUNTER — Ambulatory Visit
Admission: RE | Admit: 2020-04-15 | Discharge: 2020-04-15 | Disposition: A | Discharge status: Home / Self Care | Payer: No Typology Code available for payment source | Source: Ambulatory Visit | Attending: Obstetrics and Gynecology | Admitting: Obstetrics and Gynecology

## 2020-04-15 VITALS — BP 136/82 | Wt 154.2 lb

## 2020-04-15 DIAGNOSIS — N6311 Unspecified lump in the right breast, upper outer quadrant: Secondary | ICD-10-CM

## 2020-04-15 DIAGNOSIS — N6310 Unspecified lump in the right breast, unspecified quadrant: Secondary | ICD-10-CM

## 2020-04-15 DIAGNOSIS — Z1239 Encounter for other screening for malignant neoplasm of breast: Secondary | ICD-10-CM

## 2020-04-15 DIAGNOSIS — R2231 Localized swelling, mass and lump, right upper limb: Secondary | ICD-10-CM

## 2020-04-15 NOTE — Patient Instructions (Signed)
Explained breast self awareness with [name]. Patient did not need a Pap smear today due to last Pap smear and HPV typing was 01/16/2019. Let her know BCCCP will cover Pap smears and HPV typing every 5 years unless has a history of abnormal Pap smears. Referred patient to the [facility] for a diagnostic mammogram. Appointment scheduled Tuesday, April 15, 2020 at 1400. Patient aware of appointment and will be there. Discussed smoking cessation with patient. Referred to the [facility] Quitline and gave resources to the free smoking cessation classes at [place].[name] verbalized understanding.  Ernie Sagrero, Arvil Chaco, RN  1:04 PM

## 2020-04-15 NOTE — Progress Notes (Signed)
Ms. Heather Figueroa is a 50 y.o. female who presents to [facility] clinic today with complaint of three right breast lumps and right outer breast tenderness x 3 weeks. Patient stated the pain comes and goes. Patient rates the pain at a 1 out of 10.    Pap Smear: Pap smear not completed today. Last Pap smear was 01/16/2019 at [facility] and normal with negative HPV. Per patient has a history of an abnormal Pap smear 32 years ago that a repeat Pap smear was completed for follow-up. Per patient has had at least three normal Pap smears since abnormal. Patient has a history of a supracervical hysterectomy in 2012 due to AUB and Endometriosis. Last two Pap smear results are in Epic.   Physical exam: Breasts Breasts symmetrical. No skin abnormalities bilateral breasts. No nipple retraction bilateral breasts. No nipple discharge bilateral breasts. No lymphadenopathy. No lumps palpated left breast. Palpated two bb sized right breast lumps at 11 o'clock 6cm from the nipple and 7 cm from the nipple. Palpated a right axillary lump at 11 o'clock 15 cm from the nipple. Complaints or right outer and axillary breast tenderness on exam.      Pelvic/Bimanual Pap is not indicated today per BCCCP guidelines.   Smoking History: Patient is a current smoker. Discussed smoking cessation with patient. Referred to the [facility] Quitline and gave resources to the free smoking cessation classes at [facility].  Patient Navigation: Patient education provided. Access to services provided for patient through BCCCP program.    Breast and Cervical Cancer Risk Assessment: Patient has a family history of a maternal aunt and paternal aunt having breast cancer. Patient has no known genetic mutations or history of radiation treatment to the chest before age 18. Patient has no history of cervical dysplasia, immunocompromised, or DES exposure in-utero.  Risk Assessment    Risk Scores      04/15/2020 01/16/2019   Last edited by: Demetrius Revel, LPN Kaytlan Behrman, Heath Gold, RN   5-year risk: 1 % 1 %   Lifetime risk: 8.9 % 9 %          A: BCCCP exam without pap smear Complaint of three right breast lumps and pain.  P: Referred patient to the [facility] for a diagnostic mammogram. Appointment scheduled Tuesday, April 15, 2020 at 1400.  Loletta Parish, RN 04/15/2020 1:04 PM

## 2021-03-20 DIAGNOSIS — E1159 Type 2 diabetes mellitus with other circulatory complications: Secondary | ICD-10-CM

## 2021-03-20 DIAGNOSIS — G40909 Epilepsy, unspecified, not intractable, without status epilepticus: Secondary | ICD-10-CM

## 2021-03-20 DIAGNOSIS — F3175 Bipolar disorder, in partial remission, most recent episode depressed: Secondary | ICD-10-CM | POA: Insufficient documentation

## 2021-06-18 ENCOUNTER — Other Ambulatory Visit: Payer: Self-pay

## 2021-06-18 ENCOUNTER — Emergency Department (HOSPITAL_COMMUNITY): Payer: Medicaid Other

## 2021-06-18 ENCOUNTER — Encounter (HOSPITAL_COMMUNITY): Payer: Self-pay | Admitting: Emergency Medicine

## 2021-06-18 ENCOUNTER — Observation Stay (HOSPITAL_COMMUNITY)
Admission: EM | Admit: 2021-06-18 | Discharge: 2021-06-20 | Disposition: A | Discharge status: Home / Self Care | Payer: Medicaid Other | Attending: Student | Admitting: Student

## 2021-06-18 DIAGNOSIS — R0789 Other chest pain: Principal | ICD-10-CM | POA: Diagnosis present

## 2021-06-18 DIAGNOSIS — R079 Chest pain, unspecified: Secondary | ICD-10-CM | POA: Diagnosis present

## 2021-06-18 DIAGNOSIS — F17210 Nicotine dependence, cigarettes, uncomplicated: Secondary | ICD-10-CM | POA: Insufficient documentation

## 2021-06-18 DIAGNOSIS — E876 Hypokalemia: Secondary | ICD-10-CM | POA: Diagnosis present

## 2021-06-18 DIAGNOSIS — Q211 Atrial septal defect, unspecified: Secondary | ICD-10-CM | POA: Diagnosis not present

## 2021-06-18 DIAGNOSIS — G40909 Epilepsy, unspecified, not intractable, without status epilepticus: Secondary | ICD-10-CM | POA: Diagnosis not present

## 2021-06-18 DIAGNOSIS — Z20822 Contact with and (suspected) exposure to covid-19: Secondary | ICD-10-CM | POA: Insufficient documentation

## 2021-06-18 DIAGNOSIS — Z87891 Personal history of nicotine dependence: Secondary | ICD-10-CM | POA: Diagnosis present

## 2021-06-18 DIAGNOSIS — Z720 Tobacco use: Secondary | ICD-10-CM | POA: Diagnosis present

## 2021-06-18 LAB — BASIC METABOLIC PANEL
Anion gap: 10 (ref 5–15)
BUN: 9 mg/dL (ref 6–20)
CO2: 22 mmol/L (ref 22–32)
Calcium: 9.5 mg/dL (ref 8.9–10.3)
Chloride: 109 mmol/L (ref 98–111)
Creatinine, Ser: 0.72 mg/dL (ref 0.44–1.00)
GFR, Estimated: >60 mL/min (ref >60)
Glucose, Bld: 101 mg/dL — ABNORMAL HIGH (ref 70–99)
Potassium: 3.5 mmol/L (ref 3.5–5.1)
Sodium: 141 mmol/L (ref 135–145)

## 2021-06-18 LAB — TROPONIN I (HIGH SENSITIVITY)
Troponin I (High Sensitivity): 4 ng/L (ref <18)
Troponin I (High Sensitivity): 4 ng/L (ref <18)
Troponin I (High Sensitivity): 5 ng/L (ref <18)

## 2021-06-18 LAB — CBC
HCT: 41 % (ref 36.0–46.0)
Hemoglobin: 13.5 g/dL (ref 12.0–15.0)
MCH: 26 pg (ref 26.0–34.0)
MCHC: 32.9 g/dL (ref 30.0–36.0)
MCV: 78.8 fL — ABNORMAL LOW (ref 80.0–100.0)
Platelets: 233 10*3/uL (ref 150–400)
RBC: 5.2 MIL/uL — ABNORMAL HIGH (ref 3.87–5.11)
RDW: 15.3 % (ref 11.5–15.5)
WBC: 7.8 10*3/uL (ref 4.0–10.5)
nRBC: 0 % (ref 0.0–0.2)

## 2021-06-18 LAB — I-STAT BETA HCG BLOOD, ED (MC, WL, AP ONLY): I-stat hCG, quantitative: <5 m[IU]/mL (ref <5)

## 2021-06-18 LAB — RESP PANEL BY RT-PCR (FLU A&B, COVID) ARPGX2
Influenza A by PCR: NEGATIVE
Influenza B by PCR: NEGATIVE
SARS Coronavirus 2 by RT PCR: NEGATIVE

## 2021-06-18 LAB — D-DIMER, QUANTITATIVE: D-Dimer, Quant: <0.27 ug/mL-FEU (ref 0.00–0.50)

## 2021-06-18 MED ORDER — SUMATRIPTAN SUCCINATE 100 MG PO TABS
100.0000 mg | ORAL_TABLET | Freq: Every day | ORAL | Status: DC | PRN
Start: 2021-06-18 — End: 2021-06-21
  Filled 2021-06-18: qty 1

## 2021-06-18 MED ORDER — KETOROLAC TROMETHAMINE 15 MG/ML IJ SOLN
15.0000 mg | Freq: Once | INTRAMUSCULAR | Status: AC
  Administered 2021-06-18: 15 mg via INTRAVENOUS
  Filled 2021-06-18: qty 1

## 2021-06-18 MED ORDER — IOHEXOL 350 MG/ML SOLN
100.0000 mL | Freq: Once | INTRAVENOUS | Status: AC | PRN
  Administered 2021-06-18: 100 mL via INTRAVENOUS

## 2021-06-18 MED ORDER — LAMOTRIGINE 100 MG PO TABS
100.0000 mg | ORAL_TABLET | Freq: Every day | ORAL | Status: DC
Start: 2021-06-19 — End: 2021-06-21
  Administered 2021-06-19 – 2021-06-20 (×2): 100 mg via ORAL
  Filled 2021-06-18 (×2): qty 1

## 2021-06-18 MED ORDER — HYDROMORPHONE HCL 1 MG/ML IJ SOLN
1.0000 mg | Freq: Once | INTRAMUSCULAR | Status: AC
  Administered 2021-06-18: 1 mg via INTRAVENOUS
  Filled 2021-06-18: qty 1

## 2021-06-18 MED ORDER — ONDANSETRON HCL 4 MG/2ML IJ SOLN: 4.0000 mg | Freq: Four times a day (QID) | INTRAMUSCULAR | Status: DC | PRN

## 2021-06-18 MED ORDER — PRAZOSIN HCL 2 MG PO CAPS
5.0000 mg | ORAL_CAPSULE | Freq: Every day | ORAL | Status: DC
  Administered 2021-06-18 – 2021-06-19 (×2): 5 mg via ORAL
  Filled 2021-06-18 (×3): qty 1

## 2021-06-18 MED ORDER — PANTOPRAZOLE SODIUM 20 MG PO TBEC
20.0000 mg | DELAYED_RELEASE_TABLET | Freq: Every morning | ORAL | Status: DC
  Administered 2021-06-19: 20 mg via ORAL
  Filled 2021-06-18: qty 1

## 2021-06-18 MED ORDER — LIDOCAINE 5 % EX PTCH
1.0000 | MEDICATED_PATCH | CUTANEOUS | Status: DC
  Administered 2021-06-18: 1 via TRANSDERMAL
  Filled 2021-06-18: qty 1

## 2021-06-18 MED ORDER — QUETIAPINE FUMARATE 100 MG PO TABS
300.0000 mg | ORAL_TABLET | Freq: Every day | ORAL | Status: DC
  Administered 2021-06-18 – 2021-06-19 (×2): 300 mg via ORAL
  Filled 2021-06-18 (×2): qty 3
  Filled 2021-06-18: qty 1

## 2021-06-18 MED ORDER — HYDROMORPHONE HCL 1 MG/ML IJ SOLN
0.5000 mg | Freq: Once | INTRAMUSCULAR | Status: AC
  Administered 2021-06-18: 0.5 mg via INTRAVENOUS
  Filled 2021-06-18: qty 1

## 2021-06-18 MED ORDER — MORPHINE SULFATE (PF) 2 MG/ML IV SOLN
2.0000 mg | INTRAVENOUS | Status: DC | PRN
  Administered 2021-06-18 – 2021-06-20 (×2): 2 mg via INTRAVENOUS
  Filled 2021-06-18 (×3): qty 1

## 2021-06-18 MED ORDER — SENNOSIDES-DOCUSATE SODIUM 8.6-50 MG PO TABS
1.0000 | ORAL_TABLET | Freq: Every day | ORAL | Status: DC
  Administered 2021-06-18 – 2021-06-19 (×2): 1 via ORAL
  Filled 2021-06-18 (×2): qty 1

## 2021-06-18 MED ORDER — ENOXAPARIN SODIUM 40 MG/0.4ML IJ SOSY
40.0000 mg | PREFILLED_SYRINGE | INTRAMUSCULAR | Status: DC
  Administered 2021-06-18 – 2021-06-19 (×2): 40 mg via SUBCUTANEOUS
  Filled 2021-06-18 (×2): qty 0.4

## 2021-06-18 NOTE — ED Provider Notes (Signed)
?[facility] ?Provider Note ? ? ?CSN: [id number] ?Arrival date & time: 06/18/21  1414 ? ?  ? ?History ? ?Chief Complaint  ?Patient presents with  ? Chest Pain  ? ? ?Heather Figueroa is a 51 y.o. female.  Patient presents to the emergency department via EMS complaining of chest pain, chest tightness, and difficulty breathing that started last night and worsened this morning.  Patient states that she was rolling over and felt an extremely sharp pain in her right chest.  Patient states she immediately became short of breath.  Patient states she is also felt like she may have had cold sweats after the pain began.  Patient rates the pain as a 10 out of 10.  States that the pain radiates into the right side of her neck.  Patient tried nothing at home and nothing seem to make the pain better.  She does state that certain movements seem to make the pain worse.  Medical history significant for depression, headache, arthritis, anemia, anxiety, history of stroke, history of seizures, neuromuscular disorder ? ?HPI ? ?  ? ?Home Medications ?Prior to Admission medications   ?Medication Sig Start Date End Date Taking? Authorizing Provider  ?lamoTRIgine (LAMICTAL) 100 MG tablet Take 100 mg by mouth daily.   Yes [provider]  ?ondansetron (ZOFRAN-ODT) 4 MG disintegrating tablet Take 4 mg by mouth every 8 (eight) hours as needed. 05/11/21  Yes [provider]  ?pantoprazole (PROTONIX) 20 MG tablet Take 20 mg by mouth every morning. 05/11/21  Yes [provider]  ?prazosin (MINIPRESS) 5 MG capsule Take 5 mg by mouth at bedtime.   Yes [provider]  ?QUEtiapine (SEROQUEL) 300 MG tablet Take 300 mg by mouth at bedtime.   Yes [provider]  ?rizatriptan (MAXALT) 10 MG tablet Take 10 mg by mouth as needed for migraine.    Yes [provider]  ?HYDROmorphone (DILAUDID) 2 MG tablet Take 1 tablet (2 mg total) by mouth every 6 (six) hours as needed  for severe pain. ?Patient not taking: Reported on 06/18/2021 01/28/17   Eustace Moore, MD  ?hydrOXYzine (VISTARIL) 50 MG capsule Take 1 capsule (50 mg total) by mouth 3 (three) times daily as needed for itching. ?Patient not taking: Reported on 06/18/2021 03/20/17   Darel Hong, MD  ?   ? ?Allergies    ?Pentazocine, Percocet [oxycodone-acetaminophen], and Tylenol [acetaminophen]   ? ?Review of Systems   ?Review of Systems  ?Constitutional:  Negative for fever.  ?Respiratory:  Positive for chest tightness and shortness of breath.   ?Cardiovascular:  Positive for chest pain and palpitations.  ?Gastrointestinal:  Negative for abdominal pain, nausea and vomiting.  ? ?Physical Exam ?Updated Vital Signs ?BP (!) 171/107   Pulse 72   Temp 98.1 ?F (36.7 ?C) (Oral)   Resp 13   Ht '5\' 5"'$  (1.651 m)   Wt 69.9 kg   SpO2 97%   BMI 25.64 kg/m?  ?Physical Exam ?Vitals and nursing note reviewed.  ?Constitutional:   ?   Appearance: She is normal weight.  ?   Comments: Patient appears to be in pain  ?HENT:  ?   Head: Normocephalic and atraumatic.  ?Eyes:  ?   Pupils: Pupils are equal, round, and reactive to light.  ?Cardiovascular:  ?   Rate and Rhythm: Normal rate and regular rhythm.  ?   Heart sounds: Normal heart sounds.  ?Pulmonary:  ?   Effort: Pulmonary effort is  normal.  ?   Breath sounds: Normal breath sounds.  ?Chest:  ?   Chest wall: Tenderness present.  ?Abdominal:  ?   Palpations: Abdomen is soft.  ?   Tenderness: There is no abdominal tenderness.  ?Musculoskeletal:  ?   Cervical back: Normal range of motion and neck supple.  ?Skin: ?   General: Skin is warm and dry.  ?Neurological:  ?   Mental Status: She is alert and oriented to person, place, and time.  ? ? ?ED Results / Procedures / Treatments   ?Labs ?(all labs ordered are listed, but only abnormal results are displayed) ?Labs Reviewed  ?BASIC METABOLIC PANEL - Abnormal; Notable for the following components:  ?    Result Value  ? Glucose, Bld 101 (*)   ? All  other components within normal limits  ?CBC - Abnormal; Notable for the following components:  ? RBC 5.20 (*)   ? MCV 78.8 (*)   ? All other components within normal limits  ?RESP PANEL BY RT-PCR (FLU A&B, COVID) ARPGX2  ?I-STAT BETA HCG BLOOD, ED ([facility], [facility], [facility] ONLY)  ?TROPONIN I (HIGH SENSITIVITY)  ?TROPONIN I (HIGH SENSITIVITY)  ? ? ?EKG ?None ? ?Radiology ?DG Chest 2 View ? ?Result Date: 06/18/2021 ?CLINICAL DATA:  Short of breath and chest tightness EXAM: CHEST - 2 VIEW COMPARISON:  08/14/2018 FINDINGS: Cervical spine fixation.  Minimal pectus excavatum deformity. Midline trachea.  Normal heart size and mediastinal contours. Sharp costophrenic angles.  No pneumothorax.  Clear lungs. Minimal S shaped thoracolumbar spine curvature. IMPRESSION: No active cardiopulmonary disease. Electronically Signed   By: Abigail Miyamoto M.D.   On: 06/18/2021 15:02   ? ?Procedures ?Procedures  ? ? ?Medications Ordered in ED ?Medications  ?HYDROmorphone (DILAUDID) injection 1 mg (1 mg Intravenous Given 06/18/21 1557)  ?HYDROmorphone (DILAUDID) injection 0.5 mg (0.5 mg Intravenous Given 06/18/21 1651)  ? ? ?ED Course/ Medical Decision Making/ A&P ?  ?                        ?Medical Decision Making ?Amount and/or Complexity of Data Reviewed ?Radiology: ordered. ? ?Risk ?Prescription drug management. ? ? ?This patient presents to the ED for concern of chest pain, this involves an extensive number of treatment options, and is a complaint that carries with it a high risk of complications and morbidity.  The differential diagnosis includes ACS, PE, dissection, musculoskeletal pain, and others ? ? ?Co morbidities that complicate the patient evaluation ? ?History of stroke, smoking ? ? ?Additional history obtained: ? ?Additional history obtained from EMS ?External records from outside source obtained and reviewed including care everywhere notes ? ? ?Lab Tests: ? ?I Ordered, and personally interpreted labs.  The pertinent results include: Delta  troponins both 4 ? ? ?Imaging Studies ordered: ? ?I ordered imaging studies including chest x-ray and CT dissection study   ?I independently visualized and interpreted imaging which showed no active disease on chest x-ray, CT pending ?I agree with the radiologist interpretation ? ? ?Cardiac Monitoring: / EKG: ? ?The patient was maintained on a cardiac monitor.  I personally viewed and interpreted the cardiac monitored which showed an underlying rhythm of: Sinus rhythm ? ?Problem List / ED Course / Critical interventions / Medication management ? ? ?I ordered medication including Dilaudid for pain ?Reevaluation of the patient after these medicines showed that the patient improved ?I have reviewed the patients home medicines and have made adjustments as needed ? ? ? ?  Test / Admission - Considered: ? ?Troponins and EKG reassuring for a low probability of ACS.  Patient's pain seems out of proportion to her history.  Possible severe musculoskeletal pain, possible other etiology at this time including GERD, positive result from dissection study, and others.  CT dissection study pending ? ?Patient care being handed off to Teressa Lower, MD at shift change ? ?Final Clinical Impression(s) / ED Diagnoses ?Final diagnoses:  ?None  ? ? ?Rx / DC Orders ?ED Discharge Orders   ? ? None  ? ?  ? ? ?  ?Dorothyann Peng, PA-C ?06/18/21 1857 ? ?  ?Teressa Lower, MD ?06/18/21 2124 ? ?

## 2021-06-18 NOTE — ED Provider Triage Note (Signed)
Emergency Medicine Provider Triage Evaluation Note ? ?Heather Figueroa , a 51 y.o. female  was evaluated in triage.  Pt complains of chest pain, chest tightness, difficulty breathing that started last night and worsened this morning.  Patient denies association with exertion.  She reports that she had some cold sweats with it.  She denies cough or difficulty breathing prior to yesterday.  He does endorse heavy tobacco use smoking at least a pack per day.  She reports history of previous stroke, does not take any blood thinners at this time.  No previous cardiology assessment, hypertension, diabetes, high cholesterol.  Patient reports that it is central to right-sided, with no radiation to the arms or neck. ? ?Review of Systems  ?Positive: Chest pain, shob ?Negative: Nausea, vomiting, arm pain, neck pain, fever, chills ? ?Physical Exam  ?BP (!) 136/97 (BP Location: Right Arm)   Pulse 77   Temp 98.1 ?F (36.7 ?C) (Oral)   Resp 16   Ht '5\' 5"'$  (1.651 m)   Wt 69.9 kg   SpO2 97%   BMI 25.64 kg/m?  ?Gen:   Awake, no distress   ?Resp:  Normal effort  ?MSK:   Moves extremities without difficulty  ?Other:  Some wheezing / consolidation noted on right middle lung fields ? ?Medical Decision Making  ?Medically screening exam initiated at 2:35 PM.  Appropriate orders placed.  SARABELLA CAPRIO was informed that the remainder of the evaluation will be completed by another provider, this initial triage assessment does not replace that evaluation, and the importance of remaining in the ED until their evaluation is complete. ? ?Workup initiated ?  ?Anselmo Pickler, PA-C ?06/18/21 1436 ? ?

## 2021-06-18 NOTE — Assessment & Plan Note (Addendum)
Severe CP, persistent in ED. ?-Likely atypical chest pain versus musculoskeletal versus costochondritis in nature.  Patient also noted with abnormal MRI of the C-spine however states no significant change with chronic neck pain and chronic right upper extremity pain ?CTA neg for dissection or PE ?-High-sensitivity troponin done was negative. ?-EKG with no ischemic changes noted. ?-Patient seen in consultation by cardiology and patient underwent a pharmacological stress test which was low risk per cardiology. ?-2D echo with EF 60 to 65%,NWMA, evidence of atrial level shunting detected by color-flow Doppler to suggest possible ASD. ?-Patient with exquisite tenderness to palpation right anterior chest wall initially on presentation with concern for possible musculoskeletal etiology. ?-Patient initially had Lidoderm patch placed which was subsequently discontinued. ?-Sed rate,  CRP within normal limits. ?-Patient with no EKG changes or 2D echo findings suggestive of pericardial effusion or pericarditis. ?-Patient initially placed on Lidoderm patch with no improvement subsequently discontinued. ?-Patient also placed on scheduled ibuprofen 600 mg 3 times daily.  ?-MRI of the C-spine done status post ACDF C4-C7, C3-C4 moderate to severe spinal canal stenosis and mild bilateral neural foraminal narrowing, C2-C3 and C7-T1 mild right neuroforaminal narrowing. ?-Patient seen in consultation by neurosurgery who reviewed MRI films, assessed patient and felt patient likely had costochondritis and subsequently placed patient on IV Decadron and prescribed a Medrol Dosepak sent directly to her pharmacy on discharge . ?-Patient improved clinically however still had ongoing anterior chest wall pain and will be discharged home in stable and improved condition on Medrol Dosepak.   ?-Outpatient follow-up with PCP.  ?

## 2021-06-18 NOTE — H&P (Signed)
?History and Physical  ? ? ?Patient: Heather Figueroa ZOX:[id number] DOB: 19-May-1970 ?DOA: 06/18/2021 ?DOS: the patient was seen and examined on 06/18/2021 ?PCP: Marliss Coots, NP  ?Patient coming from: Home ? ?Chief Complaint:  ?Chief Complaint  ?Patient presents with  ? Chest Pain  ? ?HPI: Heather Figueroa is a 51 y.o. female with medical history significant of seizure disorder. ? ?Pt presents to ED with c/o CP and tightness, SOB. ? ?Symptoms onset last night suddenly.  Worsened this AM. ? ? Patient states that she was rolling over and felt an extremely sharp pain in her right chest.  Patient states she immediately became short of breath.  Patient states she is also felt like she may have had cold sweats after the pain began.  Patient rates the pain as a 10 out of 10.  States that the pain radiates into the right side of her neck.  Patient tried nothing at home and nothing seem to make the pain better.  She does state that certain movements seem to make the pain worse. ? ?  ?Review of Systems: As mentioned in the history of present illness. All other systems reviewed and are negative. ?Past Medical History:  ?Diagnosis Date  ? Anemia   ? Anxiety   ? Arthritis   ? Depression   ? Headache   ? Neuromuscular disorder ([place])   ? Seizures ([place])   ? pt has had 2 seizures in July 2018  ? Stroke [facility])   ? possible TIA in July 2018  ? ?Past Surgical History:  ?Procedure Laterality Date  ? ABDOMINAL HYSTERECTOMY    ? 2002  ? ANTERIOR CERVICAL DECOMP/DISCECTOMY FUSION N/A 01/26/2017  ? Procedure: ANTERIOR CERVICAL DECOMPRESSION/DISCECTOMY FUSION, INTERBODY PROSTHESIS, PLATE/SCREWS, POSSIBLE CORPECTOMY CERVICAL FOUR- CERVICAL FIVE, CERVICAL FIVE- CERVICAL SIX, CERVICAL SIX- CERVICAL SEVEN;  Surgeon: Newman Pies, MD;  Location: [place];  Service: Neurosurgery;  Laterality: N/A;  ANTERIOR CERVICAL DECOMPRESSION/DISCECTOMY FUSION, INTERBODY PROSTHESIS, PLATE/SCREWS, POSSIBLE CORPE  ? APPENDECTOMY    ? 2010  ? OVARIAN CYST  REMOVAL    ? TUBAL LIGATION    ? 1998  ? ?Social History:  reports that she has been smoking cigarettes. She has been smoking an average of 1 pack per day. She has never used smokeless tobacco. She reports that she does not drink alcohol and does not use drugs. ? ?Allergies  ?Allergen Reactions  ? Pentazocine Anaphylaxis  ? Percocet [Oxycodone-Acetaminophen] Other (See Comments)  ?  Swelling generalized  ? Tylenol [Acetaminophen] Swelling  ? ? ?Family History  ?Problem Relation Age of Onset  ? Colon cancer Mother   ? Pulmonary embolism Sister   ? Bladder Cancer Maternal Grandmother   ? ? ?Prior to Admission medications   ?Medication Sig Start Date End Date Taking? Authorizing Provider  ?lamoTRIgine (LAMICTAL) 100 MG tablet Take 100 mg by mouth daily.   Yes [provider]  ?ondansetron (ZOFRAN-ODT) 4 MG disintegrating tablet Take 4 mg by mouth every 8 (eight) hours as needed. 05/11/21  Yes [provider]  ?pantoprazole (PROTONIX) 20 MG tablet Take 20 mg by mouth every morning. 05/11/21  Yes [provider]  ?prazosin (MINIPRESS) 5 MG capsule Take 5 mg by mouth at bedtime.   Yes [provider]  ?QUEtiapine (SEROQUEL) 300 MG tablet Take 300 mg by mouth at bedtime.   Yes [provider]  ?rizatriptan (MAXALT) 10 MG tablet Take 10 mg by mouth as needed for migraine.    Yes [provider]  ? ? ?Physical Exam: ?Vitals:  ? 06/18/21 1800 06/18/21 1835 06/18/21 2000 06/18/21 2030  ?BP: 136/87 (!) 157/91 138/82 (!) 157/106  ?Pulse: 70 73 73 66  ?Resp: '18 12 14 14  '$ ?Temp:      ?TempSrc:      ?SpO2: 97% 95% 98% 98%  ?Weight:      ?Height:      ? ?Constitutional: Uncomfortable in pain ?Eyes: PERRL, lids and conjunctivae normal ?ENMT: Mucous membranes are moist. Posterior pharynx clear of any exudate or lesions.Normal dentition.  ?Neck: normal, supple, no masses, no thyromegaly ?Respiratory: clear to auscultation bilaterally, no wheezing, no crackles. Normal respiratory  effort. No accessory muscle use.  ?Cardiovascular: Regular rate and rhythm, no murmurs / rubs / gallops. No extremity edema. 2+ pedal pulses. No carotid bruits.  ?Abdomen: no tenderness, no masses palpated. No hepatosplenomegaly. Bowel sounds positive.  ?Musculoskeletal: no clubbing / cyanosis. No joint deformity upper and lower extremities. Good ROM, no contractures. Normal muscle tone.  ?Skin: no rashes, lesions, ulcers. No induration ?Neurologic: CN 2-12 grossly intact. Sensation intact, DTR normal. Strength 5/5 in all 4.  ?Psychiatric: Normal judgment and insight. Alert and oriented x 3. Normal mood.  ? ?Data Reviewed: ? ?Trop neg x2 ?CTA CAP for dissection: ?1) no dissection ?2) no PE ?3) mod stool burden ?4) no obstruction ? ?Assessment and Plan: ?* Chest pain, rule out acute myocardial infarction ?Severe CP, persistent in ED. ?CTA neg for dissection or PE ?? Radiculopathy from neck causing pain perhaps? Pt does have h/o ACDF previously. ?Cards consulted, will see in AM: ?Requested 2d echo ordered ?CP obs pathway ?NPO after MN ?Serial trops, neg thus far ?Tele monitor ?Morphine PRN pain ? ?Seizure disorder ([place]) ?Cont home seizure meds ? ? ? ? ? Advance Care Planning:   Code Status: Full Code  ? ?Consults: EDP spoke with cards ? ?Family Communication: No family in room ? ?Severity of Illness: ?The appropriate patient status for this patient is OBSERVATION. Observation status is judged to be reasonable and necessary in order to provide the required intensity of service to ensure the patient's safety. The patient's presenting symptoms, physical exam findings, and initial radiographic and laboratory data in the context of their medical condition is felt to place them at decreased risk for further clinical deterioration. Furthermore, it is anticipated that the patient will be medically stable for discharge from the hospital within 2 midnights of admission.  ? ?Author: ?Etta Quill., DO ?06/18/2021 8:52  PM ? ?For on call review [email or web address].  ?

## 2021-06-18 NOTE — Assessment & Plan Note (Addendum)
-   Patient maintained on home regimen Lamictal.   ?-Outpatient follow-up. ?

## 2021-06-18 NOTE — ED Triage Notes (Signed)
Pt BIB EMS from home c/o CP radiating to back. Worse on palpation. 1 nitro, 324 ASA given by EMS with no relief. Denies N/V.  Reports chest heaviness since last night.  ?

## 2021-06-18 NOTE — ED Notes (Signed)
Patient transported to CT 

## 2021-06-19 ENCOUNTER — Observation Stay (HOSPITAL_COMMUNITY): Payer: Medicaid Other

## 2021-06-19 DIAGNOSIS — R079 Chest pain, unspecified: Secondary | ICD-10-CM | POA: Diagnosis not present

## 2021-06-19 DIAGNOSIS — G40909 Epilepsy, unspecified, not intractable, without status epilepticus: Secondary | ICD-10-CM | POA: Diagnosis not present

## 2021-06-19 DIAGNOSIS — E876 Hypokalemia: Secondary | ICD-10-CM | POA: Diagnosis present

## 2021-06-19 LAB — MAGNESIUM: Magnesium: 2 mg/dL (ref 1.7–2.4)

## 2021-06-19 LAB — ECHOCARDIOGRAM COMPLETE
Area-P 1/2: 3.6 cm2
Height: 61 in
S' Lateral: 2.6 cm
Weight: 2447.99 oz

## 2021-06-19 LAB — HEMOGLOBIN A1C
Hgb A1c MFr Bld: 6 % — ABNORMAL HIGH (ref 4.8–5.6)
Mean Plasma Glucose: 125.5 mg/dL

## 2021-06-19 LAB — MRSA NEXT GEN BY PCR, NASAL: MRSA by PCR Next Gen: NOT DETECTED

## 2021-06-19 LAB — BASIC METABOLIC PANEL
Anion gap: 7 (ref 5–15)
BUN: 10 mg/dL (ref 6–20)
CO2: 24 mmol/L (ref 22–32)
Calcium: 9 mg/dL (ref 8.9–10.3)
Chloride: 105 mmol/L (ref 98–111)
Creatinine, Ser: 0.65 mg/dL (ref 0.44–1.00)
GFR, Estimated: >60 mL/min (ref >60)
Glucose, Bld: 108 mg/dL — ABNORMAL HIGH (ref 70–99)
Potassium: 3.2 mmol/L — ABNORMAL LOW (ref 3.5–5.1)
Sodium: 136 mmol/L (ref 135–145)

## 2021-06-19 LAB — LIPID PANEL
Cholesterol: 160 mg/dL (ref 0–200)
HDL: 37 mg/dL — ABNORMAL LOW (ref >40)
LDL Cholesterol: 98 mg/dL (ref 0–99)
Total CHOL/HDL Ratio: 4.3 RATIO
Triglycerides: 123 mg/dL (ref <150)
VLDL: 25 mg/dL (ref 0–40)

## 2021-06-19 LAB — SEDIMENTATION RATE: Sed Rate: 3 mm/hr (ref 0–22)

## 2021-06-19 LAB — HIV ANTIBODY (ROUTINE TESTING W REFLEX): HIV Screen 4th Generation wRfx: NONREACTIVE

## 2021-06-19 LAB — LDL CHOLESTEROL, DIRECT: Direct LDL: 89.4 mg/dL (ref 0–99)

## 2021-06-19 LAB — TROPONIN I (HIGH SENSITIVITY): Troponin I (High Sensitivity): 5 ng/L (ref <18)

## 2021-06-19 LAB — TSH: TSH: 1.428 u[IU]/mL (ref 0.350–4.500)

## 2021-06-19 LAB — C-REACTIVE PROTEIN: CRP: 0.5 mg/dL (ref <1.0)

## 2021-06-19 MED ORDER — TECHNETIUM TC 99M TETROFOSMIN IV KIT
10.2000 | PACK | Freq: Once | INTRAVENOUS | Status: AC | PRN
  Administered 2021-06-19: 10.2 via INTRAVENOUS

## 2021-06-19 MED ORDER — REGADENOSON 0.4 MG/5ML IV SOLN
INTRAVENOUS | Status: AC
  Filled 2021-06-19: qty 5

## 2021-06-19 MED ORDER — IBUPROFEN 200 MG PO TABS
600.0000 mg | ORAL_TABLET | Freq: Three times a day (TID) | ORAL | Status: DC
  Administered 2021-06-19 – 2021-06-20 (×2): 600 mg via ORAL
  Filled 2021-06-19 (×2): qty 3

## 2021-06-19 MED ORDER — DEXAMETHASONE SODIUM PHOSPHATE 4 MG/ML IJ SOLN
4.0000 mg | Freq: Four times a day (QID) | INTRAMUSCULAR | Status: DC
  Administered 2021-06-19 – 2021-06-20 (×5): 4 mg via INTRAVENOUS
  Filled 2021-06-19 (×8): qty 1

## 2021-06-19 MED ORDER — PANTOPRAZOLE SODIUM 40 MG PO TBEC
40.0000 mg | DELAYED_RELEASE_TABLET | Freq: Every day | ORAL | Status: DC
  Administered 2021-06-20: 40 mg via ORAL
  Filled 2021-06-19: qty 1

## 2021-06-19 MED ORDER — TECHNETIUM TC 99M TETROFOSMIN IV KIT: 32.7000 | PACK | Freq: Once | INTRAVENOUS | Status: DC | PRN

## 2021-06-19 MED ORDER — TRAMADOL HCL 50 MG PO TABS
50.0000 mg | ORAL_TABLET | Freq: Three times a day (TID) | ORAL | Status: DC
  Administered 2021-06-19: 50 mg via ORAL
  Filled 2021-06-19: qty 1

## 2021-06-19 MED ORDER — REGADENOSON 0.4 MG/5ML IV SOLN
0.4000 mg | Freq: Once | INTRAVENOUS | Status: AC
  Administered 2021-06-19: 0.4 mg via INTRAVENOUS
  Filled 2021-06-19: qty 5

## 2021-06-19 MED ORDER — METHYLPREDNISOLONE 4 MG PO TBPK: ORAL_TABLET | ORAL | 0 refills | Status: DC

## 2021-06-19 NOTE — Progress Notes (Signed)
?  Echocardiogram ?2D Echocardiogram has been performed. ? ?Heather Figueroa ? ?   ? ?Heather Figueroa ?

## 2021-06-19 NOTE — Consult Note (Signed)
CARDIOLOGY CONSULT NOTE  ?Patient ID: ?[name] ?MRN: [id number] ?DOB/AGE: [birth year] 51 y.o. ? ?Admit date: 06/18/2021 ?Attending physician: Eugenie Filler, MD ?Primary Physician:  Marliss Coots, NP ?Outpatient Cardiologist: NA ?Inpatient Cardiologist: Rex Kras, DO, [facility] ? ?Reason of consultation: Chest Pain.  ?Referring physician: Dr. Debe Coder Kommor  ? ?Chief complaint: Chest Pain.  ? ?HPI:  ?Heather Figueroa is a 51 y.o. African-American female who presents with a chief complaint of "chest pain." Her past medical history and cardiovascular risk factors include: Prediabetic, history of TIA, cigarette smoker (1 pack/day), history of seizures, history of neuromuscular disorder. ? ?Presents to the hospital with a chief complaint of chest pain that started approximately 2 days ago while watching TV.  The initial symptoms lasted for several hours, intermittently present, right-sided, intensity 10 out of 10, describes it as "elephant sitting on my chest."  The discomfort is nonexertional, nonpositional, at times pleuritic. ? ?No prior cardiovascular work-up. ? ?Cardiology was asked to evaluate the patient by the emergency room department physician due to elevated heart score and ongoing chest pain despite high sensitive troponins negative x3, D-dimer negative, CT chest abdomen pelvis for dissection unremarkable. ? ?On evaluation this morning patient was resting in bed comfortably.  When asked if he has any discomfort she says "yes" still located over the right precordium. ? ?ALLERGIES: ?Allergies  ?Allergen Reactions  ? Pentazocine Anaphylaxis  ? Percocet [Oxycodone-Acetaminophen] Other (See Comments)  ?  Swelling generalized  ? Tylenol [Acetaminophen] Swelling  ? ? ?PAST MEDICAL HISTORY: ?Past Medical History:  ?Diagnosis Date  ? Anemia   ? Anxiety   ? Arthritis   ? Depression   ? Headache   ? Neuromuscular disorder ([place])   ? Seizures ([employer])   ? pt has had 2 seizures in July 2018  ? Stroke [facility])   ?  possible TIA in July 2018  ? ? ?PAST SURGICAL HISTORY: ?Past Surgical History:  ?Procedure Laterality Date  ? ABDOMINAL HYSTERECTOMY    ? 2002  ? ANTERIOR CERVICAL DECOMP/DISCECTOMY FUSION N/A 01/26/2017  ? Procedure: ANTERIOR CERVICAL DECOMPRESSION/DISCECTOMY FUSION, INTERBODY PROSTHESIS, PLATE/SCREWS, POSSIBLE CORPECTOMY CERVICAL FOUR- CERVICAL FIVE, CERVICAL FIVE- CERVICAL SIX, CERVICAL SIX- CERVICAL SEVEN;  Surgeon: Newman Pies, MD;  Location: [place];  Service: Neurosurgery;  Laterality: N/A;  ANTERIOR CERVICAL DECOMPRESSION/DISCECTOMY FUSION, INTERBODY PROSTHESIS, PLATE/SCREWS, POSSIBLE CORPE  ? APPENDECTOMY    ? 2010  ? OVARIAN CYST REMOVAL    ? TUBAL LIGATION    ? 1998  ? ? ?FAMILY HISTORY: ?The patient's family history includes Bladder Cancer in her maternal grandmother; Colon cancer in her mother; Pulmonary embolism in her sister. ?  ?SOCIAL HISTORY:  ?The patient  reports that she has been smoking cigarettes. She has been smoking an average of 1 pack per day. She has never used smokeless tobacco. She reports that she does not drink alcohol and does not use drugs. ? ?MEDICATIONS: ?Current Outpatient Medications  ?Medication Instructions  ? lamoTRIgine (LAMICTAL) 100 mg, Oral, Daily  ? ondansetron (ZOFRAN-ODT) 4 mg, Oral, Every 8 hours PRN  ? pantoprazole (PROTONIX) 20 mg, Oral, Every morning  ? prazosin (MINIPRESS) 5 mg, Oral, Daily at bedtime  ? QUEtiapine (SEROQUEL) 300 mg, Oral, Daily at bedtime  ? rizatriptan (MAXALT) 10 mg, Oral, As needed  ? ? ?REVIEW OF SYSTEMS: ?ROS ? ?PHYSICAL EXAM: ? ?  06/19/2021  ?  7:54 AM 06/19/2021  ?  3:07 AM 06/19/2021  ? 12:07 AM  ?Vitals with BMI  ?Systolic 938 182  616  ?Diastolic 58 65 78  ?Pulse 84 65 87  ? ? ? ?Intake/Output Summary (Last 24 hours) at 06/19/2021 0757 ?Last data filed at 06/18/2021 2208 ?Gross per 24 hour  ?Intake 360 ml  ?Output --  ?Net 360 ml  ?  ?Net IO Since Admission: 360 mL [06/19/21 0757] ? ?CONSTITUTIONAL: Appears older than stated age,  hemodynamically stable, no acute distress.  ?SKIN: Skin is warm and dry. No rash noted. No cyanosis. No pallor. No jaundice ?HEAD: Normocephalic and atraumatic.  ?EYES: No scleral icterus ?MOUTH/THROAT: Moist oral membranes.  ?NECK: No JVD present. No thyromegaly noted. No carotid bruits  ?CHEST Normal respiratory effort. No intercostal retractions  ?LUNGS: Clear to auscultation bilaterally no wheezes rales or rhonchi's.   ?CARDIOVASCULAR: Regular, positive S1-S2, no murmurs rubs or gallops appreciated. ?ABDOMINAL: Soft, nontender, nondistended, positive bowel sounds in all 4 quadrants, no apparent ascites.  ?EXTREMITIES: No pitting edema, warm to touch, 2+ bilateral DP and PT pulses.  ?HEMATOLOGIC: No significant bruising ?NEUROLOGIC: Oriented to person, place, and time. Nonfocal. Normal muscle tone.  ?PSYCHIATRIC: Normal mood and affect. Normal behavior. Cooperative ? ?RADIOLOGY: ?DG Chest 2 View ? ?Result Date: 06/18/2021 ?CLINICAL DATA:  Short of breath and chest tightness EXAM: CHEST - 2 VIEW COMPARISON:  08/14/2018 FINDINGS: Cervical spine fixation.  Minimal pectus excavatum deformity. Midline trachea.  Normal heart size and mediastinal contours. Sharp costophrenic angles.  No pneumothorax.  Clear lungs. Minimal S shaped thoracolumbar spine curvature. IMPRESSION: No active cardiopulmonary disease. Electronically Signed   By: Abigail Miyamoto M.D.   On: 06/18/2021 15:02  ? ?MR Cervical Spine Wo Contrast ? ?Result Date: 06/19/2021 ?CLINICAL DATA:  Cervical radiculopathy EXAM: MRI CERVICAL SPINE WITHOUT CONTRAST TECHNIQUE: Multiplanar, multisequence MR imaging of the cervical spine was performed. No intravenous contrast was administered. COMPARISON:  None available. Correlation is made with CT cervical spine 01/08/2010 and cervical spine radiographs 03/01/2017 FINDINGS: Alignment: Straightening of the normal cervical lordosis. No listhesis. Vertebrae: Status post ACDF C4-C7, with associated susceptibility artifact. No  evidence of acute fracture or suspicious osseous lesion. Congenitally short pedicles, which narrow the AP diameter of the spinal canal. Cord: Bilateral, symmetric, circular foci of T2 hyperintense signal in the anterior horn cells of the spinal cord at C5 (series 9, images 20 3-25). Otherwise normal in signal. Normal in caliber with cord flattening C4-C6. Posterior Fossa, vertebral arteries, paraspinal tissues: Negative. Disc levels: C2-C3: Mild disc bulge. Right-greater-than-left facet arthropathy. No spinal canal stenosis. Mild right neural foraminal narrowing. C3-C4: Broad-based disc bulge. Facet and uncovertebral hypertrophy. Moderate to severe spinal canal stenosis. Mild bilateral neural foraminal narrowing. C4-C5: Status post ACDF. No spinal canal stenosis or neural foraminal narrowing. C5-C6: Status post ACDF, with midline osseous hypertrophy posterior to the C5 vertebral body, which indents and deforms the ventral cord (series 9, image 22). At the disc level, there is no spinal canal stenosis or neural foraminal narrowing. C6-C7: Status post ACDF. Smaller midline osseous hypertrophy posterior to the C6 vertebral body. No spinal canal stenosis or neural foraminal narrowing. C7-T1: Mild disc bulge. Uncovertebral and facet arthropathy. No spinal canal stenosis. Mild right neural foraminal narrowing. IMPRESSION: 1. Status post ACDF C4-C7, with no residual spinal canal stenosis or neural foraminal narrowing at these levels. 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 2. C3-C4 moderate to severe spinal canal stenosis and mild bilateral neural foraminal narrowing. 3. C2-C3 and C7-T1 mild right  neural foraminal narrowing. Electronically Signed   By: Merilyn Baba M.D.   On: 06/19/2021 03:22  ? ?CT Angio Chest/Abd/Pel for Dissection W and/or Wo Contrast ? ?Result Date: 06/18/2021 ?CLINICAL DATA:  Aortic aneury

## 2021-06-19 NOTE — Plan of Care (Signed)

## 2021-06-19 NOTE — Progress Notes (Addendum)
?PROGRESS NOTE ? ? ? ?Heather Figueroa  PJK:[id number] DOB: Jun 30, 1970 DOA: 06/18/2021 ?PCP: Marliss Coots, NP  ? ? ?Chief Complaint  ?Patient presents with  ? Chest Pain  ? ? ?Brief Narrative:  ?No notes on file  ? ? ?Assessment & Plan: ? Principal Problem: ?  Chest pain, rule out acute myocardial infarction ?Active Problems: ?  Seizure disorder ([place]) ?  Hypokalemia ? ? ? ?Assessment and Plan: ?* Chest pain, rule out acute myocardial infarction ?Severe CP, persistent in ED. ?-Likely atypical chest pain versus musculoskeletal in nature.  Patient also noted with abnormal MRI of the C-spine however states no significant change with chronic neck pain and chronic right upper extremity pain ?CTA neg for dissection or PE ?-High-sensitivity troponin done was negative. ?-EKG with no ischemic changes noted. ?-Patient seen in consultation by cardiology and patient underwent a pharmacological stress test which was low risk per cardiology. ?-2D echo pending. ?-Patient with exquisite tenderness to palpation right anterior chest wall concern for possible musculoskeletal etiology. ?-Discontinue Lidoderm patch. ?-Placed on scheduled ibuprofen 600 mg 3 times daily x5 days.   ?-MRI of the C-spine done status post ACDF C4-C7, C3-C4 moderate to severe spinal canal stenosis and mild bilateral neural foraminal narrowing, C2-C3 and C7-T1 mild right neuroforaminal narrowing. ?-Case discussed with neurosurgery over the phone who feel no neurosurgical intervention needed at this time and may follow-up in the outpatient setting. ?-Continue current pain management. ?-Supportive care. ? ?Hypokalemia ?- K-Dur 40 mEq p.o. x1. ?-Repeat labs in the AM. ? ?Seizure disorder ([place]) ?- Continue home regimen Lamictal. ?-Outpatient follow-up. ? ? ? ? ?  ? ? ?DVT prophylaxis: Lovenox ?Code Status: Full ?Family Communication: Updated patient.  No family at bedside. ?Disposition: Home when clinically improved, cleared by cardiology. ? ?Status is:  Observation ?The patient remains OBS appropriate and will d/c before 2 midnights. ?  ?Consultants:  ?Cardiology: Dr.Tolia 06/19/2021 ?Neurosurgery pending ? ?Procedures:  ?2D echo pending 06/19/2021 ?CT angiogram chest 06/18/2021  ?Chest x-ray 06/18/2021 ?MRI C-spine 06/19/2021 ?Pharmacological stress test 06/19/2021 ? ? ? ? ?Antimicrobials:  ?None ? ? ?Subjective: ?Patient laying in bed sleeping easily arousable.  Just returned from nuclear medicine stress test.  Denies any shortness of breath.  Still with right-sided chest pain exquisitely tender to palpation.  Denies any change in chronic neck pain.  Denies any change with chronic right upper extremity numbness. ? ?Objective: ?Vitals:  ? 06/19/21 0934 06/19/21 8099 06/19/21 1139 06/19/21 1526  ?BP: (!) 80/48 (!) 102/56 139/89 (!) 163/95  ?Pulse:   76 74  ?Resp:   12 16  ?Temp:   98.3 ?F (36.8 ?C) 98.5 ?F (36.9 ?C)  ?TempSrc:   Oral Oral  ?SpO2:   99% 98%  ?Weight:      ?Height:      ? ? ?Intake/Output Summary (Last 24 hours) at 06/19/2021 1909 ?Last data filed at 06/18/2021 2208 ?Gross per 24 hour  ?Intake 360 ml  ?Output --  ?Net 360 ml  ? ?Filed Weights  ? 06/18/21 1425 06/18/21 2149  ?Weight: 69.9 kg 69.4 kg  ? ? ?Examination: ? ?General exam: Appears calm and comfortable  ?Respiratory system: Clear to auscultation.  No wheezes, no crackles, no rhonchi.  Respiratory effort normal. ?Cardiovascular system: S1 & S2 heard, RRR. No JVD, murmurs, rubs, gallops or clicks. No pedal edema.  Right upper chest wall exquisitely tender to palpation. ?Gastrointestinal system: Abdomen is nondistended, soft and nontender. No organomegaly or masses felt. Normal bowel sounds heard. ?  Central nervous system: Alert and oriented. No focal neurological deficits. ?Extremities: Symmetric 5 x 5 power. ?Skin: No rashes, lesions or ulcers ?Psychiatry: Judgement and insight appear normal. Mood & affect appropriate.  ? ? ? ?Data Reviewed:  ? ?CBC: ?Recent Labs  ?Lab 06/18/21 ?1437  ?WBC 7.8  ?HGB  13.5  ?HCT 41.0  ?MCV 78.8*  ?PLT 233  ? ? ?Basic Metabolic Panel: ?Recent Labs  ?Lab 06/18/21 ?1437 06/19/21 ?0034  ?NA 141 136  ?K 3.5 3.2*  ?CL 109 105  ?CO2 22 24  ?GLUCOSE 101* 108*  ?BUN 9 10  ?CREATININE 0.72 0.65  ?CALCIUM 9.5 9.0  ?MG  --  2.0  ? ? ?GFR: ?Estimated Creatinine Clearance: 75.7 mL/min (by C-G formula based on SCr of 0.65 mg/dL). ? ?Liver Function Tests: ?No results for input(s): AST, ALT, ALKPHOS, BILITOT, PROT, ALBUMIN in the last 168 hours. ? ?CBG: ?No results for input(s): GLUCAP in the last 168 hours. ? ? ?Recent Results (from the past 240 hour(s))  ?Resp Panel by RT-PCR (Flu A&B, Covid) Nasopharyngeal Swab     Status: None  ? Collection Time: 06/18/21  2:40 PM  ? Specimen: Nasopharyngeal Swab; Nasopharyngeal(NP) swabs in vial transport medium  ?Result Value Ref Range Status  ? SARS Coronavirus 2 by RT PCR NEGATIVE NEGATIVE Final  ?  Comment: (NOTE) ?SARS-CoV-2 target nucleic acids are NOT DETECTED. ? ?The SARS-CoV-2 RNA is generally detectable in upper respiratory ?specimens during the acute phase of infection. The lowest ?concentration of SARS-CoV-2 viral copies this assay can detect is ?138 copies/mL. A negative result does not preclude SARS-Cov-2 ?infection and should not be used as the sole basis for treatment or ?other patient management decisions. A negative result may occur with  ?improper specimen collection/handling, submission of specimen other ?than nasopharyngeal swab, presence of viral mutation(s) within the ?areas targeted by this assay, and inadequate number of viral ?copies(<138 copies/mL). A negative result must be combined with ?clinical observations, patient history, and epidemiological ?information. The expected result is Negative. ? ?Fact Sheet for Patients:  ?EntrepreneurPulse.com.au ? ?Fact Sheet for Healthcare Providers:  ?IncredibleEmployment.be ? ?This test is no t yet approved or cleared by the [place] FDA and  ?has been  authorized for detection and/or diagnosis of SARS-CoV-2 by ?FDA under an Emergency Use Authorization (EUA). This EUA will remain  ?in effect (meaning this test can be used) for the duration of the ?COVID-19 declaration under Section 564(b)(1) of the Act, 21 ?U.S.C.section 360bbb-3(b)(1), unless the authorization is terminated  ?or revoked sooner.  ? ? ?  ? Influenza A by PCR NEGATIVE NEGATIVE Final  ? Influenza B by PCR NEGATIVE NEGATIVE Final  ?  Comment: (NOTE) ?The Xpert Xpress SARS-CoV-2/FLU/RSV plus assay is intended as an aid ?in the diagnosis of influenza from Nasopharyngeal swab specimens and ?should not be used as a sole basis for treatment. Nasal washings and ?aspirates are unacceptable for Xpert Xpress SARS-CoV-2/FLU/RSV ?testing. ? ?Fact Sheet for Patients: ?EntrepreneurPulse.com.au ? ?Fact Sheet for Healthcare Providers: ?IncredibleEmployment.be ? ?This test is not yet approved or cleared by the [place] FDA and ?has been authorized for detection and/or diagnosis of SARS-CoV-2 by ?FDA under an Emergency Use Authorization (EUA). This EUA will remain ?in effect (meaning this test can be used) for the duration of the ?COVID-19 declaration under Section 564(b)(1) of the Act, 21 U.S.C. ?section 360bbb-3(b)(1), unless the authorization is terminated or ?revoked. ? ?Performed at [facility], [place] [address], [place], [place] ?[address] ?  ?  MRSA Next Gen by PCR, Nasal     Status: None  ? Collection Time: 06/18/21  9:49 PM  ? Specimen: Nasal Mucosa; Nasal Swab  ?Result Value Ref Range Status  ? MRSA by PCR Next Gen NOT DETECTED NOT DETECTED Final  ?  Comment: (NOTE) ?The GeneXpert MRSA Assay (FDA approved for NASAL specimens only), ?is one component of a comprehensive MRSA colonization surveillance ?program. It is not intended to diagnose MRSA infection nor to guide ?or monitor treatment for MRSA infections. ?Test performance is not FDA approved in patients less than  2 years ?old. ?Performed at [facility], [place] [address], [place], [place] ?[address] ?  ?  ? ? ? ? ? ?Radiology Studies: ?DG Chest 2 View ? ?Result Date: 06/18/2021 ?CLINICAL DATA:  Short of breath and c

## 2021-06-19 NOTE — Plan of Care (Signed)
  Problem: Pain Managment: Goal: General experience of comfort will improve Outcome: Not Progressing   Problem: Education: Goal: Knowledge of General Education information will improve Description: Including pain rating scale, medication(s)/side effects and non-pharmacologic comfort measures Outcome: Progressing   Problem: Health Behavior/Discharge Planning: Goal: Ability to manage health-related needs will improve Outcome: Progressing   Problem: Clinical Measurements: Goal: Ability to maintain clinical measurements within normal limits will improve Outcome: Progressing Goal: Will remain free from infection Outcome: Progressing Goal: Diagnostic test results will improve Outcome: Progressing Goal: Respiratory complications will improve Outcome: Progressing Goal: Cardiovascular complication will be avoided Outcome: Progressing   Problem: Activity: Goal: Risk for activity intolerance will decrease Outcome: Progressing   Problem: Nutrition: Goal: Adequate nutrition will be maintained Outcome: Progressing   Problem: Coping: Goal: Level of anxiety will decrease Outcome: Progressing   Problem: Elimination: Goal: Will not experience complications related to bowel motility Outcome: Progressing Goal: Will not experience complications related to urinary retention Outcome: Progressing   Problem: Safety: Goal: Ability to remain free from injury will improve Outcome: Progressing   Problem: Skin Integrity: Goal: Risk for impaired skin integrity will decrease Outcome: Progressing   

## 2021-06-19 NOTE — Progress Notes (Signed)
  Echocardiogram 2D Echocardiogram has been performed.  Heather Figueroa M 07/14/2021, 3:52 PM

## 2021-06-19 NOTE — Consult Note (Signed)
? ?Providing Compassionate, Quality Care - Together ? ? ?Reason for Consult: Right-sided chest pain ?Referring Physician: Dr. Grandville Silos ? ?Heather Figueroa is an 51 y.o. female.  ?HPI: Heather Figueroa is a pleasant 51 year old female with a history of seizure disorder, anemia, anxiety, arthritis, TIA, and C4-C7 ACDF by Dr. Arnoldo Morale. She presented to the [facility] ED on 06/18/2021 with a complaint of severe pain in her right chest. She reports that she was rolling over in bed when the pain started. She became short of breath. She did not take anything for the pain before coming to the ED. She tells me she has chronic midline neck pain that was present even before she had neck surgery. She does not feel like this pain is related. Movement of her right arm and taking a deep breath aggravate the symptoms. She reports numbness in bilateral hands that has been present for a while. She feels weak in her right arm and hand due to the pain. She denies gait instability, pain in her arms, bowel or bladder dysfunction, or recent falls. A cervical MRI was performed, showing some degenerative changes. Neurosurgery was consulted for further evaluation and recommendations. ? ?Past Medical History:  ?Diagnosis Date  ? Anemia   ? Anxiety   ? Arthritis   ? Depression   ? Headache   ? Neuromuscular disorder ([place])   ? Seizures ([place])   ? pt has had 2 seizures in July 2018  ? Stroke [facility])   ? possible TIA in July 2018  ? ? ?Past Surgical History:  ?Procedure Laterality Date  ? ABDOMINAL HYSTERECTOMY    ? 2002  ? ANTERIOR CERVICAL DECOMP/DISCECTOMY FUSION N/A 01/26/2017  ? Procedure: ANTERIOR CERVICAL DECOMPRESSION/DISCECTOMY FUSION, INTERBODY PROSTHESIS, PLATE/SCREWS, POSSIBLE CORPECTOMY CERVICAL FOUR- CERVICAL FIVE, CERVICAL FIVE- CERVICAL SIX, CERVICAL SIX- CERVICAL SEVEN;  Surgeon: Newman Pies, MD;  Location: [place];  Service: Neurosurgery;  Laterality: N/A;  ANTERIOR CERVICAL DECOMPRESSION/DISCECTOMY FUSION, INTERBODY PROSTHESIS,  PLATE/SCREWS, POSSIBLE CORPE  ? APPENDECTOMY    ? 2010  ? OVARIAN CYST REMOVAL    ? TUBAL LIGATION    ? 1998  ? ? ?Family History  ?Problem Relation Age of Onset  ? Colon cancer Mother   ? Pulmonary embolism Sister   ? Bladder Cancer Maternal Grandmother   ? ? ?Social History:  reports that she has been smoking cigarettes. She has been smoking an average of 1 pack per day. She has never used smokeless tobacco. She reports that she does not drink alcohol and does not use drugs. ? ?Allergies:  ?Allergies  ?Allergen Reactions  ? Pentazocine Anaphylaxis  ? Percocet [Oxycodone-Acetaminophen] Other (See Comments)  ?  Swelling generalized  ? Tylenol [Acetaminophen] Swelling  ? ? ?Medications: I have reviewed the patient's current medications. ? ?Results for orders placed or performed during the hospital encounter of 06/18/21 (from the past 48 hour(s))  ?Basic metabolic panel     Status: Abnormal  ? Collection Time: 06/18/21  2:37 PM  ?Result Value Ref Range  ? Sodium 141 135 - 145 mmol/L  ? Potassium 3.5 3.5 - 5.1 mmol/L  ? Chloride 109 98 - 111 mmol/L  ? CO2 22 22 - 32 mmol/L  ? Glucose, Bld 101 (H) 70 - 99 mg/dL  ?  Comment: Glucose reference range applies only to samples taken after fasting for at least 8 hours.  ? BUN 9 6 - 20 mg/dL  ? Creatinine, Ser 0.72 0.44 - 1.00 mg/dL  ? Calcium 9.5 8.9 -  10.3 mg/dL  ? GFR, Estimated >60 >60 mL/min  ?  Comment: (NOTE) ?Calculated using the CKD-EPI Creatinine Equation (2021) ?  ? Anion gap 10 5 - 15  ?  Comment: Performed at [facility], [place] [address] [place], [place] [address]  ?CBC     Status: Abnormal  ? Collection Time: 06/18/21  2:37 PM  ?Result Value Ref Range  ? WBC 7.8 4.0 - 10.5 K/uL  ? RBC 5.20 (H) 3.87 - 5.11 MIL/uL  ? Hemoglobin 13.5 12.0 - 15.0 g/dL  ? HCT 41.0 36.0 - 46.0 %  ? MCV 78.8 (L) 80.0 - 100.0 fL  ? MCH 26.0 26.0 - 34.0 pg  ? MCHC 32.9 30.0 - 36.0 g/dL  ? RDW 15.3 11.5 - 15.5 %  ? Platelets 233 150 - 400 K/uL  ? nRBC 0.0 0.0 - 0.2 %  ?  Comment:  Performed at [facility], [place] [address] [place],  [address]  ?Troponin I (High Sensitivity)     Status: None  ? Collection Time: 06/18/21  2:37 PM  ?Result Value Ref Range  ? Troponin I (High Sensitivity) 4 <18 ng/L  ?  Comment: (NOTE) ?Elevated high sensitivity troponin I (hsTnI) values and significant  ?changes across serial measurements may suggest ACS but many other  ?chronic and acute conditions are known to elevate hsTnI results.  ?Refer to the "Links" section for chest pain algorithms and additional  ?guidance. ?Performed at [facility], [place] [address] [place], [place] ?[address] ?  ?Resp Panel by RT-PCR (Flu A&B, Covid) Nasopharyngeal Swab     Status: None  ? Collection Time: 06/18/21  2:40 PM  ? Specimen: Nasopharyngeal Swab; Nasopharyngeal(NP) swabs in vial transport medium  ?Result Value Ref Range  ? SARS Coronavirus 2 by RT PCR NEGATIVE NEGATIVE  ?  Comment: (NOTE) ?SARS-CoV-2 target nucleic acids are NOT DETECTED. ? ?The SARS-CoV-2 RNA is generally detectable in upper respiratory ?specimens during the acute phase of infection. The lowest ?concentration of SARS-CoV-2 viral copies this assay can detect is ?138 copies/mL. A negative result does not preclude SARS-Cov-2 ?infection and should not be used as the sole basis for treatment or ?other patient management decisions. A negative result may occur with  ?improper specimen collection/handling, submission of specimen other ?than nasopharyngeal swab, presence of viral mutation(s) within the ?areas targeted by this assay, and inadequate number of viral ?copies(<138 copies/mL). A negative result must be combined with ?clinical observations, patient history, and epidemiological ?information. The expected result is Negative. ? ?Fact Sheet for Patients:  ?EntrepreneurPulse.com.au ? ?Fact Sheet for Healthcare Providers:  ?IncredibleEmployment.be ? ?This test is no t yet approved or cleared by the [place] FDA and  ?has been authorized for detection and/or diagnosis of SARS-CoV-2 by ?FDA under an Emergency Use Authorization (EUA). This EUA will remain  ?in effect (meaning this test can be used) for the duration of the ?COVID-19 declaration under Section 564(b)(1) of the Act, 21 ?U.S.C.section 360bbb-3(b)(1), unless the authorization is terminated  ?or revoked sooner.  ? ? ?  ? Influenza A by PCR NEGATIVE NEGATIVE  ? Influenza B by PCR NEGATIVE NEGATIVE  ?  Comment: (NOTE) ?The Xpert Xpress SARS-CoV-2/FLU/RSV plus assay is intended as an aid ?in the diagnosis of influenza from Nasopharyngeal swab specimens and ?should not be used as a sole basis for treatment. Nasal washings and ?aspirates are unacceptable for Xpert Xpress SARS-CoV-2/FLU/RSV ?testing. ? ?Fact Sheet for Patients: ?EntrepreneurPulse.com.au ? ?Fact Sheet  for Healthcare Providers: ?IncredibleEmployment.be ? ?This test is not yet approved or cleared by the [place] FDA and ?has been authorized for detection and/or diagnosis of SARS-CoV-2 by ?FDA under an Emergency Use Authorization (EUA). This EUA will remain ?in effect (meaning this test can be used) for the duration of the ?COVID-19 declaration under Section 564(b)(1) of the Act, 21 U.S.C. ?section 360bbb-3(b)(1), unless the authorization is terminated or ?revoked. ? ?Performed at [facility], [place] [address], [place], [place] ?[address] ?  ?I-Stat beta hCG blood, ED     Status: None  ? Collection Time: 06/18/21  2:49 PM  ?Result Value Ref Range  ? I-stat hCG, quantitative <5.0 <5 mIU/mL  ? Comment 3          ?  Comment:   GEST. AGE      CONC.  (mIU/mL) ?  <=1 WEEK        5 - 50 ?    2 WEEKS       50 - 500 ?    3 WEEKS       100 - 10,000 ?    4 WEEKS     1,000 - 30,000 ?       ?FEMALE AND NON-PREGNANT FEMALE: ?    LESS THAN 5 mIU/mL ?  ?Troponin I (High Sensitivity)     Status: None  ? Collection Time: 06/18/21  4:35 PM  ?Result Value Ref Range  ?  Troponin I (High Sensitivity) 4 <18 ng/L  ?  Comment: (NOTE) ?Elevated high sensitivity troponin I (hsTnI) values and significant  ?changes across serial measurements may suggest ACS but many other  ?chronic and acute

## 2021-06-19 NOTE — Progress Notes (Signed)
Pt to [facility] for stress test.  ?

## 2021-06-19 NOTE — Assessment & Plan Note (Addendum)
Repleted. °

## 2021-06-20 DIAGNOSIS — E876 Hypokalemia: Secondary | ICD-10-CM | POA: Diagnosis not present

## 2021-06-20 DIAGNOSIS — G40909 Epilepsy, unspecified, not intractable, without status epilepticus: Secondary | ICD-10-CM | POA: Diagnosis not present

## 2021-06-20 DIAGNOSIS — Q211 Atrial septal defect, unspecified: Secondary | ICD-10-CM

## 2021-06-20 DIAGNOSIS — R079 Chest pain, unspecified: Secondary | ICD-10-CM | POA: Diagnosis not present

## 2021-06-20 DIAGNOSIS — Z720 Tobacco use: Secondary | ICD-10-CM

## 2021-06-20 DIAGNOSIS — Q2112 Patent foramen ovale: Secondary | ICD-10-CM | POA: Insufficient documentation

## 2021-06-20 DIAGNOSIS — Z87891 Personal history of nicotine dependence: Secondary | ICD-10-CM | POA: Diagnosis present

## 2021-06-20 LAB — BASIC METABOLIC PANEL
Anion gap: 5 (ref 5–15)
BUN: 16 mg/dL (ref 6–20)
CO2: 25 mmol/L (ref 22–32)
Calcium: 9.5 mg/dL (ref 8.9–10.3)
Chloride: 108 mmol/L (ref 98–111)
Creatinine, Ser: 0.77 mg/dL (ref 0.44–1.00)
GFR, Estimated: >60 mL/min (ref >60)
Glucose, Bld: 199 mg/dL — ABNORMAL HIGH (ref 70–99)
Potassium: 3.6 mmol/L (ref 3.5–5.1)
Sodium: 138 mmol/L (ref 135–145)

## 2021-06-20 LAB — CBC WITH DIFFERENTIAL/PLATELET
Abs Immature Granulocytes: 0.03 10*3/uL (ref 0.00–0.07)
Basophils Absolute: 0 10*3/uL (ref 0.0–0.1)
Basophils Relative: 0 %
Eosinophils Absolute: 0 10*3/uL (ref 0.0–0.5)
Eosinophils Relative: 0 %
HCT: 35.6 % — ABNORMAL LOW (ref 36.0–46.0)
Hemoglobin: 12 g/dL (ref 12.0–15.0)
Immature Granulocytes: 1 %
Lymphocytes Relative: 25 %
Lymphs Abs: 1.6 10*3/uL (ref 0.7–4.0)
MCH: 26.3 pg (ref 26.0–34.0)
MCHC: 33.7 g/dL (ref 30.0–36.0)
MCV: 77.9 fL — ABNORMAL LOW (ref 80.0–100.0)
Monocytes Absolute: 0.1 10*3/uL (ref 0.1–1.0)
Monocytes Relative: 2 %
Neutro Abs: 4.7 10*3/uL (ref 1.7–7.7)
Neutrophils Relative %: 72 %
Platelets: 188 10*3/uL (ref 150–400)
RBC: 4.57 MIL/uL (ref 3.87–5.11)
RDW: 15.2 % (ref 11.5–15.5)
WBC: 6.5 10*3/uL (ref 4.0–10.5)
nRBC: 0 % (ref 0.0–0.2)

## 2021-06-20 LAB — GLUCOSE, CAPILLARY: Glucose-Capillary: 157 mg/dL — ABNORMAL HIGH (ref 70–99)

## 2021-06-20 MED ORDER — POTASSIUM CHLORIDE CRYS ER 20 MEQ PO TBCR
40.0000 meq | EXTENDED_RELEASE_TABLET | Freq: Once | ORAL | Status: AC
Start: 2021-06-20 — End: 2021-06-20
  Administered 2021-06-20: 40 meq via ORAL
  Filled 2021-06-20: qty 2

## 2021-06-20 MED ORDER — INSULIN ASPART 100 UNIT/ML IJ SOLN
0.0000 [IU] | Freq: Three times a day (TID) | INTRAMUSCULAR | Status: DC
  Administered 2021-06-20: 3 [IU] via SUBCUTANEOUS

## 2021-06-20 NOTE — Assessment & Plan Note (Signed)
-   Tobacco cessation stressed to patient. ?

## 2021-06-20 NOTE — Progress Notes (Signed)
Inpatient Diabetes Program Recommendations ? ?AACE/ADA: New Consensus Statement on Inpatient Glycemic Control  ? ?Target Ranges:  Prepandial:   less than 140 mg/dL ?     Peak postprandial:   less than 180 mg/dL (1-2 hours) ?     Critically ill patients:  140 - 180 mg/dL  ? ? Latest Reference Range & Units 06/20/21 11:22  ?Glucose-Capillary 70 - 99 mg/dL 157 (H)  ? ? Latest Reference Range & Units 06/18/21 14:37 06/19/21 00:34 06/20/21 00:31  ?Glucose 70 - 99 mg/dL 101 (H) 108 (H) 199 (H)  ?Hemoglobin A1C 4.8 - 5.6 %  6.0 (H)   ? ? ?Review of Glycemic Control ? ?Diabetes history: No ?Outpatient Diabetes medications: NA ?Current orders for Inpatient glycemic control: Novolog 0-15 units TID with meals; Decadron 4 mg Q6H ? ?Inpatient Diabetes Program Recommendations:   ? ?Insulin: Agree with current insulin orders while ordered steroids. ? ?HbgA1C:  A1C 6% on 06/19/21 indicating an average glucose of 126 mg/dl over the past 2-3 months. Would recommend patient follow up with PCP regarding A1C. ? ?NOTE: Noted consult for Diabetes Coordinator. Diabetes Coordinator is not on campus over the weekend but available by pager from 8am to 5pm for questions or concerns. Chart reviewed. Patient admitted on 06/18/21 with chest pain. Patient has NO hx of DM. Patient is ordered Decadron 4 mg Q6H for muscle inflammation. Patient ordered Novolog correction scale today and glucose 157 mg/dl at 11:22 today. Current A1C 6% on 06/19/21.  ? ?Thanks, ?Heather Alderman, RN, MSN, CDE ?Diabetes Coordinator ?Inpatient Diabetes Program ?[phone] (Team Pager from 8am to 5pm) ? ? ?

## 2021-06-20 NOTE — Assessment & Plan Note (Addendum)
-   Patient noted on 2D echo to have an incidental finding of possible ASD. ?-Per cardiology no acute interventions warranted at this time and likely not etiology of patient's chest pain. ?-Due to prior history of TIA/migraine outpatient work-up appropriate per cardiology who have referred patient to an adult congenital heart disease specialist for further evaluation and management. ?

## 2021-06-20 NOTE — Discharge Summary (Signed)
Physician Discharge Summary  ?ZARYIAH BARZ TFT:[id number] DOB: 09/24/1970 DOA: 06/18/2021 ? ?PCP: Marliss Coots, NP ? ?Admit date: 06/18/2021 ?Discharge date: 06/20/2021 ? ?Time spent: 60 minutes ? ?Recommendations for Outpatient Follow-up:  ?Follow-up with Placey, Audrea Muscat, NP in 1 week.  On follow-up patient is chest wall pain will need to be reassessed on follow-up.  Patient will need a basic metabolic profile and magnesium level checked to follow-up on electrolytes and renal function. ?Follow-up with Dr. Terri Skains, cardiology 07/17/2021. ?Follow-up with Dr. Newman Pies, neurosurgery in 3 weeks. ?Follow-up with Dr. Jeralyn Bennett, pediatric cardiology for evaluation of ASD ? ? ?Discharge Diagnoses:  ?Principal Problem: ?  Chest pain, rule out acute myocardial infarction ?Active Problems: ?  Seizure disorder ([place]) ?  Hypokalemia ?  ASD (atrial septal defect): Possible ?  Tobacco abuse ? ? ?Discharge Condition: Stable and improved ? ?Diet recommendation: Heart healthy ? ?Filed Weights  ? 06/18/21 1425 06/18/21 2149  ?Weight: 69.9 kg 69.4 kg  ? ? ?History of present illness:  ?HPI per Dr. Alcario Drought ? NATLIE ASFOUR is a 51 y.o. female with medical history significant of seizure disorder. ?  ?Pt presented to ED with c/o CP and tightness, SOB. ?  ?Symptoms onset last night suddenly.  Worsened this AM. ?  ? Patient states that she was rolling over and felt an extremely sharp pain in her right chest.  Patient states she immediately became short of breath.  Patient states she is also felt like she may have had cold sweats after the pain began.  Patient rates the pain as a 10 out of 10.  States that the pain radiates into the right side of her neck.  Patient tried nothing at home and nothing seem to make the pain better.  She does state that certain movements seem to make the pain worse. ?Hospital Course:  ? ?Assessment and Plan: ?* Chest pain, rule out acute myocardial infarction ?Severe CP, persistent in ED. ?-Likely  atypical chest pain versus musculoskeletal versus costochondritis in nature.  Patient also noted with abnormal MRI of the C-spine however states no significant change with chronic neck pain and chronic right upper extremity pain ?CTA neg for dissection or PE ?-High-sensitivity troponin done was negative. ?-EKG with no ischemic changes noted. ?-Patient seen in consultation by cardiology and patient underwent a pharmacological stress test which was low risk per cardiology. ?-2D echo with EF 60 to 65%,NWMA, evidence of atrial level shunting detected by color-flow Doppler to suggest possible ASD. ?-Patient with exquisite tenderness to palpation right anterior chest wall initially on presentation with concern for possible musculoskeletal etiology. ?-Patient initially had Lidoderm patch placed which was subsequently discontinued. ?-Sed rate,  CRP within normal limits. ?-Patient with no EKG changes or 2D echo findings suggestive of pericardial effusion or pericarditis. ?-Patient initially placed on Lidoderm patch with no improvement subsequently discontinued. ?-Patient also placed on scheduled ibuprofen 600 mg 3 times daily.  ?-MRI of the C-spine done status post ACDF C4-C7, C3-C4 moderate to severe spinal canal stenosis and mild bilateral neural foraminal narrowing, C2-C3 and C7-T1 mild right neuroforaminal narrowing. ?-Patient seen in consultation by neurosurgery who reviewed MRI films, assessed patient and felt patient likely had costochondritis and subsequently placed patient on IV Decadron and prescribed a Medrol Dosepak sent directly to her pharmacy on discharge . ?-Patient improved clinically however still had ongoing anterior chest wall pain and will be discharged home in stable and improved condition on Medrol Dosepak.   ?-Outpatient follow-up with  PCP.  ? ?Tobacco abuse ?- Tobacco cessation stressed to patient. ? ?ASD (atrial septal defect): Possible ?- Patient noted on 2D echo to have an incidental finding of  possible ASD. ?-Per cardiology no acute interventions warranted at this time and likely not etiology of patient's chest pain. ?-Due to prior history of TIA/migraine outpatient work-up appropriate per cardiology who have referred patient to an adult congenital heart disease specialist for further evaluation and management. ? ?Hypokalemia ?- Repleted.  ? ?Seizure disorder ([place]) ?- Patient maintained on home regimen Lamictal.   ?-Outpatient follow-up. ? ? ? ? ?  ? ?Procedures: ?2D echo 06/19/2021 ?CT angiogram chest 06/18/2021  ?Chest x-ray 06/18/2021 ?MRI C-spine 06/19/2021 ?Pharmacological stress test 06/19/2021 ? ?Consultations: ?Cardiology: Dr. Geanie Logan 06/19/2021 ?Neurosurgery: Viona Gilmore, NP 06/19/2021 ? ?Discharge Exam: ?Vitals:  ? 06/20/21 1158 06/20/21 1208  ?BP: (!) 168/100 (!) 158/93  ?Pulse:  87  ?Resp:  17  ?Temp:    ?SpO2:  98%  ? ? ?General: NAD ?Cardiovascular: Regular rate rhythm no murmurs rubs or gallops.  No JVD.  No lower extremity edema.  Right upper chest wall with decreased tenderness to palpation. ?Respiratory: Clear to auscultation bilaterally.  No wheezes, no crackles, no rhonchi.  Fair air movement.  Speaking in full sentences. ? ?Discharge Instructions ? ? ?Discharge Instructions   ? ? Diet - low sodium heart healthy   Complete by: As directed ?  ? Increase activity slowly   Complete by: As directed ?  ? ?  ? ?Allergies as of 06/20/2021   ? ?   Reactions  ? Pentazocine Anaphylaxis  ? Percocet [oxycodone-acetaminophen] Other (See Comments)  ? Swelling generalized  ? Tylenol [acetaminophen] Swelling  ? ?  ? ?  ?Medication List  ?  ? ?TAKE these medications   ? ?lamoTRIgine 100 MG tablet ?Commonly known as: LAMICTAL ?Take 100 mg by mouth daily. ?  ?methylPREDNISolone 4 MG Tbpk tablet ?Commonly known as: MEDROL DOSEPAK ?Follow instructions on packet insert. ?  ?ondansetron 4 MG disintegrating tablet ?Commonly known as: ZOFRAN-ODT ?Take 4 mg by mouth every 8 (eight) hours as needed. ?  ?pantoprazole 20  MG tablet ?Commonly known as: PROTONIX ?Take 20 mg by mouth every morning. ?  ?prazosin 5 MG capsule ?Commonly known as: MINIPRESS ?Take 5 mg by mouth at bedtime. ?  ?QUEtiapine 300 MG tablet ?Commonly known as: SEROQUEL ?Take 300 mg by mouth at bedtime. ?  ?rizatriptan 10 MG tablet ?Commonly known as: MAXALT ?Take 10 mg by mouth as needed for migraine. ?  ? ?  ? ?Allergies  ?Allergen Reactions  ? Pentazocine Anaphylaxis  ? Percocet [Oxycodone-Acetaminophen] Other (See Comments)  ?  Swelling generalized  ? Tylenol [Acetaminophen] Swelling  ? ? Follow-up Information   ? ? Newman Pies, MD. Schedule an appointment as soon as possible for a visit in 3 week(s).   ?Specialty: Neurosurgery ?Contact information: ?[address] ?[address] ?[place] [address] ?[phone] ? ? ?  ?  ? ? Tolia, Sunit, DO Follow up on 07/17/2021.   ?Specialties: Cardiology, Vascular Surgery ?Why: 10:30am  ?s/p hospitalization ?Contact information: ?[address] ?[place] [address] ?[phone] ? ? ?  ?  ? ? Jonah Blue, MD. Call.   ?Specialty: Pediatric Cardiology ?Why: History of TIA and incidental ASD on 2D echo. ?Contact information: ?[address] ?[address] ?[place] [address] ?[phone] ? ? ?  ?  ? ? Placey, Audrea Muscat, NP. Schedule an appointment as soon  as possible for a visit in 1 week(s).   ?Contact information: ?[place] [address] ?[phone] ? ? ?  ?  ? ?  ?  ? ?  ? ? ? ?The results of significant diagnostics from this hospitalization (including imaging, microbiology, ancillary and laboratory) are listed below for reference.   ? ?Significant Diagnostic Studies: ?DG Chest 2 View ? ?Result Date: 06/18/2021 ?CLINICAL DATA:  Short of breath and chest tightness EXAM: CHEST - 2 VIEW COMPARISON:  08/14/2018 FINDINGS: Cervical spine fixation.  Minimal pectus excavatum deformity. Midline trachea.  Normal heart size and mediastinal contours. Sharp costophrenic angles.  No  pneumothorax.  Clear lungs. Minimal S shaped thoracolumbar spine curvature. IMPRESSION: No active cardiopulmonary disease. Electronically Signed   By: Abigail Miyamoto M.D.   On: 06/18/2021 15:02  ? ?MR Cer

## 2021-06-20 NOTE — Discharge Instructions (Signed)
Visit [email or web address] for more information on the DASH diet and a low sodium lifestyle.  ?

## 2021-06-20 NOTE — Plan of Care (Signed)
?  Problem: Clinical Measurements: ?Goal: Ability to maintain clinical measurements within normal limits will improve ?Outcome: Not Progressing ?  ?Problem: Nutrition: ?Goal: Adequate nutrition will be maintained ?Outcome: Not Progressing ?  ?Problem: Coping: ?Goal: Level of anxiety will decrease ?Outcome: Not Progressing ?  ?Problem: Pain Managment: ?Goal: General experience of comfort will improve ?Outcome: Not Progressing ?  ?

## 2021-06-20 NOTE — Progress Notes (Signed)
Progress Note ? ?Patient Name: Heather Figueroa ?Date of Encounter: 06/20/2021 ? ?Attending physician: Eugenie Filler, MD ?Primary care provider: Marliss Coots, NP ? ?Subjective: ?Heather Figueroa is a 51 y.o. female who was seen and examined at bedside  ?Precordial pain present but improving ?No anginal discomfort or heart failure symptoms.  ?Case discussed and reviewed with her nurse. ? ?Objective: ?Vital Signs in the last 24 hours: ?Temp:  [97.8 ?F (36.6 ?C)-98.5 ?F (36.9 ?C)] 97.8 ?F (36.6 ?C) (04/29 1119) ?Pulse Rate:  [74-104] 104 (04/29 1119) ?Resp:  [12-18] 18 (04/29 1119) ?BP: (127-176)/(78-107) 168/100 (04/29 1158) ?SpO2:  [95 %-99 %] 99 % (04/29 1119) ? ?Intake/Output: ?No intake or output data in the 24 hours ending 06/20/21 1203  ?Net IO Since Admission: 360 mL [06/20/21 1203] ? ?Weights:  ?Filed Weights  ? 06/18/21 1425 06/18/21 2149  ?Weight: 69.9 kg 69.4 kg  ? ? ?Telemetry: Personally reviewed. NSR w/o significant dysrhythmia.  ? ?Physical examination: ?PHYSICAL EXAM: ? ?  06/20/2021  ? 11:58 AM 06/20/2021  ? 11:57 AM 06/20/2021  ? 11:19 AM  ?Vitals with BMI  ?Systolic 341 937 902  ?Diastolic 409 735 329  ?Pulse   104  ? ? ?CONSTITUTIONAL: Appears older than stated age, hemodynamically stable, no acute distress.  ?HEENT: [place]/AT, No scleral icterus, moist oral membranes, no JVP, no carotid bruit.  ?LUNGS: Clear to auscultation bilaterally.  No stridor. No wheezes. No rales.  ?CARDIOVASCULAR: Regular rate and rhythm, positive S1-S2, no murmurs rubs or gallops appreciated.  ?ABDOMINAL: Soft, nontender, nondistended, positive bowel sounds in all 4 quadrants, no apparent ascites.  ?EXTREMITIES: No pitting edema, warm to touch, +2 DP and PT pulses ?HEMATOLOGIC: No significant bruising ?NEUROLOGIC: Oriented to person, place, and time. Nonfocal. Normal muscle tone.  ?PSYCHIATRIC: Normal mood and affect. Normal behavior. Cooperative ? ?Lab Results: ?Hematology ?Recent Labs  ?Lab 06/18/21 ?1437  06/20/21 ?0031  ?WBC 7.8 6.5  ?RBC 5.20* 4.57  ?HGB 13.5 12.0  ?HCT 41.0 35.6*  ?MCV 78.8* 77.9*  ?MCH 26.0 26.3  ?MCHC 32.9 33.7  ?RDW 15.3 15.2  ?PLT 233 188  ? ? ?Chemistry ?Recent Labs  ?Lab 06/18/21 ?1437 06/19/21 ?0034 06/20/21 ?0031  ?NA 141 136 138  ?K 3.5 3.2* 3.6  ?CL 109 105 108  ?CO2 '22 24 25  '$ ?GLUCOSE 101* 108* 199*  ?BUN '9 10 16  '$ ?CREATININE 0.72 0.65 0.77  ?CALCIUM 9.5 9.0 9.5  ?GFRNONAA >60 >60 >60  ?ANIONGAP '10 7 5  '$ ?  ? ?Cardiac Enzymes: ?Cardiac Panel (last 3 results) ?Recent Labs  ?  06/18/21 ?1635 06/18/21 ?2209 06/19/21 ?0034  ?TROPONINIHS '4 5 5  '$ ? ? ?BNP (last 3 results) ?No results for input(s): BNP in the last 8760 hours. ? ?ProBNP (last 3 results) ?No results for input(s): PROBNP in the last 8760 hours. ? ? ?DDimer  ?Recent Labs  ?Lab 06/18/21 ?2209  ?DDIMER <0.27  ?  ? ?Hemoglobin A1c:  ?Lab Results  ?Component Value Date  ? HGBA1C 6.0 (H) 06/19/2021  ? MPG 125.5 06/19/2021  ? ? ?TSH  ?Recent Labs  ?  06/19/21 ?0034  ?TSH 1.428  ? ? ?Lipid Panel  ?   ?Component Value Date/Time  ? CHOL 160 06/19/2021 0034  ? TRIG 123 06/19/2021 0034  ? HDL 37 (L) 06/19/2021 0034  ? CHOLHDL 4.3 06/19/2021 0034  ? VLDL 25 06/19/2021 0034  ? [place] 98 06/19/2021 0034  ? LDLDIRECT 89.4 06/19/2021 0034  ? ? ?Imaging: ?DG Chest  2 View ? ?Result Date: 06/18/2021 ?CLINICAL DATA:  Short of breath and chest tightness EXAM: CHEST - 2 VIEW COMPARISON:  08/14/2018 FINDINGS: Cervical spine fixation.  Minimal pectus excavatum deformity. Midline trachea.  Normal heart size and mediastinal contours. Sharp costophrenic angles.  No pneumothorax.  Clear lungs. Minimal S shaped thoracolumbar spine curvature. IMPRESSION: No active cardiopulmonary disease. Electronically Signed   By: Abigail Miyamoto M.D.   On: 06/18/2021 15:02  ? ?MR Cervical Spine Wo Contrast ? ?Result Date: 06/19/2021 ?CLINICAL DATA:  Cervical radiculopathy EXAM: MRI CERVICAL SPINE WITHOUT CONTRAST TECHNIQUE: Multiplanar, multisequence MR imaging of the cervical  spine was performed. No intravenous contrast was administered. COMPARISON:  None available. Correlation is made with CT cervical spine 01/08/2010 and cervical spine radiographs 03/01/2017 FINDINGS: Alignment: Straightening of the normal cervical lordosis. No listhesis. Vertebrae: Status post ACDF C4-C7, with associated susceptibility artifact. No evidence of acute fracture or suspicious osseous lesion. Congenitally short pedicles, which narrow the AP diameter of the spinal canal. Cord: Bilateral, symmetric, circular foci of T2 hyperintense signal in the anterior horn cells of the spinal cord at C5 (series 9, images 20 3-25). Otherwise normal in signal. Normal in caliber with cord flattening C4-C6. Posterior Fossa, vertebral arteries, paraspinal tissues: Negative. Disc levels: C2-C3: Mild disc bulge. Right-greater-than-left facet arthropathy. No spinal canal stenosis. Mild right neural foraminal narrowing. C3-C4: Broad-based disc bulge. Facet and uncovertebral hypertrophy. Moderate to severe spinal canal stenosis. Mild bilateral neural foraminal narrowing. C4-C5: Status post ACDF. No spinal canal stenosis or neural foraminal narrowing. C5-C6: Status post ACDF, with midline osseous hypertrophy posterior to the C5 vertebral body, which indents and deforms the ventral cord (series 9, image 22). At the disc level, there is no spinal canal stenosis or neural foraminal narrowing. C6-C7: Status post ACDF. Smaller midline osseous hypertrophy posterior to the C6 vertebral body. No spinal canal stenosis or neural foraminal narrowing. C7-T1: Mild disc bulge. Uncovertebral and facet arthropathy. No spinal canal stenosis. Mild right neural foraminal narrowing. IMPRESSION: 1. Status post ACDF C4-C7, with no residual spinal canal stenosis or neural foraminal narrowing at these levels. 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 2. C3-C4 moderate to severe spinal canal stenosis and mild bilateral neural foraminal narrowing. 3. C2-C3 and C7-T1 mild right neural foraminal narrowing. Electronically Signed   By: Merilyn Baba M.D.   On: 06/19/2021 03:22  ? ?NM Myocar Multi W/Spect W/Wall Motion / EF ? ?Result Date: 06/19/2021 ?CLINICAL DATA:  Nonspecific chest pain EXAM: MYOCARDIAL IMAGING WITH SPECT (REST AND EXERCISE) GATED LEFT VENTRICULAR WALL MOTION STUDY LEFT VENTRICULAR EJECTION FRACTION TECHNIQUE: Standard myocardial SPECT imaging was performed after resting intravenous injection of 10 mCi Tc-67mtetrofosmin. Subsequently, exercise tolerance test was performed by the patient under the supervision of the Cardiology staff. At peak-stress, 30 mCi Tc-934metrofosminwas injected intravenously and standard myocardial SPECT imaging was performed. Quantitative gated imaging was also performed to evaluate left ventricular wall motion, and estimate left ventricular ejection fraction. COMPARISON:  None. FINDINGS: Perfusion: No decreased activity in the left ventricle on stress imaging to suggest reversible ischemia or infarction. Wall Motion: Normal left ventricular wall motion. No left ventricular dilation. Left Ventricular Ejection Fraction: 71 % End diastolic  volume 73 ml End systolic volume 21 ml IMPRESSION: 1. No reversible ischemia or infarction. 2. Normal left ventricular wall motion. 3. Left ventricular ejection fraction 71% 4. Non invasive risk stratification*: Low *2012 Appropriate Use Criteria for Coronary Revascularization Focused Update: J Am Coll Cardiol. 8832;54(9):826-415. http://content.airportbarriers.com.aspx?articleid=1201161 Electronically Signed   By: Kerby Moors M.D.   On: 06/19/2021 12:53   ? ?ECHOCARDIOGRAM COMPLETE ? ?Result Date: 06/19/2021 ?   ECHOCARDIOGRAM REPORT   Patient Name:   YAZMYN VALBUENA Date of Exam: 06/19/2021 Medical Rec #:  [id number]        Height:       61.0 in Accession #:    [id number]       [place]

## 2021-06-20 NOTE — Plan of Care (Signed)

## 2021-07-17 ENCOUNTER — Ambulatory Visit: Payer: Medicaid Other | Admitting: Cardiology

## 2021-07-17 ENCOUNTER — Encounter: Payer: Self-pay | Admitting: Cardiology

## 2021-07-17 VITALS — BP 148/103 | HR 88 | Resp 16 | Ht 61.0 in | Wt 152.0 lb

## 2021-07-17 DIAGNOSIS — I10 Essential (primary) hypertension: Secondary | ICD-10-CM

## 2021-07-17 DIAGNOSIS — R0609 Other forms of dyspnea: Secondary | ICD-10-CM

## 2021-07-17 DIAGNOSIS — Z720 Tobacco use: Secondary | ICD-10-CM

## 2021-07-17 DIAGNOSIS — R072 Precordial pain: Secondary | ICD-10-CM

## 2021-07-17 DIAGNOSIS — G40909 Epilepsy, unspecified, not intractable, without status epilepticus: Secondary | ICD-10-CM

## 2021-07-17 DIAGNOSIS — F17210 Nicotine dependence, cigarettes, uncomplicated: Secondary | ICD-10-CM

## 2021-07-17 DIAGNOSIS — G43809 Other migraine, not intractable, without status migrainosus: Secondary | ICD-10-CM

## 2021-07-17 DIAGNOSIS — Q211 Atrial septal defect, unspecified: Secondary | ICD-10-CM

## 2021-07-17 MED ORDER — METOPROLOL SUCCINATE ER 25 MG PO TB24: 25.0000 mg | ORAL_TABLET | Freq: Every day | ORAL | 0 refills | Status: AC

## 2021-07-17 MED ORDER — OLMESARTAN MEDOXOMIL-HCTZ 20-12.5 MG PO TABS
1.0000 | ORAL_TABLET | Freq: Every morning | ORAL | 0 refills | Status: DC
Start: 2021-07-17 — End: 2022-02-25

## 2021-07-17 NOTE — Progress Notes (Signed)
ID:  Heather Figueroa, DOB 1970/09/02, MRN [id number]  PCP:  Derinda Late, MD  Cardiologist:  Rex Kras, DO, [facility] (established care April 28th 2023)  Date: 07/17/21  Chief Complaint  Patient presents with   Chest Pain   Hospitalization Follow-up    HPI  Heather Figueroa is a 51 y.o. African-American female whose past medical history and cardiovascular risk factors include: Hypertension, prediabetic, history of TIA, cigarette smoker (1 pack/day), history of seizures, history of neuromuscular disorder.  Patient was seen in consult during her recent hospitalization and April 2023 for chest pain.  She underwent cardiovascular work-up including an echo and stress test and later discharged home.  Since discharge patient states that she has precordial discomfort intermittently, located substernally on the right side of the anterior chest wall, intensity 8 out of 10, tightness like sensation, worse with moving her torso left to right, better after sitting upright and getting fresh air or sitting next to fan.  The symptoms are not brought on by effort related activities and does not resolve with rest.  No active chest pain at today's evaluation.  During her recent hospitalization echocardiogram noted an incidental finding of shunting with color Doppler at the atrial level suggestive of possible ASD.  She has an appointment to see Dr. Jeralyn Bennett at [facility] in September 2023.  ALLERGIES: Allergies  Allergen Reactions   Pentazocine Anaphylaxis   Percocet [Oxycodone-Acetaminophen] Other (See Comments)    Swelling generalized   Tylenol [Acetaminophen] Swelling    MEDICATION LIST PRIOR TO VISIT: Current Meds  Medication Sig   lamoTRIgine (LAMICTAL) 100 MG tablet Take 100 mg by mouth daily.   methylPREDNISolone (MEDROL DOSEPAK) 4 MG TBPK tablet Follow instructions on packet insert.   metoprolol succinate (TOPROL XL) 25 MG 24 hr tablet Take 1 tablet (25 mg total) by mouth daily at  10 pm.   olmesartan-hydrochlorothiazide (BENICAR HCT) 20-12.5 MG tablet Take 1 tablet by mouth every morning.   ondansetron (ZOFRAN-ODT) 4 MG disintegrating tablet Take 4 mg by mouth every 8 (eight) hours as needed.   pantoprazole (PROTONIX) 20 MG tablet Take 20 mg by mouth every morning.   prazosin (MINIPRESS) 5 MG capsule Take 5 mg by mouth at bedtime.   QUEtiapine (SEROQUEL) 300 MG tablet Take 300 mg by mouth at bedtime.   rizatriptan (MAXALT) 10 MG tablet Take 10 mg by mouth as needed for migraine.      PAST MEDICAL HISTORY: Past Medical History:  Diagnosis Date   Anemia    Anxiety    Arthritis    Depression    Headache    Neuromuscular disorder ([place])    Seizures ([place])    pt has had 2 seizures in July 2018   Stroke [facility])    possible TIA in July 2018    PAST SURGICAL HISTORY: Past Surgical History:  Procedure Laterality Date   ABDOMINAL HYSTERECTOMY     2002   ANTERIOR CERVICAL DECOMP/DISCECTOMY FUSION N/A 01/26/2017   Procedure: ANTERIOR CERVICAL DECOMPRESSION/DISCECTOMY FUSION, INTERBODY PROSTHESIS, PLATE/SCREWS, POSSIBLE CORPECTOMY CERVICAL FOUR- CERVICAL FIVE, CERVICAL FIVE- CERVICAL SIX, CERVICAL SIX- CERVICAL SEVEN;  Surgeon: Newman Pies, MD;  Location: [place];  Service: Neurosurgery;  Laterality: N/A;  ANTERIOR CERVICAL DECOMPRESSION/DISCECTOMY FUSION, INTERBODY PROSTHESIS, PLATE/SCREWS, POSSIBLE CORPE   APPENDECTOMY     2010   OVARIAN CYST REMOVAL     TUBAL LIGATION     1998    FAMILY HISTORY: The patient family history includes Bladder Cancer in her maternal grandmother;  Colon cancer in her mother; Pulmonary embolism in her sister.  SOCIAL HISTORY:  The patient  reports that she has been smoking cigarettes. She has been smoking an average of .5 packs per day. She has never used smokeless tobacco. She reports that she does not drink alcohol and does not use drugs.  REVIEW OF SYSTEMS: Review of Systems  Cardiovascular:  Positive for chest pain and dyspnea on  exertion. Negative for leg swelling, orthopnea, palpitations, paroxysmal nocturnal dyspnea and syncope.  Respiratory:  Positive for shortness of breath.   Neurological:  Positive for headaches.   PHYSICAL EXAM:    07/17/2021   10:02 AM 07/17/2021    9:56 AM 06/20/2021    3:18 PM  Vitals with BMI  Height  '5\' 1"'$    Weight  152 lbs   BMI  91.63   Systolic 846 659 935  Diastolic 701 779 82  Pulse 88 100 72    CONSTITUTIONAL: Appears older than stated age, hemodynamically stable, no acute distress.   SKIN: Skin is warm and dry. No rash noted. No cyanosis. No pallor. No jaundice HEAD: Normocephalic and atraumatic.  EYES: No scleral icterus MOUTH/THROAT: Moist oral membranes.  NECK: No JVD present. No thyromegaly noted. No carotid bruits  CHEST Normal respiratory effort. No intercostal retractions  LUNGS: Clear to auscultation bilaterally.  No stridor. No wheezes. No rales.  CARDIOVASCULAR: Regular rate and rhythm, positive S1-S2, no murmurs rubs or gallops appreciated. ABDOMINAL: Soft, nontender, nondistended, positive bowel sounds in all 4 quadrants, no apparent ascites.  EXTREMITIES: No peripheral edema, warm to touch, bilateral 2+ DP and PT pulses HEMATOLOGIC: No significant bruising NEUROLOGIC: Oriented to person, place, and time. Nonfocal. Normal muscle tone.  PSYCHIATRIC: Normal mood and affect. Normal behavior. Cooperative  CARDIAC DATABASE: EKG: June 19, 2021: Normal sinus rhythm, 78 bpm, consider old anteroseptal infarct, without underlying injury pattern. 07/17/2021: Normal sinus rhythm, 86 bpm, without underlying ischemia or injury pattern.   Echocardiogram: 06/20/2021:  1. Left ventricular ejection fraction, by estimation, is 60 to 65%. The left ventricle has normal function. The left ventricle has no regional wall motion abnormalities. Left ventricular diastolic parameters were normal.   2. Right ventricular systolic function is normal. The right ventricular size is  normal. There is normal pulmonary artery systolic pressure. The estimated right ventricular systolic pressure is 39.0 mmHg.   3. Evidence of atrial level shunting detected by color flow Doppler to suggest possible ASD.   4. The mitral valve is normal in structure. No evidence of mitral valve regurgitation. No evidence of mitral stenosis.   5. The aortic valve is tricuspid. Aortic valve regurgitation is not visualized. No aortic stenosis is present.    Stress test: MPI 06/19/2021: 1. No reversible ischemia or infarction.   2. Normal left ventricular wall motion.   3. Left ventricular ejection fraction 71%   4. Non invasive risk stratification*: Low   Heart catheterization: None  LABORATORY DATA:    Latest Ref Rng & Units 06/20/2021   12:31 AM 06/18/2021    2:37 PM 02/07/2019    4:35 PM  CBC  WBC 4.0 - 10.5 K/uL 6.5   7.8   6.2    Hemoglobin 12.0 - 15.0 g/dL 12.0   13.5   13.4    Hematocrit 36.0 - 46.0 % 35.6   41.0   41.4    Platelets 150 - 400 K/uL 188   233   215         Latest Ref Rng &  Units 06/20/2021   12:31 AM 06/19/2021   12:34 AM 06/18/2021    2:37 PM  CMP  Glucose 70 - 99 mg/dL 199   108   101    BUN 6 - 20 mg/dL '16   10   9    '$ Creatinine 0.44 - 1.00 mg/dL 0.77   0.65   0.72    Sodium 135 - 145 mmol/L 138   136   141    Potassium 3.5 - 5.1 mmol/L 3.6   3.2   3.5    Chloride 98 - 111 mmol/L 108   105   109    CO2 22 - 32 mmol/L '25   24   22    '$ Calcium 8.9 - 10.3 mg/dL 9.5   9.0   9.5      Lipid Panel     Component Value Date/Time   CHOL 160 06/19/2021 0034   TRIG 123 06/19/2021 0034   HDL 37 (L) 06/19/2021 0034   CHOLHDL 4.3 06/19/2021 0034   VLDL 25 06/19/2021 0034   LDLCALC 98 06/19/2021 0034   LDLDIRECT 89.4 06/19/2021 0034    No components found for: NTPROBNP No results for input(s): PROBNP in the last 8760 hours. Recent Labs    06/19/21 0034  TSH 1.428    BMP Recent Labs    06/18/21 1437 06/19/21 0034 06/20/21 0031  NA 141 136 138  K  3.5 3.2* 3.6  CL 109 105 108  CO2 '22 24 25  '$ GLUCOSE 101* 108* 199*  BUN '9 10 16  '$ CREATININE 0.72 0.65 0.77  CALCIUM 9.5 9.0 9.5  GFRNONAA >60 >60 >60    HEMOGLOBIN A1C Lab Results  Component Value Date   HGBA1C 6.0 (H) 06/19/2021   MPG 125.5 06/19/2021    IMPRESSION:    ICD-10-CM   1. Precordial pain  R07.2 EKG 12-Lead    olmesartan-hydrochlorothiazide (BENICAR HCT) 20-12.5 MG tablet    metoprolol succinate (TOPROL XL) 25 MG 24 hr tablet    2. Dyspnea on exertion  R06.09 olmesartan-hydrochlorothiazide (BENICAR HCT) 20-12.5 MG tablet    metoprolol succinate (TOPROL XL) 25 MG 24 hr tablet    3. ASD (atrial septal defect): Possible  Q21.10     4. Benign hypertension  I10 olmesartan-hydrochlorothiazide (BENICAR HCT) 20-12.5 MG tablet    Basic metabolic panel    Magnesium    5. Tobacco abuse  Z72.0     6. Seizure disorder ([place])  G40.909     7. Migraine  G43.809     8. Cigarette smoker  F17.210        RECOMMENDATIONS: BEMNET TROVATO is a 51 y.o. African-American female whose past medical history and cardiac risk factors include: Hypertension, prediabetic, history of TIA, cigarette smoker (1 pack/day), history of seizures, history of neuromuscular disorder.  Precordial pain Predominately noncardiac based on symptomatology. During her recent hospitalization high sensitive troponins negative x3, echocardiogram noted preserved LVEF, and a nuclear stress test was low risk. D-dimer is negative. CTA chest abdomen and pelvis was performed which is negative for dissection and per report no significant coronary artery calcification. I have asked her to discuss noncardiac causes of chest pain with her PCP. Educated on seeking medical attention sooner by going to the closest ER via EMS if the symptoms increase in intensity, frequency, duration, or has typical chest pain as discussed in the office.  Patient verbalized understanding.  Dyspnea on exertion Likely secondary to  uncontrolled hypertension. We will start Benicar HCTZ  20/12.5 mg p.o. every morning. We will start metoprolol 25 mg p.o. every afternoon. Blood work in 1 week to evaluate kidney function and electrolytes.  ASD (atrial septal defect): Possible Has an appointment to follow-up with adult congenital heart at [facility] in September 2023. Monitor/follow peripherally.  Benign hypertension Office blood pressures are not well controlled. Medication changes as discussed above  Tobacco abuse Improving, now 0.5 ppd.  Tobacco cessation counseling: Currently smoking 0.5 packs/day   Patient was informed of the dangers of tobacco abuse including stroke, cancer, and MI, as well as benefits of tobacco cessation. Patient is willing to quit at this time. 5 mins were spent counseling patient cessation techniques. We discussed various methods to help quit smoking, including deciding on a date to quit, joining a support group, pharmacological agents- nicotine gum/patch/lozenges.  I will reassess her progress at the next follow-up visit  FINAL MEDICATION LIST END OF ENCOUNTER: Meds ordered this encounter  Medications   olmesartan-hydrochlorothiazide (BENICAR HCT) 20-12.5 MG tablet    Sig: Take 1 tablet by mouth every morning.    Dispense:  90 tablet    Refill:  0   metoprolol succinate (TOPROL XL) 25 MG 24 hr tablet    Sig: Take 1 tablet (25 mg total) by mouth daily at 10 pm.    Dispense:  90 tablet    Refill:  0    There are no discontinued medications.   Current Outpatient Medications:    lamoTRIgine (LAMICTAL) 100 MG tablet, Take 100 mg by mouth daily., Disp: , Rfl:    methylPREDNISolone (MEDROL DOSEPAK) 4 MG TBPK tablet, Follow instructions on packet insert., Disp: 1 each, Rfl: 0   metoprolol succinate (TOPROL XL) 25 MG 24 hr tablet, Take 1 tablet (25 mg total) by mouth daily at 10 pm., Disp: 90 tablet, Rfl: 0   olmesartan-hydrochlorothiazide (BENICAR HCT) 20-12.5 MG tablet, Take 1 tablet by mouth  every morning., Disp: 90 tablet, Rfl: 0   ondansetron (ZOFRAN-ODT) 4 MG disintegrating tablet, Take 4 mg by mouth every 8 (eight) hours as needed., Disp: , Rfl:    pantoprazole (PROTONIX) 20 MG tablet, Take 20 mg by mouth every morning., Disp: , Rfl:    prazosin (MINIPRESS) 5 MG capsule, Take 5 mg by mouth at bedtime., Disp: , Rfl:    QUEtiapine (SEROQUEL) 300 MG tablet, Take 300 mg by mouth at bedtime., Disp: , Rfl:    rizatriptan (MAXALT) 10 MG tablet, Take 10 mg by mouth as needed for migraine. , Disp: , Rfl:   Orders Placed This Encounter  Procedures   Basic metabolic panel   Magnesium   EKG 12-Lead    There are no Patient Instructions on file for this visit.   --Continue cardiac medications as reconciled in final medication list. --Return in about 4 months (around 11/02/2021) for Reevaluation of, Chest pain. or sooner if needed. --Continue follow-up with your primary care physician regarding the management of your other chronic comorbid conditions.  Patient's questions and concerns were addressed to her satisfaction. She voices understanding of the instructions provided during this encounter.   This note was created using a voice recognition software as a result there may be grammatical errors inadvertently enclosed that do not reflect the nature of this encounter. Every attempt is made to correct such errors.  Rex Kras, [place], [facility]  Pager: [phone] Office: [phone]

## 2021-10-29 ENCOUNTER — Ambulatory Visit: Payer: Medicaid Other | Admitting: Cardiology

## 2021-10-30 ENCOUNTER — Encounter: Payer: Self-pay | Admitting: Cardiology

## 2021-10-30 ENCOUNTER — Ambulatory Visit: Payer: Medicaid Other | Admitting: Cardiology

## 2021-10-30 VITALS — BP 137/98 | HR 84 | Temp 98.1°F | Resp 16 | Ht 61.0 in | Wt 158.6 lb

## 2021-10-30 DIAGNOSIS — Z720 Tobacco use: Secondary | ICD-10-CM

## 2021-10-30 DIAGNOSIS — R0609 Other forms of dyspnea: Secondary | ICD-10-CM

## 2021-10-30 DIAGNOSIS — I10 Essential (primary) hypertension: Secondary | ICD-10-CM

## 2021-10-30 DIAGNOSIS — R072 Precordial pain: Secondary | ICD-10-CM

## 2021-10-30 DIAGNOSIS — Q211 Atrial septal defect, unspecified: Secondary | ICD-10-CM

## 2021-10-30 NOTE — Progress Notes (Signed)
ID:  Heather Figueroa, DOB May 26, 1970, MRN [id number]  PCP:  Derinda Late, MD  Cardiologist:  Rex Kras, DO, [facility] (established care April 28th 2023)  Date: 10/30/21 Last Office Visit: 07/17/2021  Chief Complaint  Patient presents with   Chest Pain   Follow-up    HPI  Heather Figueroa is a 51 y.o. African-American female whose past medical history and cardiovascular risk factors include: ASD, Hypertension, prediabetic, history of TIA, cigarette smoker (1 pack/day), history of seizures, history of neuromuscular disorder.  Hospitalized in April 2023 for chest pain underwent extensive cardiovascular testing including nuclear stress test which was overall low risk study.  Work-up also noted incidental finding of possible ASD and clinically patient was asymptomatic and therefore outpatient work-up was recommended.  She has been seen at adult congenital clinic with [facility] cardiology and plans to undergo cardiac MRI as well as a heart monitor for further evaluation and management.  Clinically she denies anginal discomfort and shortness of breath happens very seldomly 2 or 3 times per month.  No precipitating factors.  Clinically euvolemic and not in congestive heart failure.  ALLERGIES: Allergies  Allergen Reactions   Pentazocine Anaphylaxis   Percocet [Oxycodone-Acetaminophen] Other (See Comments)    Swelling generalized   Tylenol [Acetaminophen] Swelling    MEDICATION LIST PRIOR TO VISIT: Current Meds  Medication Sig   amLODipine (NORVASC) 5 MG tablet Take 1 tablet by mouth daily at 12 noon.   lamoTRIgine (LAMICTAL) 100 MG tablet Take 100 mg by mouth daily.   metFORMIN (GLUCOPHAGE) 500 MG tablet Take 2 tablets by mouth daily at 12 noon.   methylPREDNISolone (MEDROL DOSEPAK) 4 MG TBPK tablet Follow instructions on packet insert.   metoprolol succinate (TOPROL XL) 25 MG 24 hr tablet Take 1 tablet (25 mg total) by mouth daily at 10 pm.   olmesartan-hydrochlorothiazide (BENICAR HCT)  20-12.5 MG tablet Take 1 tablet by mouth every morning.   ondansetron (ZOFRAN-ODT) 4 MG disintegrating tablet Take 4 mg by mouth every 8 (eight) hours as needed.   pantoprazole (PROTONIX) 20 MG tablet Take 20 mg by mouth every morning.   prazosin (MINIPRESS) 5 MG capsule Take 5 mg by mouth at bedtime.   QUEtiapine (SEROQUEL) 300 MG tablet Take 300 mg by mouth at bedtime.   rizatriptan (MAXALT) 10 MG tablet Take 10 mg by mouth as needed for migraine.      PAST MEDICAL HISTORY: Past Medical History:  Diagnosis Date   Anemia    Anxiety    Arthritis    Depression    Headache    Neuromuscular disorder ([place])    Seizures ([place])    pt has had 2 seizures in July 2018   Stroke [facility])    possible TIA in July 2018    PAST SURGICAL HISTORY: Past Surgical History:  Procedure Laterality Date   ABDOMINAL HYSTERECTOMY     2002   ANTERIOR CERVICAL DECOMP/DISCECTOMY FUSION N/A 01/26/2017   Procedure: ANTERIOR CERVICAL DECOMPRESSION/DISCECTOMY FUSION, INTERBODY PROSTHESIS, PLATE/SCREWS, POSSIBLE CORPECTOMY CERVICAL FOUR- CERVICAL FIVE, CERVICAL FIVE- CERVICAL SIX, CERVICAL SIX- CERVICAL SEVEN;  Surgeon: Newman Pies, MD;  Location: [place];  Service: Neurosurgery;  Laterality: N/A;  ANTERIOR CERVICAL DECOMPRESSION/DISCECTOMY FUSION, INTERBODY PROSTHESIS, PLATE/SCREWS, POSSIBLE CORPE   APPENDECTOMY     2010   OVARIAN CYST REMOVAL     TUBAL LIGATION     1998    FAMILY HISTORY: The patient family history includes Bladder Cancer in her maternal grandmother; Colon cancer in her mother; Pulmonary embolism  in her sister.  SOCIAL HISTORY:  The patient  reports that she has been smoking cigarettes. She has been smoking an average of 1 pack per day. She has never used smokeless tobacco. She reports current alcohol use. She reports that she does not use drugs.  REVIEW OF SYSTEMS: Review of Systems  Cardiovascular:  Positive for dyspnea on exertion. Negative for chest pain, leg swelling, orthopnea,  palpitations, paroxysmal nocturnal dyspnea and syncope.  Respiratory:  Positive for shortness of breath.   Neurological:  Positive for headaches.    PHYSICAL EXAM:    10/30/2021   10:37 AM 10/30/2021   10:34 AM 07/17/2021   10:02 AM  Vitals with BMI  Height  '5\' 1"'$    Weight  158 lbs 10 oz   BMI  51.02   Systolic 585 277 824  Diastolic 98 98 235  Pulse 84 82 88    CONSTITUTIONAL: Appears older than stated age, hemodynamically stable, no acute distress.   SKIN: Skin is warm and dry. No rash noted. No cyanosis. No pallor. No jaundice HEAD: Normocephalic and atraumatic.  EYES: No scleral icterus MOUTH/THROAT: Moist oral membranes.  NECK: No JVD present. No thyromegaly noted. No carotid bruits  CHEST Normal respiratory effort. No intercostal retractions  LUNGS: Clear to auscultation bilaterally.  No stridor. No wheezes. No rales.  CARDIOVASCULAR: Regular rate and rhythm, positive S1-S2, no murmurs rubs or gallops appreciated. ABDOMINAL: Soft, nontender, nondistended, positive bowel sounds in all 4 quadrants, no apparent ascites.  EXTREMITIES: No peripheral edema, warm to touch, bilateral 2+ DP and PT pulses HEMATOLOGIC: No significant bruising NEUROLOGIC: Oriented to person, place, and time. Nonfocal. Normal muscle tone.  PSYCHIATRIC: Normal mood and affect. Normal behavior. Cooperative  CARDIAC DATABASE: EKG: June 19, 2021: Normal sinus rhythm, 78 bpm, consider old anteroseptal infarct, without underlying injury pattern. 07/17/2021: Normal sinus rhythm, 86 bpm, without underlying ischemia or injury pattern.   Echocardiogram: 06/20/2021:  1. Left ventricular ejection fraction, by estimation, is 60 to 65%. The left ventricle has normal function. The left ventricle has no regional wall motion abnormalities. Left ventricular diastolic parameters were normal.   2. Right ventricular systolic function is normal. The right ventricular size is normal. There is normal pulmonary artery systolic  pressure. The estimated right ventricular systolic pressure is 36.1 mmHg.   3. Evidence of atrial level shunting detected by color flow Doppler to suggest possible ASD.   4. The mitral valve is normal in structure. No evidence of mitral valve regurgitation. No evidence of mitral stenosis.   5. The aortic valve is tricuspid. Aortic valve regurgitation is not visualized. No aortic stenosis is present.    Stress test: MPI 06/19/2021: 1. No reversible ischemia or infarction.   2. Normal left ventricular wall motion.   3. Left ventricular ejection fraction 71%   4. Non invasive risk stratification*: Low   Heart catheterization: None  LABORATORY DATA:    Latest Ref Rng & Units 06/20/2021   12:31 AM 06/18/2021    2:37 PM 02/07/2019    4:35 PM  CBC  WBC 4.0 - 10.5 K/uL 6.5  7.8  6.2   Hemoglobin 12.0 - 15.0 g/dL 12.0  13.5  13.4   Hematocrit 36.0 - 46.0 % 35.6  41.0  41.4   Platelets 150 - 400 K/uL 188  233  215        Latest Ref Rng & Units 06/20/2021   12:31 AM 06/19/2021   12:34 AM 06/18/2021    2:37 PM  CMP  Glucose 70 - 99 mg/dL 199  108  101   BUN 6 - 20 mg/dL '16  10  9   '$ Creatinine 0.44 - 1.00 mg/dL 0.77  0.65  0.72   Sodium 135 - 145 mmol/L 138  136  141   Potassium 3.5 - 5.1 mmol/L 3.6  3.2  3.5   Chloride 98 - 111 mmol/L 108  105  109   CO2 22 - 32 mmol/L '25  24  22   '$ Calcium 8.9 - 10.3 mg/dL 9.5  9.0  9.5     Lipid Panel     Component Value Date/Time   CHOL 160 06/19/2021 0034   TRIG 123 06/19/2021 0034   HDL 37 (L) 06/19/2021 0034   CHOLHDL 4.3 06/19/2021 0034   VLDL 25 06/19/2021 0034   LDLCALC 98 06/19/2021 0034   LDLDIRECT 89.4 06/19/2021 0034    No components found for: "NTPROBNP" No results for input(s): "PROBNP" in the last 8760 hours. Recent Labs    06/19/21 0034  TSH 1.428    BMP Recent Labs    06/18/21 1437 06/19/21 0034 06/20/21 0031  NA 141 136 138  K 3.5 3.2* 3.6  CL 109 105 108  CO2 '22 24 25  '$ GLUCOSE 101* 108* 199*  BUN '9 10 16   '$ CREATININE 0.72 0.65 0.77  CALCIUM 9.5 9.0 9.5  GFRNONAA >60 >60 >60    HEMOGLOBIN A1C Lab Results  Component Value Date   HGBA1C 6.0 (H) 06/19/2021   MPG 125.5 06/19/2021    IMPRESSION:    ICD-10-CM   1. Precordial pain  R07.2     2. Dyspnea on exertion  R06.09     3. ASD (atrial septal defect): Possible  Q21.10     4. Benign hypertension  I10     5. Tobacco abuse  Z72.0        RECOMMENDATIONS: Heather Figueroa is a 51 y.o. African-American female whose past medical history and cardiac risk factors include: ASD, hypertension, prediabetic, history of TIA, cigarette smoker (1 pack/day), history of seizures, history of neuromuscular disorder.  Precordial pain / Dyspnea on exertion No anginal discomfort since last office visit. Shortness of breath with effort related activities significantly improved-likely secondary to uncontrolled hypertension in the past. Has undergone echocardiogram and nuclear stress test in the past results reviewed and noted above for further reference. No additional testing warranted at this time.  ASD (atrial septal defect): Possible Follows up with adult congenital heart clinic. Plans to undergo cardiac MRI and heart monitor.  Benign hypertension Office blood pressures are better controlled. Patient is asked to keep a log of her blood pressures and if systolic blood pressures are consistently greater than 140 mmHg she is asked to increase amlodipine to 10 mg p.o. daily. Reemphasized the importance of a low-salt diet   FINAL MEDICATION LIST END OF ENCOUNTER: No orders of the defined types were placed in this encounter.   There are no discontinued medications.   Current Outpatient Medications:    amLODipine (NORVASC) 5 MG tablet, Take 1 tablet by mouth daily at 12 noon., Disp: , Rfl:    lamoTRIgine (LAMICTAL) 100 MG tablet, Take 100 mg by mouth daily., Disp: , Rfl:    metFORMIN (GLUCOPHAGE) 500 MG tablet, Take 2 tablets by mouth daily at  12 noon., Disp: , Rfl:    methylPREDNISolone (MEDROL DOSEPAK) 4 MG TBPK tablet, Follow instructions on packet insert., Disp: 1 each, Rfl: 0   metoprolol succinate (TOPROL  XL) 25 MG 24 hr tablet, Take 1 tablet (25 mg total) by mouth daily at 10 pm., Disp: 90 tablet, Rfl: 0   olmesartan-hydrochlorothiazide (BENICAR HCT) 20-12.5 MG tablet, Take 1 tablet by mouth every morning., Disp: 90 tablet, Rfl: 0   ondansetron (ZOFRAN-ODT) 4 MG disintegrating tablet, Take 4 mg by mouth every 8 (eight) hours as needed., Disp: , Rfl:    pantoprazole (PROTONIX) 20 MG tablet, Take 20 mg by mouth every morning., Disp: , Rfl:    prazosin (MINIPRESS) 5 MG capsule, Take 5 mg by mouth at bedtime., Disp: , Rfl:    QUEtiapine (SEROQUEL) 300 MG tablet, Take 300 mg by mouth at bedtime., Disp: , Rfl:    rizatriptan (MAXALT) 10 MG tablet, Take 10 mg by mouth as needed for migraine. , Disp: , Rfl:   No orders of the defined types were placed in this encounter.   There are no Patient Instructions on file for this visit.   --Continue cardiac medications as reconciled in final medication list. --Return in about 4 months (around 03/01/2022) for Follow up after seeing adult congenital heart clinic. or sooner if needed. --Continue follow-up with your primary care physician regarding the management of your other chronic comorbid conditions.  Patient's questions and concerns were addressed to her satisfaction. She voices understanding of the instructions provided during this encounter.   This note was created using a voice recognition software as a result there may be grammatical errors inadvertently enclosed that do not reflect the nature of this encounter. Every attempt is made to correct such errors.  Rex Kras, [place], [facility]  Pager: [phone] Office: [phone]

## 2021-11-03 ENCOUNTER — Ambulatory Visit: Payer: Medicaid Other | Admitting: Cardiology

## 2022-02-25 ENCOUNTER — Ambulatory Visit: Payer: Medicaid Other | Admitting: Cardiology

## 2022-02-25 ENCOUNTER — Encounter: Payer: Self-pay | Admitting: Cardiology

## 2022-02-25 VITALS — BP 150/89 | HR 87 | Resp 15 | Ht 61.0 in | Wt 160.0 lb

## 2022-02-25 DIAGNOSIS — F17210 Nicotine dependence, cigarettes, uncomplicated: Secondary | ICD-10-CM

## 2022-02-25 DIAGNOSIS — Q2112 Patent foramen ovale: Secondary | ICD-10-CM

## 2022-02-25 DIAGNOSIS — I10 Essential (primary) hypertension: Secondary | ICD-10-CM

## 2022-02-25 DIAGNOSIS — R0609 Other forms of dyspnea: Secondary | ICD-10-CM

## 2022-02-25 DIAGNOSIS — R072 Precordial pain: Secondary | ICD-10-CM

## 2022-02-25 MED ORDER — OLMESARTAN MEDOXOMIL-HCTZ 40-25 MG PO TABS: 1.0000 | ORAL_TABLET | Freq: Every day | ORAL | 2 refills | Status: DC

## 2022-02-25 MED ORDER — NICOTINE POLACRILEX 4 MG MT LOZG: 4.0000 mg | LOZENGE | OROMUCOSAL | 0 refills | Status: DC | PRN

## 2022-02-25 MED ORDER — NICOTINE 7 MG/24HR TD PT24: 7.0000 mg | MEDICATED_PATCH | Freq: Every day | TRANSDERMAL | 0 refills | Status: DC

## 2022-02-25 NOTE — Progress Notes (Signed)
ID:  Heather Figueroa, DOB Feb 12, 1971, MRN [id number]  PCP:  Derinda Late, MD  Cardiologist:  Rex Kras, DO, [facility] (established care April 28th 2023)  Date: 02/25/22 Last Office Visit: 10/30/2021  Chief Complaint  Patient presents with   Follow-up    PFO    HPI  Heather Figueroa is a 52 y.o. African-American female whose past medical history and cardiovascular risk factors include: small PFO (per CMR 12/2021), Hypertension, prediabetic, history of TIA, smoker (quit 0.25/ppd), history of seizures, history of neuromuscular disorder.  During her last hospitalization in April 2023 patient underwent extensive cardiovascular workup given her chest pain.  Workup noted an incidental finding of a possible small ASD/PFO.  Given her history of TIA recommended consideration for closure.  Patient establish care with adult congenital clinic at [facility] and underwent cardiac MRI and was noted to have a small PFO with Qp/Qs within normal limits.She was advised not to close the PFO at this time.  In the interim she has stopped smoking cigarettes.  But at times has cravings and does smoke a few at a time.  She is motivated with regards to complete smoking cessation and is willing to try nicotine patches and lozenges.  Home blood pressures range between 130-140 mmHg.  She did not take her morning medications that she was running late for the appointment.  Which may be contributory to her elevated blood pressures at today's visit.  Precordial discomfort and shortness of breath are relatively stable.  No increase in intensity frequency or duration.  No change in overall physical endurance.  ALLERGIES: Allergies  Allergen Reactions   Pentazocine Anaphylaxis   Percocet [Oxycodone-Acetaminophen] Other (See Comments)    Swelling generalized   Tylenol [Acetaminophen] Swelling    MEDICATION LIST PRIOR TO VISIT: Current Meds  Medication Sig   amLODipine (NORVASC) 5 MG tablet Take 1 tablet by mouth daily  at 12 noon.   cholecalciferol (VITAMIN D3) 25 MCG (1000 UNIT) tablet Take 1,000 Units by mouth daily.   lamoTRIgine (LAMICTAL) 100 MG tablet Take 100 mg by mouth daily.   linaclotide (LINZESS) 72 MCG capsule Take 72 mcg by mouth daily before breakfast.   metFORMIN (GLUCOPHAGE) 500 MG tablet Take 2 tablets by mouth daily at 12 noon.   nicotine (NICODERM CQ - DOSED IN MG/24 HR) 7 mg/24hr patch Place 1 patch (7 mg total) onto the skin daily.   nicotine polacrilex (NICOTINE MINI) 4 MG lozenge Take 1 lozenge (4 mg total) by mouth as needed for smoking cessation.   olmesartan-hydrochlorothiazide (BENICAR HCT) 40-25 MG tablet Take 1 tablet by mouth daily.   ondansetron (ZOFRAN-ODT) 4 MG disintegrating tablet Take 4 mg by mouth every 8 (eight) hours as needed.   pantoprazole (PROTONIX) 20 MG tablet Take 20 mg by mouth every morning.   prazosin (MINIPRESS) 5 MG capsule Take 5 mg by mouth at bedtime.   QUEtiapine (SEROQUEL) 300 MG tablet Take 300 mg by mouth at bedtime.   rizatriptan (MAXALT) 10 MG tablet Take 10 mg by mouth as needed for migraine.    [DISCONTINUED] olmesartan-hydrochlorothiazide (BENICAR HCT) 20-12.5 MG tablet Take 1 tablet by mouth every morning.     PAST MEDICAL HISTORY: Past Medical History:  Diagnosis Date   Anemia    Anxiety    Arthritis    Depression    Headache    Neuromuscular disorder ([place])    Seizures ([place])    pt has had 2 seizures in July 2018   Stroke [facility])  possible TIA in July 2018    PAST SURGICAL HISTORY: Past Surgical History:  Procedure Laterality Date   ABDOMINAL HYSTERECTOMY     2002   ANTERIOR CERVICAL DECOMP/DISCECTOMY FUSION N/A 01/26/2017   Procedure: ANTERIOR CERVICAL DECOMPRESSION/DISCECTOMY FUSION, INTERBODY PROSTHESIS, PLATE/SCREWS, POSSIBLE CORPECTOMY CERVICAL FOUR- CERVICAL FIVE, CERVICAL FIVE- CERVICAL SIX, CERVICAL SIX- CERVICAL SEVEN;  Surgeon: Newman Pies, MD;  Location: [place];  Service: Neurosurgery;  Laterality: N/A;  ANTERIOR  CERVICAL DECOMPRESSION/DISCECTOMY FUSION, INTERBODY PROSTHESIS, PLATE/SCREWS, POSSIBLE CORPE   APPENDECTOMY     2010   OVARIAN CYST REMOVAL     TUBAL LIGATION     1998    FAMILY HISTORY: The patient family history includes Bladder Cancer in her maternal grandmother; Colon cancer in her mother; Hypertension in her father; Prostate cancer in her father; Pulmonary embolism in her sister.  SOCIAL HISTORY:  The patient  reports that she has been smoking cigarettes. She has been smoking an average of 1 pack per day. She has never used smokeless tobacco. She reports current alcohol use. She reports that she does not use drugs.  REVIEW OF SYSTEMS: Review of Systems  Cardiovascular:  Positive for dyspnea on exertion (chronic and stable). Negative for chest pain, leg swelling, orthopnea, palpitations, paroxysmal nocturnal dyspnea and syncope.  Respiratory:  Positive for shortness of breath (chronic and stable).   Neurological:  Positive for headaches (chronic and stable).    PHYSICAL EXAM:    02/25/2022   10:24 AM 10/30/2021   10:37 AM 10/30/2021   10:34 AM  Vitals with BMI  Height '5\' 1"'$   '5\' 1"'$   Weight 160 lbs  158 lbs 10 oz  BMI 32.67  12.45  Systolic 809 983 382  Diastolic 89 98 98  Pulse 87 84 82   Physical Exam  Constitutional:  Age appropriate, hemodynamically stable, no acute distress.   Neck: No JVD present.  Cardiovascular: Normal rate, regular rhythm, S1 normal and S2 normal. Exam reveals no gallop and no friction rub.  No murmur heard. Pulmonary/Chest: Breath sounds normal. She has no wheezes. She has no rales. She exhibits no tenderness.  Abdominal: Soft. Bowel sounds are normal. She exhibits no distension. There is no abdominal tenderness.  Musculoskeletal:        General: No tenderness or edema.  Neurological: She is alert and oriented to person, place, and time.  Skin: Skin is warm and dry.   CARDIAC DATABASE: EKG: 02/25/2022: Normal sinus rhythm, 79 bpm, without  underlying ischemia or injury pattern..   Echocardiogram: 06/20/2021:  1. Left ventricular ejection fraction, by estimation, is 60 to 65%. The left ventricle has normal function. The left ventricle has no regional wall motion abnormalities. Left ventricular diastolic parameters were normal.   2. Right ventricular systolic function is normal. The right ventricular size is normal. There is normal pulmonary artery systolic pressure. The estimated right ventricular systolic pressure is 50.5 mmHg.   3. Evidence of atrial level shunting detected by color flow Doppler to suggest possible ASD.   4. The mitral valve is normal in structure. No evidence of mitral valve regurgitation. No evidence of mitral stenosis.   5. The aortic valve is tricuspid. Aortic valve regurgitation is not visualized. No aortic stenosis is present.    Stress test: MPI 06/19/2021: 1. No reversible ischemia or infarction.   2. Normal left ventricular wall motion.   3. Left ventricular ejection fraction 71%   4. Non invasive risk stratification*: Low   Heart catheterization: None  Cardiac MRI 01/04/22:  1. The left ventricle is normal in cavity size and wall thickness. Global systolic function is normal. The LV ejection fraction is 64%. There are no regional wall motion abnormalities.  2. The right ventricle is normal in cavity size, wall thickness, and systolic function.  3. Both atria are normal in size. There is evidence of a small patent foramen ovale (PFO). however, the QP:QS is normal.  4. The aortic valve is trileaflet in morphology. There is no significant valvular disease.  5. Delayed enhancement imaging demonstrates no evidence of myocardial infarction, scar or infiltrative disease.  6. There is no evidence of an intracardiac thrombus.  7. The pericardium is normal in thickness. There is no pericardial effusion.  Chest MRA:  1. The aortic root is normal in size. The ascending aorta, arch, and  descending aorta are normal in diameter. There is no dissection seen.  2. The aortic arch is left sided. There is bovine (normal variant) branching of the arch vessels.  3. The main and proximal branch pulmonary arteries are normal in size.  4. Normal systemic venous connections.  LABORATORY DATA:    Latest Ref Rng & Units 06/20/2021   12:31 AM 06/18/2021    2:37 PM 02/07/2019    4:35 PM  CBC  WBC 4.0 - 10.5 K/uL 6.5  7.8  6.2   Hemoglobin 12.0 - 15.0 g/dL 12.0  13.5  13.4   Hematocrit 36.0 - 46.0 % 35.6  41.0  41.4   Platelets 150 - 400 K/uL 188  233  215        Latest Ref Rng & Units 06/20/2021   12:31 AM 06/19/2021   12:34 AM 06/18/2021    2:37 PM  CMP  Glucose 70 - 99 mg/dL 199  108  101   BUN 6 - 20 mg/dL '16  10  9   '$ Creatinine 0.44 - 1.00 mg/dL 0.77  0.65  0.72   Sodium 135 - 145 mmol/L 138  136  141   Potassium 3.5 - 5.1 mmol/L 3.6  3.2  3.5   Chloride 98 - 111 mmol/L 108  105  109   CO2 22 - 32 mmol/L '25  24  22   '$ Calcium 8.9 - 10.3 mg/dL 9.5  9.0  9.5     Lipid Panel     Component Value Date/Time   CHOL 160 06/19/2021 0034   TRIG 123 06/19/2021 0034   HDL 37 (L) 06/19/2021 0034   CHOLHDL 4.3 06/19/2021 0034   VLDL 25 06/19/2021 0034   LDLCALC 98 06/19/2021 0034   LDLDIRECT 89.4 06/19/2021 0034    No components found for: "NTPROBNP" No results for input(s): "PROBNP" in the last 8760 hours. Recent Labs    06/19/21 0034  TSH 1.428    BMP Recent Labs    06/18/21 1437 06/19/21 0034 06/20/21 0031  NA 141 136 138  K 3.5 3.2* 3.6  CL 109 105 108  CO2 '22 24 25  '$ GLUCOSE 101* 108* 199*  BUN '9 10 16  '$ CREATININE 0.72 0.65 0.77  CALCIUM 9.5 9.0 9.5  GFRNONAA >60 >60 >60    HEMOGLOBIN A1C Lab Results  Component Value Date   HGBA1C 6.0 (H) 06/19/2021   MPG 125.5 06/19/2021    IMPRESSION:    ICD-10-CM   1. PFO (patent foramen ovale)  Q21.12 EKG 12-Lead    2. Benign hypertension  I10 olmesartan-hydrochlorothiazide (BENICAR HCT) 40-25 MG tablet     Basic metabolic panel    Magnesium  3. Precordial pain  R07.2     4. Dyspnea on exertion  R06.09     5. Cigarette smoker  F17.210 nicotine (NICODERM CQ - DOSED IN MG/24 HR) 7 mg/24hr patch    nicotine polacrilex (NICOTINE MINI) 4 MG lozenge       RECOMMENDATIONS: CERRIA RANDHAWA is a 52 y.o. African-American female whose past medical history and cardiac risk factors include: small PFO (per CMR 12/2021), Hypertension, prediabetic, history of TIA, smoker (quit 0.25/ppd), history of seizures, history of neuromuscular disorder.  PFO (patent foramen ovale) Small size as per cardiac MRI. Has been evaluated by adult congenital cardiology given the concerns for possible ASD on surface echocardiogram from April 2023. Per adult congenital cardiology no indications for closure at this time. Her history of TIA is questionable with concomitant symptoms of seizures. Monitor for now.  Benign hypertension Office blood pressure not well-controlled. Did not take her morning medications which include (Benicar/HCTZ, Toprol-XL, amlodipine). She is noted to have elevated blood pressures at other office visits as well. Home blood pressures are also currently not at goal. I have asked her to take Benicar and Toprol-XL in the morning.  Amlodipine at night. Will increase Benicar HCTZ to 40/25 mg p.o. every morning with labs in 1 week to evaluate kidney function and electrolytes  Precordial pain / Dyspnea on exertion Overall improving. No effort related symptoms & no  change in intensity frequency and duration. I advised her to discuss lung cancer screening/COPD evaluation with PCP given her extensive history of smoking  Cigarette smoker Tobacco cessation counseling: Currently smoking 0.25 packs/day   Patient is willing to quit at this time. 10 mins were spent counseling patient cessation techniques. We discussed various methods to help quit smoking, including deciding on a date to quit, joining a  support group, pharmacological agents- nicotine gum/patch/lozenges.  Will send a prescription for nicotine patches and lozenges to use on a as needed basis.  Advised to talk to PCP with regards to lung cancer screening given the extensive history of smoking. I will reassess her progress at the next follow-up visit  FINAL MEDICATION LIST END OF ENCOUNTER: Meds ordered this encounter  Medications   nicotine (NICODERM CQ - DOSED IN MG/24 HR) 7 mg/24hr patch    Sig: Place 1 patch (7 mg total) onto the skin daily.    Dispense:  28 patch    Refill:  0   nicotine polacrilex (NICOTINE MINI) 4 MG lozenge    Sig: Take 1 lozenge (4 mg total) by mouth as needed for smoking cessation.    Dispense:  100 tablet    Refill:  0   olmesartan-hydrochlorothiazide (BENICAR HCT) 40-25 MG tablet    Sig: Take 1 tablet by mouth daily.    Dispense:  30 tablet    Refill:  2    Medications Discontinued During This Encounter  Medication Reason   methylPREDNISolone (MEDROL DOSEPAK) 4 MG TBPK tablet Completed Course   olmesartan-hydrochlorothiazide (BENICAR HCT) 20-12.5 MG tablet Dose change     Current Outpatient Medications:    amLODipine (NORVASC) 5 MG tablet, Take 1 tablet by mouth daily at 12 noon., Disp: , Rfl:    cholecalciferol (VITAMIN D3) 25 MCG (1000 UNIT) tablet, Take 1,000 Units by mouth daily., Disp: , Rfl:    lamoTRIgine (LAMICTAL) 100 MG tablet, Take 100 mg by mouth daily., Disp: , Rfl:    linaclotide (LINZESS) 72 MCG capsule, Take 72 mcg by mouth daily before breakfast., Disp: , Rfl:  metFORMIN (GLUCOPHAGE) 500 MG tablet, Take 2 tablets by mouth daily at 12 noon., Disp: , Rfl:    nicotine (NICODERM CQ - DOSED IN MG/24 HR) 7 mg/24hr patch, Place 1 patch (7 mg total) onto the skin daily., Disp: 28 patch, Rfl: 0   nicotine polacrilex (NICOTINE MINI) 4 MG lozenge, Take 1 lozenge (4 mg total) by mouth as needed for smoking cessation., Disp: 100 tablet, Rfl: 0   olmesartan-hydrochlorothiazide  (BENICAR HCT) 40-25 MG tablet, Take 1 tablet by mouth daily., Disp: 30 tablet, Rfl: 2   ondansetron (ZOFRAN-ODT) 4 MG disintegrating tablet, Take 4 mg by mouth every 8 (eight) hours as needed., Disp: , Rfl:    pantoprazole (PROTONIX) 20 MG tablet, Take 20 mg by mouth every morning., Disp: , Rfl:    prazosin (MINIPRESS) 5 MG capsule, Take 5 mg by mouth at bedtime., Disp: , Rfl:    QUEtiapine (SEROQUEL) 300 MG tablet, Take 300 mg by mouth at bedtime., Disp: , Rfl:    rizatriptan (MAXALT) 10 MG tablet, Take 10 mg by mouth as needed for migraine. , Disp: , Rfl:    metoprolol succinate (TOPROL XL) 25 MG 24 hr tablet, Take 1 tablet (25 mg total) by mouth daily at 10 pm., Disp: 90 tablet, Rfl: 0  Orders Placed This Encounter  Procedures   Basic metabolic panel   Magnesium   EKG 12-Lead   There are no Patient Instructions on file for this visit.   --Continue cardiac medications as reconciled in final medication list. --Return in about 6 months (around 08/26/2022) for Follow up. or sooner if needed. --Continue follow-up with your primary care physician regarding the management of your other chronic comorbid conditions.  Patient's questions and concerns were addressed to her satisfaction. She voices understanding of the instructions provided during this encounter.   This note was created using a voice recognition software as a result there may be grammatical errors inadvertently enclosed that do not reflect the nature of this encounter. Every attempt is made to correct such errors.  Rex Kras, [place], [facility]  Pager: [phone] Office: [phone]

## 2022-03-04 ENCOUNTER — Other Ambulatory Visit: Payer: Self-pay | Admitting: Cardiology

## 2022-03-05 LAB — BASIC METABOLIC PANEL
BUN/Creatinine Ratio: 16 (ref 9–23)
BUN: 12 mg/dL (ref 6–24)
CO2: 21 mmol/L (ref 20–29)
Calcium: 9.6 mg/dL (ref 8.7–10.2)
Chloride: 106 mmol/L (ref 96–106)
Creatinine, Ser: 0.73 mg/dL (ref 0.57–1.00)
Glucose: 100 mg/dL — ABNORMAL HIGH (ref 70–99)
Potassium: 3.9 mmol/L (ref 3.5–5.2)
Sodium: 142 mmol/L (ref 134–144)
eGFR: 100 mL/min/{1.73_m2} (ref >59)

## 2022-03-05 LAB — MAGNESIUM: Magnesium: 2.3 mg/dL (ref 1.6–2.3)

## 2022-03-08 NOTE — Progress Notes (Signed)
Called and spoke with patient regarding her recent lab results. Patient stated that her BP's have been in the 130/80 range and she feels a lot better.

## 2022-04-02 ENCOUNTER — Encounter: Payer: Self-pay | Admitting: Gastroenterology

## 2022-04-04 NOTE — H&P (Signed)
Pre-Procedure H&P   Patient ID: Heather Figueroa is a 52 y.o. female.  Gastroenterology Provider: Annamaria Helling, DO  Referring Provider: Dawson Bills, NP PCP: Derinda Late, MD  Date: 04/05/2022  HPI Heather Figueroa is a 52 y.o. female who presents today for Esophagogastroduodenoscopy and Colonoscopy for GERD, dysphagia and constipation, family history of colon cancer .  No previous history of colonoscopy.    Deals with chronic constipation with 1 bowel movement every 2 weeks.  No melena or hematochezia.  She does experience abdominal discomfort the longer she is without a bowel movement.  She is status post appendectomy and hysterectomy  She has a family history of colon cancer in her mother age 10.  Maternal grandfather colorectal cancer at age 51.  Paternal grandmother possibly gastric cancer  Previous vomiting and reflux controlled with PPI.  She does feel food sticking in the clavicle area but no odynophagia.  She does take 3-4 Goody powders a day since 2018.  Now down to 2 per day. Stool H. pylori negative  Most recent lab work creatinine 0.7 hemoglobin 13.78 MCV 80.7 platelets 214,000  Previously smoked 2 packs a day of cigarettes now down to half pack a day.  CVA in 2017.  History of ASD and atypical chest pain.  History of seizures.  Past Medical History:  Diagnosis Date   Anemia    Anxiety    Arthritis    Depression    Headache    Hypertension    Neuromuscular disorder ([place])    Seizures ([place])    pt has had 2 seizures in July 2018   Stroke [facility])    possible TIA in July 2018    Past Surgical History:  Procedure Laterality Date   ABDOMINAL HYSTERECTOMY     2002   ANTERIOR CERVICAL DECOMP/DISCECTOMY FUSION N/A 01/26/2017   Procedure: ANTERIOR CERVICAL DECOMPRESSION/DISCECTOMY FUSION, INTERBODY PROSTHESIS, PLATE/SCREWS, POSSIBLE CORPECTOMY CERVICAL FOUR- CERVICAL FIVE, CERVICAL FIVE- CERVICAL SIX, CERVICAL SIX- CERVICAL SEVEN;  Surgeon: Newman Pies, MD;  Location: [place];  Service: Neurosurgery;  Laterality: N/A;  ANTERIOR CERVICAL DECOMPRESSION/DISCECTOMY FUSION, INTERBODY PROSTHESIS, PLATE/SCREWS, POSSIBLE CORPE   APPENDECTOMY     2010   OVARIAN CYST REMOVAL     TUBAL LIGATION     1998    Family History She has a family history of colon cancer in her mother age 10.  Maternal grandfather colorectal cancer at age 35.  Paternal grandmother possibly gastric cancer No h/o GI disease or malignancy  Review of Systems  Constitutional:  Negative for activity change, appetite change, chills, diaphoresis, fatigue, fever and unexpected weight change.  HENT:  Positive for trouble swallowing. Negative for voice change.   Respiratory:  Negative for shortness of breath and wheezing.   Cardiovascular:  Negative for chest pain, palpitations and leg swelling.  Gastrointestinal:  Positive for abdominal pain and constipation. Negative for abdominal distention, anal bleeding, blood in stool, diarrhea, nausea, rectal pain and vomiting.  Musculoskeletal:  Negative for arthralgias and myalgias.  Skin:  Negative for color change and pallor.  Neurological:  Negative for dizziness, syncope and weakness.  Psychiatric/Behavioral:  Negative for confusion.   All other systems reviewed and are negative.    Medications No current facility-administered medications on file prior to encounter.   Current Outpatient Medications on File Prior to Encounter  Medication Sig Dispense Refill   amLODipine (NORVASC) 5 MG tablet Take 1 tablet by mouth daily at 12 noon.     lamoTRIgine (LAMICTAL)  100 MG tablet Take 100 mg by mouth daily.     metFORMIN (GLUCOPHAGE) 500 MG tablet Take 2 tablets by mouth daily at 12 noon.     prazosin (MINIPRESS) 5 MG capsule Take 5 mg by mouth at bedtime.     QUEtiapine (SEROQUEL) 300 MG tablet Take 300 mg by mouth at bedtime.     metoprolol succinate (TOPROL XL) 25 MG 24 hr tablet Take 1 tablet (25 mg total) by mouth daily at 10 pm.  90 tablet 0   ondansetron (ZOFRAN-ODT) 4 MG disintegrating tablet Take 4 mg by mouth every 8 (eight) hours as needed.     pantoprazole (PROTONIX) 20 MG tablet Take 20 mg by mouth every morning.     rizatriptan (MAXALT) 10 MG tablet Take 10 mg by mouth as needed for migraine.       Pertinent medications related to GI and procedure were reviewed by me with the patient prior to the procedure   Current Facility-Administered Medications:    0.9 %  sodium chloride infusion, , Intravenous, Continuous, Annamaria Helling, DO      Allergies  Allergen Reactions   Pentazocine Anaphylaxis   Percocet [Oxycodone-Acetaminophen] Other (See Comments)    Swelling generalized   Tylenol [Acetaminophen] Swelling   Allergies were reviewed by me prior to the procedure  Objective   Body mass index is 30.04 kg/m. Vitals:   04/05/22 1017  BP: (!) 143/100  Pulse: 83  Resp: 18  Temp: (!) 97 F (36.1 C)  TempSrc: Temporal  SpO2: 99%  Weight: 72.1 kg  Height: 5' 1"$  (1.549 m)     Physical Exam Vitals and nursing note reviewed.  Constitutional:      General: She is not in acute distress.    Appearance: Normal appearance. She is not ill-appearing, toxic-appearing or diaphoretic.  HENT:     Head: Normocephalic and atraumatic.     Nose: Nose normal.     Mouth/Throat:     Mouth: Mucous membranes are moist.     Pharynx: Oropharynx is clear.  Eyes:     General: No scleral icterus.    Extraocular Movements: Extraocular movements intact.  Cardiovascular:     Rate and Rhythm: Normal rate and regular rhythm.     Heart sounds: Normal heart sounds. No murmur heard.    No friction rub. No gallop.  Pulmonary:     Effort: Pulmonary effort is normal. No respiratory distress.     Breath sounds: Normal breath sounds. No wheezing, rhonchi or rales.  Abdominal:     General: Bowel sounds are normal. There is no distension.     Palpations: Abdomen is soft.     Tenderness: There is no abdominal  tenderness. There is no guarding or rebound.  Musculoskeletal:     Cervical back: Neck supple.     Right lower leg: No edema.     Left lower leg: No edema.  Skin:    General: Skin is warm and dry.     Coloration: Skin is not jaundiced or pale.  Neurological:     General: No focal deficit present.     Mental Status: She is alert and oriented to person, place, and time. Mental status is at baseline.  Psychiatric:        Mood and Affect: Mood normal.        Behavior: Behavior normal.        Thought Content: Thought content normal.        Judgment: Judgment normal.  Assessment:  Heather Figueroa is a 52 y.o. female  who presents today for Esophagogastroduodenoscopy and Colonoscopy for GERD, dysphagia and constipation, family history of colon cancer .  Plan:  Esophagogastroduodenoscopy and Colonoscopy with possible intervention today  Esophagogastroduodenoscopy and Colonoscopy with possible biopsy, control of bleeding, polypectomy, and interventions as necessary has been discussed with the patient/patient representative. Informed consent was obtained from the patient/patient representative after explaining the indication, nature, and risks of the procedure including but not limited to death, bleeding, perforation, missed neoplasm/lesions, cardiorespiratory compromise, and reaction to medications. Opportunity for questions was given and appropriate answers were provided. Patient/patient representative has verbalized understanding is amenable to undergoing the procedure.   Annamaria Helling, DO  [facility]  Portions of the record may have been created with voice recognition software. Occasional wrong-word or 'sound-a-like' substitutions may have occurred due to the inherent limitations of voice recognition software.  Read the chart carefully and recognize, using context, where substitutions may have occurred.

## 2022-04-05 ENCOUNTER — Encounter: Payer: Self-pay | Admitting: Gastroenterology

## 2022-04-05 ENCOUNTER — Ambulatory Visit: Payer: Medicaid Other | Admitting: Certified Registered"

## 2022-04-05 ENCOUNTER — Encounter
Admission: RE | Disposition: A | Discharge status: Home / Self Care | Payer: Self-pay | Source: Home / Self Care | Attending: Gastroenterology

## 2022-04-05 ENCOUNTER — Ambulatory Visit
Admission: RE | Admit: 2022-04-05 | Discharge: 2022-04-05 | Disposition: A | Discharge status: Home / Self Care | Payer: Medicaid Other | Attending: Gastroenterology | Admitting: Gastroenterology

## 2022-04-05 DIAGNOSIS — K644 Residual hemorrhoidal skin tags: Secondary | ICD-10-CM | POA: Diagnosis not present

## 2022-04-05 DIAGNOSIS — K2960 Other gastritis without bleeding: Secondary | ICD-10-CM | POA: Insufficient documentation

## 2022-04-05 DIAGNOSIS — K5909 Other constipation: Secondary | ICD-10-CM | POA: Diagnosis not present

## 2022-04-05 DIAGNOSIS — M1990 Unspecified osteoarthritis, unspecified site: Secondary | ICD-10-CM | POA: Insufficient documentation

## 2022-04-05 DIAGNOSIS — I10 Essential (primary) hypertension: Secondary | ICD-10-CM | POA: Insufficient documentation

## 2022-04-05 DIAGNOSIS — R1310 Dysphagia, unspecified: Secondary | ICD-10-CM | POA: Insufficient documentation

## 2022-04-05 DIAGNOSIS — K259 Gastric ulcer, unspecified as acute or chronic, without hemorrhage or perforation: Secondary | ICD-10-CM | POA: Diagnosis not present

## 2022-04-05 DIAGNOSIS — K641 Second degree hemorrhoids: Secondary | ICD-10-CM | POA: Diagnosis not present

## 2022-04-05 DIAGNOSIS — F32A Depression, unspecified: Secondary | ICD-10-CM | POA: Insufficient documentation

## 2022-04-05 DIAGNOSIS — D123 Benign neoplasm of transverse colon: Secondary | ICD-10-CM | POA: Diagnosis not present

## 2022-04-05 DIAGNOSIS — R569 Unspecified convulsions: Secondary | ICD-10-CM | POA: Insufficient documentation

## 2022-04-05 DIAGNOSIS — Z1211 Encounter for screening for malignant neoplasm of colon: Secondary | ICD-10-CM | POA: Diagnosis present

## 2022-04-05 DIAGNOSIS — K219 Gastro-esophageal reflux disease without esophagitis: Secondary | ICD-10-CM | POA: Insufficient documentation

## 2022-04-05 DIAGNOSIS — Z800 Family history of malignant neoplasm of digestive organs: Secondary | ICD-10-CM | POA: Insufficient documentation

## 2022-04-05 DIAGNOSIS — F419 Anxiety disorder, unspecified: Secondary | ICD-10-CM | POA: Diagnosis not present

## 2022-04-05 DIAGNOSIS — Z7984 Long term (current) use of oral hypoglycemic drugs: Secondary | ICD-10-CM | POA: Diagnosis not present

## 2022-04-05 DIAGNOSIS — R519 Headache, unspecified: Secondary | ICD-10-CM | POA: Insufficient documentation

## 2022-04-05 DIAGNOSIS — F17210 Nicotine dependence, cigarettes, uncomplicated: Secondary | ICD-10-CM | POA: Insufficient documentation

## 2022-04-05 HISTORY — DX: Essential (primary) hypertension: I10

## 2022-04-05 HISTORY — PX: COLONOSCOPY: SHX5424

## 2022-04-05 HISTORY — PX: ESOPHAGOGASTRODUODENOSCOPY: SHX5428

## 2022-04-05 SURGERY — COLONOSCOPY
Anesthesia: General

## 2022-04-05 MED ORDER — SODIUM CHLORIDE 0.9 % IV SOLN: INTRAVENOUS | Status: DC

## 2022-04-05 MED ORDER — ALBUTEROL SULFATE HFA 108 (90 BASE) MCG/ACT IN AERS
INHALATION_SPRAY | RESPIRATORY_TRACT | Status: DC | PRN
  Administered 2022-04-05: 4 via RESPIRATORY_TRACT

## 2022-04-05 MED ORDER — LIDOCAINE HCL (CARDIAC) PF 100 MG/5ML IV SOSY
PREFILLED_SYRINGE | INTRAVENOUS | Status: DC | PRN
  Administered 2022-04-05: 100 mg via INTRAVENOUS

## 2022-04-05 MED ORDER — PROPOFOL 500 MG/50ML IV EMUL
INTRAVENOUS | Status: DC | PRN
  Administered 2022-04-05: 100 mg via INTRAVENOUS
  Administered 2022-04-05: 150 ug/kg/min via INTRAVENOUS
  Administered 2022-04-05: 50 mg via INTRAVENOUS

## 2022-04-05 NOTE — Anesthesia Preprocedure Evaluation (Addendum)
Anesthesia Evaluation  Patient identified by MRN, date of birth, ID band Patient awake    Reviewed: Allergy & Precautions, H&P , NPO status , Patient's Chart, lab work & pertinent test results  Airway Mallampati: II  TM Distance: >3 FB Neck ROM: full    Dental  (+) Teeth Intact, Dental Advisory Given   Pulmonary Current Smoker   breath sounds clear to auscultation       Cardiovascular hypertension,  Rhythm:regular Rate:Normal     Neuro/Psych  Headaches, Seizures -,  PSYCHIATRIC DISORDERS Anxiety Depression     Neuromuscular disease CVA    GI/Hepatic   Endo/Other    Renal/GU      Musculoskeletal  (+) Arthritis ,    Abdominal Normal abdominal exam  (+)   Peds  Hematology   Anesthesia Other Findings   Reproductive/Obstetrics                             Anesthesia Physical Anesthesia Plan  ASA: III  Anesthesia Plan: General   Post-op Pain Management:    Induction: Intravenous  PONV Risk Score and Plan: 2 and Treatment may vary due to age or medical condition  Airway Management Planned: Natural Airway  Additional Equipment:   Intra-op Plan:   Post-operative Plan:   Informed Consent: I have reviewed the patients History and Physical, chart, labs and discussed the procedure including the risks, benefits and alternatives for the proposed anesthesia with the patient or authorized representative who has indicated his/her understanding and acceptance.       Plan Discussed with: CRNA, Anesthesiologist and Surgeon  Anesthesia Plan Comments:         Anesthesia Quick Evaluation

## 2022-04-05 NOTE — Transfer of Care (Signed)
Immediate Anesthesia Transfer of Care Note  Patient: Heather Figueroa  Procedure(s) Performed: COLONOSCOPY ESOPHAGOGASTRODUODENOSCOPY (EGD)  Patient Location: PACU  Anesthesia Type:General  Level of Consciousness: awake and alert   Airway & Oxygen Therapy: Patient Spontanous Breathing  Post-op Assessment: Report given to RN and Post -op Vital signs reviewed and stable  Post vital signs: Reviewed and stable  Last Vitals:  Vitals Value Taken Time  BP    Temp    Pulse    Resp    SpO2      Last Pain:  Vitals:   04/05/22 1017  TempSrc: Temporal         Complications: No notable events documented.

## 2022-04-05 NOTE — Op Note (Signed)
[facility] Gastroenterology Patient Name: Heather Figueroa Procedure Date: 04/05/2022 10:30 AM MRN: [id number] Account #: [id number] Date of Birth: 05-20-1970 Admit Type: Outpatient Age: 52 Room: [facility] ENDO ROOM 2 Gender: Female Note Status: Finalized Instrument Name: Jasper Riling [id number] Procedure:             Colonoscopy Indications:           Screening patient at increased risk: Family history of                         colorectal cancer in multiple 1st-degree relatives Providers:             Annamaria Helling DO, DO Medicines:             Monitored Anesthesia Care Complications:         No immediate complications. Estimated blood loss:                         Minimal. Procedure:             Pre-Anesthesia Assessment:                        - Prior to the procedure, a History and Physical was                         performed, and patient medications and allergies were                         reviewed. The patient is competent. The risks and                         benefits of the procedure and the sedation options and                         risks were discussed with the patient. All questions                         were answered and informed consent was obtained.                         Patient identification and proposed procedure were                         verified by the physician, the nurse, the anesthetist                         and the technician in the endoscopy suite. Mental                         Status Examination: alert and oriented. Airway                         Examination: normal oropharyngeal airway and neck                         mobility. Respiratory Examination: clear to                         auscultation. CV Examination: regular rate and rhythm.  Prophylactic Antibiotics: The patient does not require                         prophylactic antibiotics. Prior Anticoagulants: The                         patient  has taken no anticoagulant or antiplatelet                         agents. ASA Grade Assessment: III - A patient with                         severe systemic disease. After reviewing the risks and                         benefits, the patient was deemed in satisfactory                         condition to undergo the procedure. The anesthesia                         plan was to use monitored anesthesia care (MAC).                         Immediately prior to administration of medications,                         the patient was re-assessed for adequacy to receive                         sedatives. The heart rate, respiratory rate, oxygen                         saturations, blood pressure, adequacy of pulmonary                         ventilation, and response to care were monitored                         throughout the procedure. The physical status of the                         patient was re-assessed after the procedure.                        After obtaining informed consent, the colonoscope was                         passed under direct vision. Throughout the procedure,                         the patient's blood pressure, pulse, and oxygen                         saturations were monitored continuously. The                         Colonoscope was introduced through the anus and  advanced to the the cecum, identified by appendiceal                         orifice and ileocecal valve. The colonoscopy was                         performed without difficulty. The patient tolerated                         the procedure well. The quality of the bowel                         preparation was evaluated using the BBPS [facility] Bowel                         Preparation Scale) with scores of: Right Colon = 2                         (minor amount of residual staining, small fragments of                         stool and/or opaque liquid, but mucosa seen well),                          Transverse Colon = 2 (minor amount of residual                         staining, small fragments of stool and/or opaque                         liquid, but mucosa seen well) and Left Colon = 2                         (minor amount of residual staining, small fragments of                         stool and/or opaque liquid, but mucosa seen well). The                         total BBPS score equals 6. The quality of the bowel                         preparation was good. The ileocecal valve, appendiceal                         orifice, and rectum were photographed. Findings:      Hemorrhoids were found on perianal exam.      The digital rectal exam was normal. Pertinent negatives include normal       sphincter tone.      Non-bleeding internal hemorrhoids were found during retroflexion and       during perianal exam. The hemorrhoids were Grade II (internal       hemorrhoids that prolapse but reduce spontaneously). Estimated blood       loss: none.      A 1 to 2 mm polyp was found in the transverse colon. The polyp was       sessile. The  polyp was removed with a jumbo cold forceps. Resection and       retrieval were complete. Estimated blood loss was minimal.      A moderate amount of liquid semi-liquid stool was found in the entire       colon, interfering with visualization. Lavage of the area was performed       using a moderate amount, resulting in clearance with good visualization.       Estimated blood loss: none.      Retroflexion in the right colon was performed.      The exam was otherwise without abnormality on direct and retroflexion       views. Impression:            - Hemorrhoids found on perianal exam.                        - Non-bleeding internal hemorrhoids.                        - One 1 to 2 mm polyp in the transverse colon, removed                         with a jumbo cold forceps. Resected and retrieved.                        - Stool in the entire examined  colon.                        - The examination was otherwise normal on direct and                         retroflexion views. Recommendation:        - Patient has a contact number available for                         emergencies. The signs and symptoms of potential                         delayed complications were discussed with the patient.                         Return to normal activities tomorrow. Written                         discharge instructions were provided to the patient.                        - Discharge patient to home.                        - Resume previous diet.                        - Continue present medications.                        - Await pathology results.                        - Repeat colonoscopy for surveillance based on  pathology results.                        - Return to GI office as previously scheduled.                        - The findings and recommendations were discussed with                         the patient. Procedure Code(s):     --- Professional ---                        [phone], Colonoscopy, flexible; with biopsy, single or                         multiple Diagnosis Code(s):     --- Professional ---                        Z80.0, Family history of malignant neoplasm of                         digestive organs                        D12.3, Benign neoplasm of transverse colon (hepatic                         flexure or splenic flexure)                        K64.1, Second degree hemorrhoids CPT copyright 2022 American Medical Association. All rights reserved. The codes documented in this report are preliminary and upon coder review may  be revised to meet current compliance requirements. Attending Participation:      I personally performed the entire procedure. Volney American, DO Annamaria Helling DO, DO 04/05/2022 11:41:38 AM This report has been signed electronically. Number of Addenda: 0 Note Initiated On:  04/05/2022 10:30 AM Scope Withdrawal Time: 0 hours 16 minutes 53 seconds  Total Procedure Duration: 0 hours 23 minutes 9 seconds  Estimated Blood Loss:  Estimated blood loss was minimal.      [facility]

## 2022-04-05 NOTE — Interval H&P Note (Signed)
History and Physical Interval Note: Preprocedure H&P from 04/05/22  was reviewed and there was no interval change after seeing and examining the patient.  Written consent was obtained from the patient after discussion of risks, benefits, and alternatives. Patient has consented to proceed with Esophagogastroduodenoscopy and Colonoscopy with possible intervention   04/05/2022 10:39 AM  Heather Figueroa  has presented today for surgery, with the diagnosis of Chronic constipation (K59.09) Family history of colon cancer (Z80.0) Gastroesophageal reflux disease, unspecified whether esophagitis present (K21.9) Dysphagia, unspecified type (R13.10).  The various methods of treatment have been discussed with the patient and family. After consideration of risks, benefits and other options for treatment, the patient has consented to  Procedure(s): COLONOSCOPY (N/A) ESOPHAGOGASTRODUODENOSCOPY (EGD) (N/A) as a surgical intervention.  The patient's history has been reviewed, patient examined, no change in status, stable for surgery.  I have reviewed the patient's chart and labs.  Questions were answered to the patient's satisfaction.     Annamaria Helling

## 2022-04-05 NOTE — Anesthesia Postprocedure Evaluation (Signed)
Anesthesia Post Note  Patient: Heather Figueroa  Procedure(s) Performed: COLONOSCOPY ESOPHAGOGASTRODUODENOSCOPY (EGD)  Patient location during evaluation: PACU Anesthesia Type: General Level of consciousness: awake and alert Pain management: pain level controlled Vital Signs Assessment: post-procedure vital signs reviewed and stable Respiratory status: spontaneous breathing, nonlabored ventilation and respiratory function stable Cardiovascular status: blood pressure returned to baseline and stable Postop Assessment: no apparent nausea or vomiting Anesthetic complications: no   There were no known notable events for this encounter.   Last Vitals:  Vitals:   04/05/22 1145 04/05/22 1155  BP: 126/87 111/89  Pulse: 89 87  Resp: 17 17  Temp: (!) 36.1 C   SpO2: 99% 99%    Last Pain:  Vitals:   04/05/22 1155  TempSrc:   PainSc: 0-No pain                 [place] Ouch

## 2022-04-05 NOTE — Op Note (Signed)
[facility] Gastroenterology Patient Name: Heather Figueroa Procedure Date: 04/05/2022 10:30 AM MRN: [id number] Account #: [id number] Date of Birth: 11/15/1970 Admit Type: Outpatient Age: 52 Room: [facility] ENDO ROOM 2 Gender: Female Note Status: Finalized Instrument Name: Upper Endoscope [id number] Procedure:             Upper GI endoscopy Indications:           Dysphagia Providers:             Annamaria Helling DO, DO Medicines:             Monitored Anesthesia Care Complications:         No immediate complications. Estimated blood loss:                         Minimal. Procedure:             Pre-Anesthesia Assessment:                        - Prior to the procedure, a History and Physical was                         performed, and patient medications and allergies were                         reviewed. The patient is competent. The risks and                         benefits of the procedure and the sedation options and                         risks were discussed with the patient. All questions                         were answered and informed consent was obtained.                         Patient identification and proposed procedure were                         verified by the physician, the nurse, the anesthetist                         and the technician in the endoscopy suite. Mental                         Status Examination: alert and oriented. Airway                         Examination: normal oropharyngeal airway and neck                         mobility. Respiratory Examination: clear to                         auscultation. CV Examination: RRR, no murmurs, no S3                         or S4. Prophylactic Antibiotics: The patient does not  require prophylactic antibiotics. Prior                         Anticoagulants: The patient has taken no anticoagulant                         or antiplatelet agents. ASA Grade Assessment: III - A                          patient with severe systemic disease. After reviewing                         the risks and benefits, the patient was deemed in                         satisfactory condition to undergo the procedure. The                         anesthesia plan was to use general anesthesia.                         Immediately prior to administration of medications,                         the patient was re-assessed for adequacy to receive                         sedatives. The heart rate, respiratory rate, oxygen                         saturations, blood pressure, adequacy of pulmonary                         ventilation, and response to care were monitored                         throughout the procedure. The physical status of the                         patient was re-assessed after the procedure.                        After obtaining informed consent, the endoscope was                         passed under direct vision. Throughout the procedure,                         the patient's blood pressure, pulse, and oxygen                         saturations were monitored continuously. The                         Endosonoscope was introduced through the mouth, and                         advanced to the second part of duodenum. The upper GI  endoscopy was accomplished without difficulty. The                         patient tolerated the procedure well. Findings:      The duodenal bulb, first portion of the duodenum and second portion of       the duodenum were normal. Estimated blood loss: none.      Two non-bleeding cratered gastric ulcers with a clean ulcer base       (Forrest Class III) were found in the gastric antrum. The largest lesion       was 5 mm in largest dimension. Biopsies were taken with a cold forceps       for Helicobacter pylori testing. Biopsies were taken with a cold forceps       for histology. Estimated blood loss was minimal.       Diffuse mild inflammation characterized by erythema was found in the       entire examined stomach. Estimated blood loss: none.      The Z-line was regular. Estimated blood loss: none.      Esophagogastric landmarks were identified: the gastroesophageal junction       was found at 36 cm from the incisors. Estimated blood loss: none.      Normal mucosa was found in the entire esophagus. The scope was       withdrawn. Dilation was performed with a Maloney dilator with no       resistance at 52 Fr. The dilation site was examined following endoscope       reinsertion and showed mild mucosal disruption. Estimated blood loss:       none.      The exam of the esophagus was otherwise normal. Impression:            - Normal duodenal bulb, first portion of the duodenum                         and second portion of the duodenum.                        - Non-bleeding gastric ulcers with a clean ulcer base                         (Forrest Class III). Biopsied.                        - Gastritis.                        - Z-line regular.                        - Esophagogastric landmarks identified.                        - Normal mucosa was found in the entire esophagus.                         Dilated. Recommendation:        - Patient has a contact number available for                         emergencies. The signs and symptoms of potential  delayed complications were discussed with the patient.                         Return to normal activities tomorrow. Written                         discharge instructions were provided to the patient.                        - Discharge patient to home.                        - Continue present medications.                        - No aspirin, ibuprofen, naproxen, or other                         non-steroidal anti-inflammatory drugs including Goodys.                        - Increase proton pump inhibitor to twice a day.                         - Soft diet today.                        - Await pathology results.                        - Repeat upper endoscopy PRN for retreatment.                        - Return to GI office as previously scheduled.                        - proceed with colonoscopy                        - The findings and recommendations were discussed with                         the patient. Procedure Code(s):     --- Professional ---                        [phone], Esophagogastroduodenoscopy, flexible,                         transoral; with biopsy, single or multiple                        43450, Dilation of esophagus, by unguided sound or                         bougie, single or multiple passes Diagnosis Code(s):     --- Professional ---                        K25.9, Gastric ulcer, unspecified as acute or chronic,  without hemorrhage or perforation                        K29.70, Gastritis, unspecified, without bleeding                        R13.10, Dysphagia, unspecified CPT copyright 2022 American Medical Association. All rights reserved. The codes documented in this report are preliminary and upon coder review may  be revised to meet current compliance requirements. Attending Participation:      I personally performed the entire procedure. Volney American, DO Annamaria Helling DO, DO 04/05/2022 11:10:43 AM This report has been signed electronically. Number of Addenda: 0 Note Initiated On: 04/05/2022 10:30 AM Estimated Blood Loss:  Estimated blood loss was minimal.      [facility]

## 2022-04-06 ENCOUNTER — Encounter: Payer: Self-pay | Admitting: Gastroenterology

## 2022-04-07 LAB — SURGICAL PATHOLOGY

## 2022-05-21 ENCOUNTER — Encounter (HOSPITAL_COMMUNITY): Payer: Self-pay | Admitting: Emergency Medicine

## 2022-05-21 ENCOUNTER — Emergency Department (HOSPITAL_COMMUNITY)
Admission: EM | Admit: 2022-05-21 | Discharge: 2022-05-22 | Discharge status: Against Medical Advice | Payer: Medicaid Other | Attending: Emergency Medicine | Admitting: Emergency Medicine

## 2022-05-21 DIAGNOSIS — R55 Syncope and collapse: Secondary | ICD-10-CM | POA: Diagnosis not present

## 2022-05-21 DIAGNOSIS — R42 Dizziness and giddiness: Secondary | ICD-10-CM | POA: Diagnosis present

## 2022-05-21 DIAGNOSIS — Z5321 Procedure and treatment not carried out due to patient leaving prior to being seen by health care provider: Secondary | ICD-10-CM | POA: Diagnosis not present

## 2022-05-21 LAB — CBC WITH DIFFERENTIAL/PLATELET
Abs Immature Granulocytes: 0.01 K/uL (ref 0.00–0.07)
Basophils Absolute: 0 K/uL (ref 0.0–0.1)
Basophils Relative: 0 %
Eosinophils Absolute: 0.1 K/uL (ref 0.0–0.5)
Eosinophils Relative: 2 %
HCT: 37.7 % (ref 36.0–46.0)
Hemoglobin: 12.5 g/dL (ref 12.0–15.0)
Immature Granulocytes: 0 %
Lymphocytes Relative: 60 %
Lymphs Abs: 4.2 K/uL — ABNORMAL HIGH (ref 0.7–4.0)
MCH: 25.5 pg — ABNORMAL LOW (ref 26.0–34.0)
MCHC: 33.2 g/dL (ref 30.0–36.0)
MCV: 76.9 fL — ABNORMAL LOW (ref 80.0–100.0)
Monocytes Absolute: 0.4 K/uL (ref 0.1–1.0)
Monocytes Relative: 6 %
Neutro Abs: 2.3 K/uL (ref 1.7–7.7)
Neutrophils Relative %: 32 %
Platelets: 211 K/uL (ref 150–400)
RBC: 4.9 MIL/uL (ref 3.87–5.11)
RDW: 14.9 % (ref 11.5–15.5)
WBC: 7.2 K/uL (ref 4.0–10.5)
nRBC: 0 % (ref 0.0–0.2)

## 2022-05-21 LAB — CBG MONITORING, ED: Glucose-Capillary: 96 mg/dL (ref 70–99)

## 2022-05-21 LAB — BASIC METABOLIC PANEL WITH GFR
Anion gap: 12 (ref 5–15)
BUN: 9 mg/dL (ref 6–20)
CO2: 23 mmol/L (ref 22–32)
Calcium: 9.2 mg/dL (ref 8.9–10.3)
Chloride: 105 mmol/L (ref 98–111)
Creatinine, Ser: 0.71 mg/dL (ref 0.44–1.00)
GFR, Estimated: >60 mL/min
Glucose, Bld: 100 mg/dL — ABNORMAL HIGH (ref 70–99)
Potassium: 3.7 mmol/L (ref 3.5–5.1)
Sodium: 140 mmol/L (ref 135–145)

## 2022-05-21 LAB — TROPONIN I (HIGH SENSITIVITY): Troponin I (High Sensitivity): 3 ng/L

## 2022-05-21 NOTE — ED Triage Notes (Signed)
Pt at home started feeling dizzy lightheaded for about 20 mins. Denies SOB, chest pain, racing heart. Took CBG at home and was 497. Called provider and said they should come here. She has been drinking some water. CBG reads 96 here.

## 2022-05-21 NOTE — ED Provider Triage Note (Signed)
Emergency Medicine Provider Triage Evaluation Note  Heather Figueroa , a 52 y.o. female  was evaluated in triage.  Pt complains of near syncope.  Patient states that she was feeling Easter baskets when she suddenly felt dizzy and lightheaded.  She denies feeling she was in a pass out and denies vertigo.  She has a history of seizures she is on Lamictal and Seroquel.  Patient denies racing or skipping in her heart, chest pain shortness of breath.  Her daughter took her blood sugar and has had 596 however it is 96 here.  Review of Systems  Positive: Lightheadedness Negative: Spinning  Physical Exam  BP (!) 164/105   Pulse 78   Temp 98.1 F (36.7 C) (Oral)   Resp 18   SpO2 98%  Gen:   Awake, no distress   Resp:  Normal effort  MSK:   Moves extremities without difficulty  Other:    Medical Decision Making  Medically screening exam initiated at 9:03 PM.  Appropriate orders placed.  Heather Figueroa was informed that the remainder of the evaluation will be completed by another provider, this initial triage assessment does not replace that evaluation, and the importance of remaining in the ED until their evaluation is complete.     Margarita Mail, PA-C 05/21/22 2105

## 2022-05-22 LAB — TROPONIN I (HIGH SENSITIVITY): Troponin I (High Sensitivity): 4 ng/L (ref <18)

## 2022-05-22 NOTE — ED Notes (Signed)
Pt called for room, no response. Seen by registration and other pts leaving

## 2022-06-10 ENCOUNTER — Other Ambulatory Visit: Payer: Self-pay

## 2022-06-10 ENCOUNTER — Encounter (HOSPITAL_COMMUNITY): Payer: Self-pay

## 2022-06-10 ENCOUNTER — Emergency Department (HOSPITAL_COMMUNITY)
Admission: EM | Admit: 2022-06-10 | Discharge: 2022-06-10 | Discharge status: Against Medical Advice | Payer: Medicaid Other | Attending: Emergency Medicine | Admitting: Emergency Medicine

## 2022-06-10 DIAGNOSIS — R109 Unspecified abdominal pain: Secondary | ICD-10-CM | POA: Insufficient documentation

## 2022-06-10 DIAGNOSIS — Z5321 Procedure and treatment not carried out due to patient leaving prior to being seen by health care provider: Secondary | ICD-10-CM | POA: Diagnosis not present

## 2022-06-10 LAB — COMPREHENSIVE METABOLIC PANEL
ALT: 20 U/L (ref 0–44)
AST: 19 U/L (ref 15–41)
Albumin: 4.2 g/dL (ref 3.5–5.0)
Alkaline Phosphatase: 113 U/L (ref 38–126)
Anion gap: 9 (ref 5–15)
BUN: 11 mg/dL (ref 6–20)
CO2: 24 mmol/L (ref 22–32)
Calcium: 9.5 mg/dL (ref 8.9–10.3)
Chloride: 106 mmol/L (ref 98–111)
Creatinine, Ser: 0.78 mg/dL (ref 0.44–1.00)
GFR, Estimated: >60 mL/min (ref >60)
Glucose, Bld: 104 mg/dL — ABNORMAL HIGH (ref 70–99)
Potassium: 4.2 mmol/L (ref 3.5–5.1)
Sodium: 139 mmol/L (ref 135–145)
Total Bilirubin: 0.4 mg/dL (ref 0.3–1.2)
Total Protein: 7.2 g/dL (ref 6.5–8.1)

## 2022-06-10 LAB — CBC WITH DIFFERENTIAL/PLATELET
Abs Immature Granulocytes: 0.01 10*3/uL (ref 0.00–0.07)
Basophils Absolute: 0 10*3/uL (ref 0.0–0.1)
Basophils Relative: 0 %
Eosinophils Absolute: 0.1 10*3/uL (ref 0.0–0.5)
Eosinophils Relative: 2 %
HCT: 43.2 % (ref 36.0–46.0)
Hemoglobin: 13.7 g/dL (ref 12.0–15.0)
Immature Granulocytes: 0 %
Lymphocytes Relative: 54 %
Lymphs Abs: 3.7 10*3/uL (ref 0.7–4.0)
MCH: 25.1 pg — ABNORMAL LOW (ref 26.0–34.0)
MCHC: 31.7 g/dL (ref 30.0–36.0)
MCV: 79.3 fL — ABNORMAL LOW (ref 80.0–100.0)
Monocytes Absolute: 0.5 10*3/uL (ref 0.1–1.0)
Monocytes Relative: 7 %
Neutro Abs: 2.5 10*3/uL (ref 1.7–7.7)
Neutrophils Relative %: 37 %
Platelets: 273 10*3/uL (ref 150–400)
RBC: 5.45 MIL/uL — ABNORMAL HIGH (ref 3.87–5.11)
RDW: 15 % (ref 11.5–15.5)
WBC: 6.9 10*3/uL (ref 4.0–10.5)
nRBC: 0 % (ref 0.0–0.2)

## 2022-06-10 LAB — URINALYSIS, ROUTINE W REFLEX MICROSCOPIC
Bacteria, UA: NONE SEEN
Bilirubin Urine: NEGATIVE
Glucose, UA: NEGATIVE mg/dL
Ketones, ur: NEGATIVE mg/dL
Leukocytes,Ua: NEGATIVE
Nitrite: NEGATIVE
Protein, ur: 30 mg/dL — AB
Specific Gravity, Urine: 1.025 (ref 1.005–1.030)
pH: 5 (ref 5.0–8.0)

## 2022-06-10 LAB — LIPASE, BLOOD: Lipase: 33 U/L (ref 11–51)

## 2022-06-10 LAB — I-STAT BETA HCG BLOOD, ED (MC, WL, AP ONLY): I-stat hCG, quantitative: <5 m[IU]/mL (ref <5)

## 2022-06-10 NOTE — ED Notes (Signed)
Pt called for in lobby to be taken to CT 3 times with no response. Moved to [facility]

## 2022-06-10 NOTE — ED Triage Notes (Signed)
Pt arrived POV from home c/o generalized abdominal pain that started yesterday and has gotten worse. Pt states she does have an ulcer and is not sure if it is inflamed but she cannot take the pain.

## 2022-06-10 NOTE — ED Provider Triage Note (Signed)
Emergency Medicine Provider Triage Evaluation Note  Heather Figueroa , a 52 y.o. female  was evaluated in triage.  Pt complains of abdominal pain.  Started yesterday and is located in epigastric region.  It is nonradiating.  It feels sharp.  Endorses nausea but no vomiting or diarrhea.  Last bowel movement this morning.  Had an upper endoscopy in February which revealed she did have a ulcer in her stomach per patient.  Also had an ulcer found during her colonoscopy as well which was also February.  Denies fever.  Review of Systems  Positive: See above Negative: See above  Physical Exam  BP (!) 141/93 (BP Location: Right Arm)   Pulse 86   Temp 98.5 F (36.9 C) (Oral)   Resp 17   Ht  (1.549 m)   Wt 75.3 kg   SpO2 100%   BMI 31.37 kg/m  Gen:   Awake, no distress   Resp:  Normal effort  MSK:   Moves extremities without difficulty  Other:    Medical Decision Making  Medically screening exam initiated at 12:15 PM.  Appropriate orders placed.  Heather Figueroa was informed that the remainder of the evaluation will be completed by another provider, this initial triage assessment does not replace that evaluation, and the importance of remaining in the ED until their evaluation is complete.  Work up started   Heather Eagle, PA-C 06/10/22 1216

## 2023-02-28 ENCOUNTER — Ambulatory Visit: Payer: Self-pay | Admitting: Cardiology

## 2023-04-23 IMAGING — MR MR CERVICAL SPINE W/O CM
6 series · 30 of 48 positions shown · non-contrast
Comparison: None available.

CLINICAL DATA: Cervical radiculopathy

EXAM:
MRI CERVICAL SPINE WITHOUT CONTRAST
TECHNIQUE: Multiplanar, multisequence MR imaging of the cervical spine was
performed. No intravenous contrast was administered.

[Series 5: T2 · sagittal · 3.0mm · 0.69mm/px · 5 of 15 slices shown]
[im 1/15]
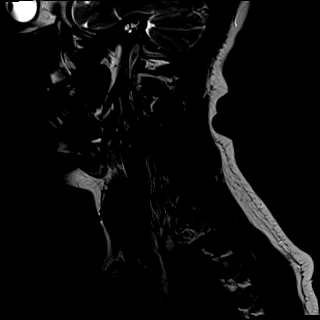
[im 4/15]
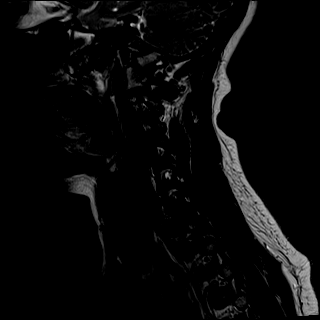
[im 8/15]
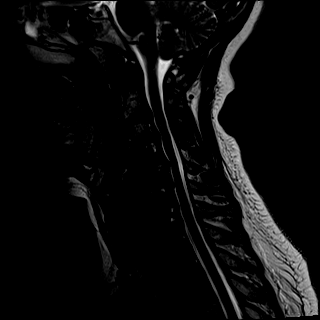
[im 11/15]
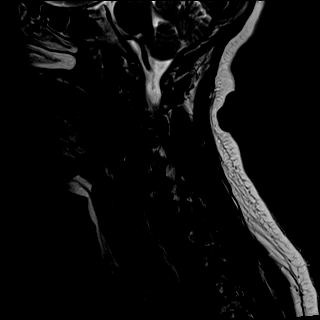
[im 15/15]
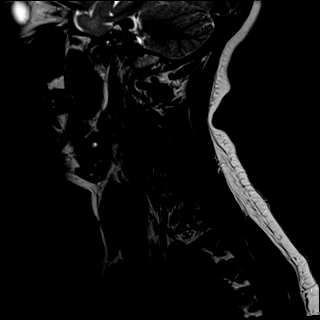

[Series 6: t1_tse_warp_sag · sagittal · 3.0mm · 0.45mm/px · 5 of 15 slices shown]
[im 1/15]
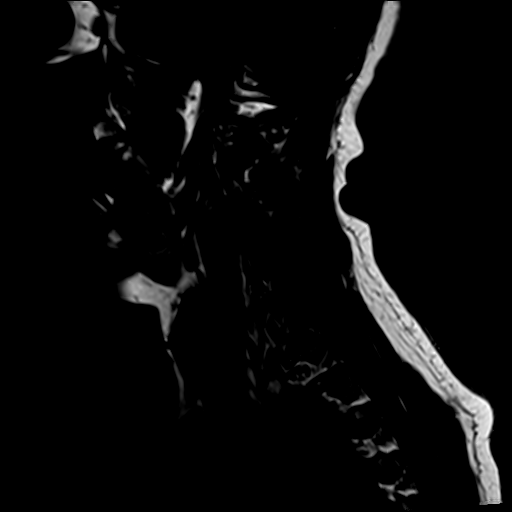
[im 4/15]
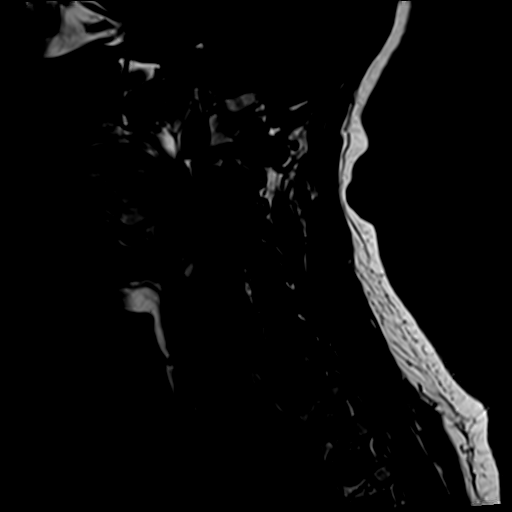
[im 8/15]
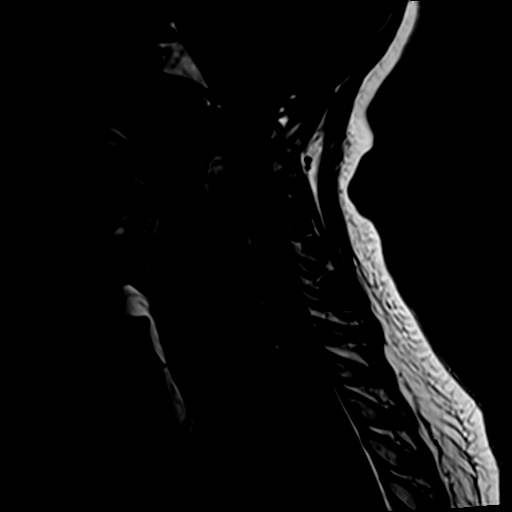
[im 11/15]
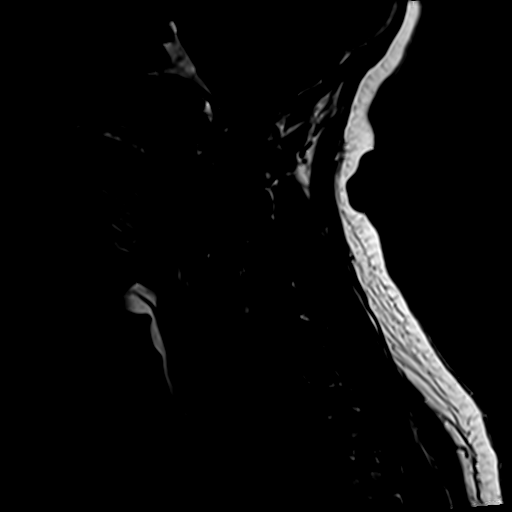
[im 15/15]
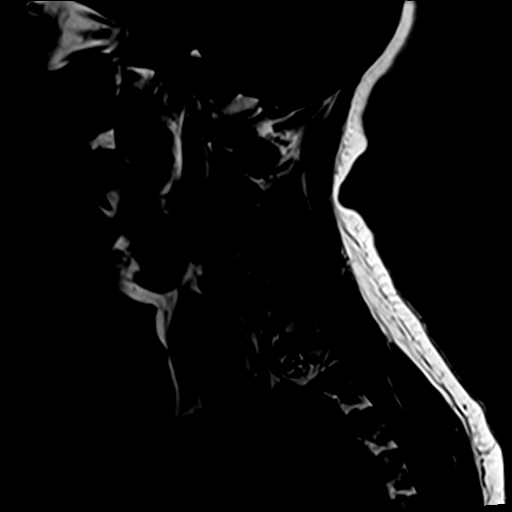

[Series 7: t2_tse_stir_warp_sag · sagittal · 3.0mm · 0.90mm/px · 5 of 15 slices shown]
[im 1/15]
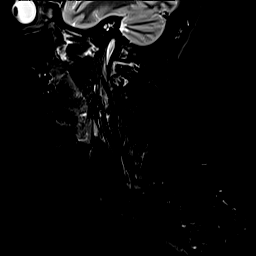
[im 4/15]
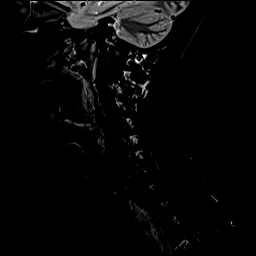
[im 8/15]
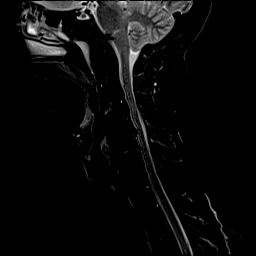
[im 11/15]
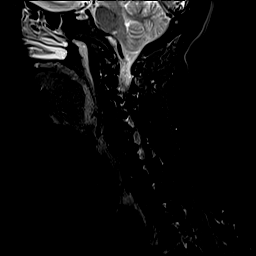
[im 15/15]
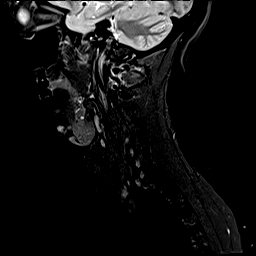

[Series 8: t2_tse_warp_sag · sagittal · 3.0mm · 0.45mm/px · 5 of 15 slices shown]
[im 1/15]
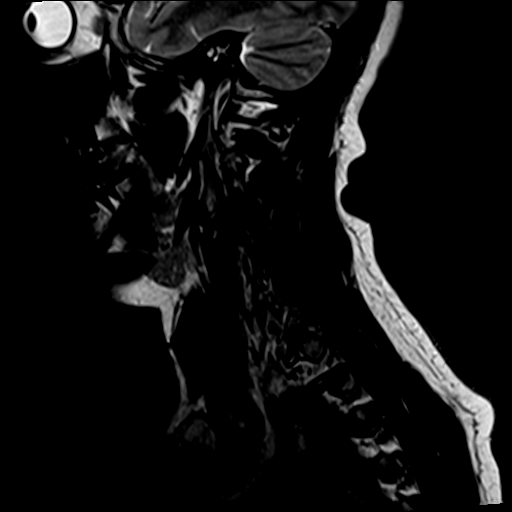
[im 4/15]
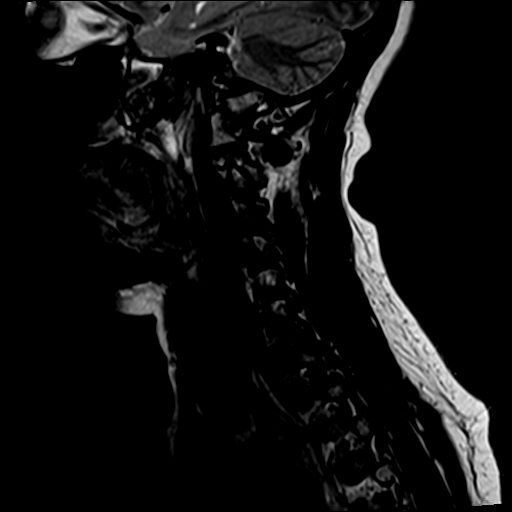
[im 8/15]
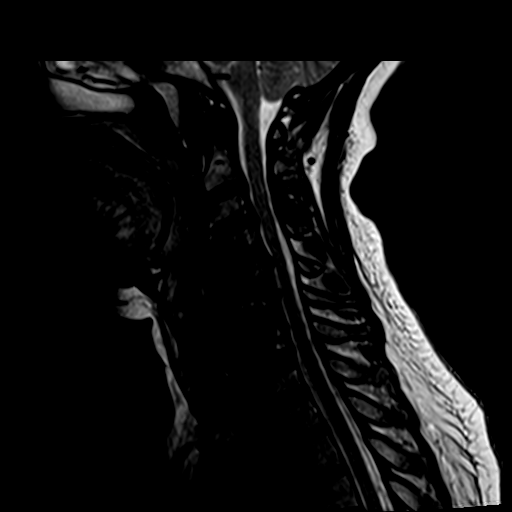
[im 11/15]
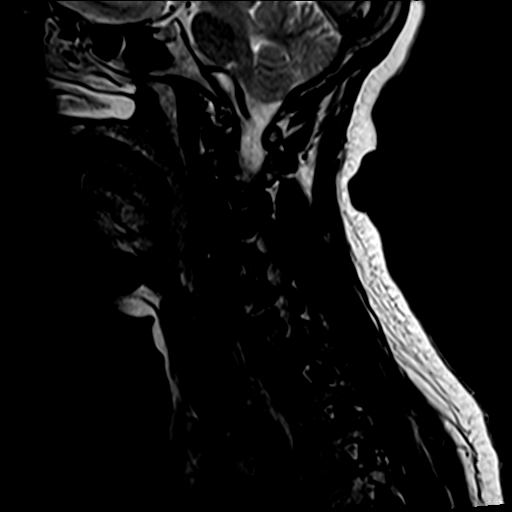
[im 15/15]
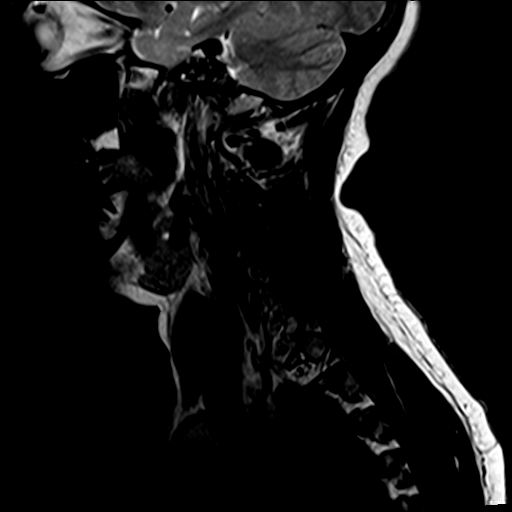

[Series 9: t2_tse_warp_tra · axial · 3.0mm · 0.78mm/px · z∈[-114,+9]mm · 8 of 40 slices shown]
[im 1/40]
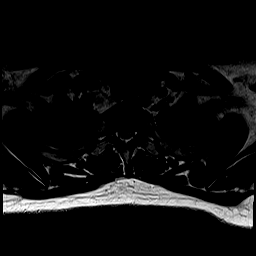
[im 7/40]
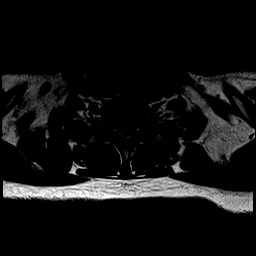
[im 13/40]
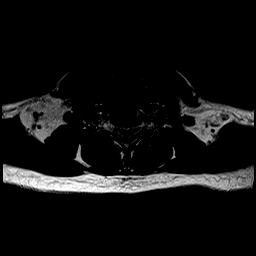
[im 19/40]
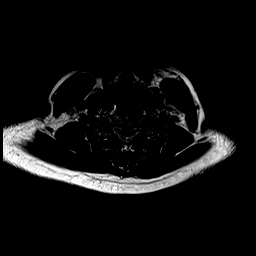
[im 22/40]
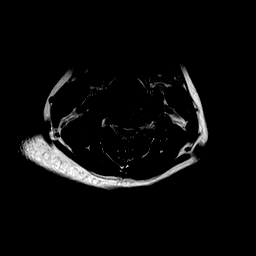
[im 28/40]
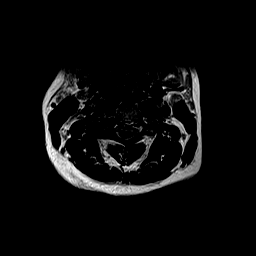
[im 34/40]
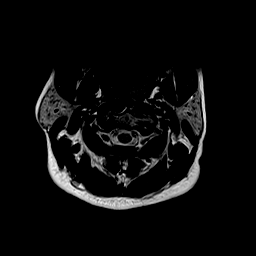
[im 40/40]
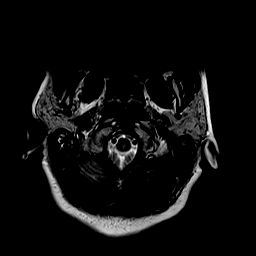

[Series 10: t1_tse_warp_tra · axial · 3.0mm · 0.39mm/px · z∈[-114,-95]mm · 2 of 40 slices shown]
[im 1/40]
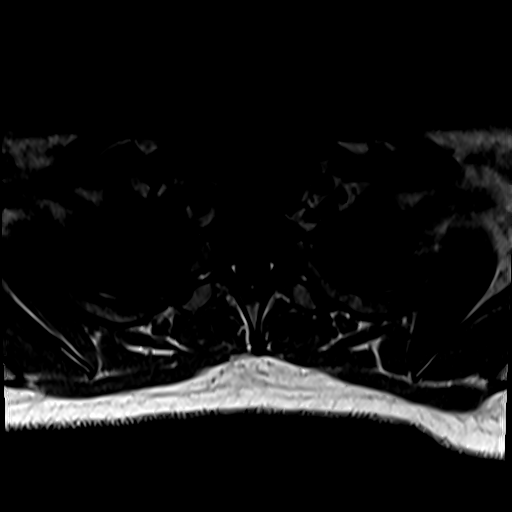
[im 7/40]
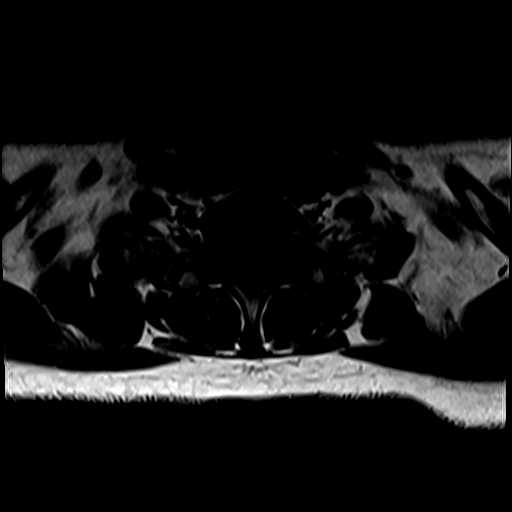

[30 of 48 positions shown; findings below may reference images not displayed]

Correlation is made with CT cervical
spine 01/08/2010 and cervical spine radiographs 03/01/2017
FINDINGS: Alignment: Straightening of the normal cervical lordosis. No
listhesis.

Vertebrae: Status post ACDF C4-C7, with associated susceptibility
artifact. No evidence of acute fracture or suspicious osseous
lesion. Congenitally short pedicles, which narrow the AP diameter of
the spinal canal.

Cord: Bilateral, symmetric, circular foci of T2 hyperintense signal
in the anterior horn cells of the spinal cord at C5 (series 9,
images 20 3-25). Otherwise normal in signal. Normal in caliber with
cord flattening C4-C6.

Posterior Fossa, vertebral arteries, paraspinal tissues: Negative.

Disc levels:

C2-C3: Mild disc bulge. Right-greater-than-left facet arthropathy.
No spinal canal stenosis. Mild right neural foraminal narrowing.

C3-C4: Broad-based disc bulge. Facet and uncovertebral hypertrophy.
Moderate to severe spinal canal stenosis. Mild bilateral neural
foraminal narrowing.

C4-C5: Status post ACDF. No spinal canal stenosis or neural
foraminal narrowing.

C5-C6: Status post ACDF, with midline osseous hypertrophy posterior
to the C5 vertebral body, which indents and deforms the ventral cord
(series 9, image 22). At the disc level, there is no spinal canal
stenosis or neural foraminal narrowing.

C6-C7: Status post ACDF. Smaller midline osseous hypertrophy
posterior to the C6 vertebral body. No spinal canal stenosis or
neural foraminal narrowing.

C7-T1: Mild disc bulge. Uncovertebral and facet arthropathy. No
spinal canal stenosis. Mild right neural foraminal narrowing.
IMPRESSION: 1. Status post ACDF C4-C7, with no residual spinal canal stenosis or
neural foraminal narrowing at these levels. Posterior to the C5 and
C6 vertebral bodies, there is osseous hypertrophy, possibly
ossification of the posterior longitudinal ligament, that indents
the ventral cord, with bilaterally symmetric increased T2 signal in
the anterior horn cells of the spinal cord at the level of C5 ( "owl
eyes sign"), which can be seen in the setting of chronic compressive
myelopathy.
2. C3-C4 moderate to severe spinal canal stenosis and mild bilateral
neural foraminal narrowing.
3. C2-C3 and C7-T1 mild right neural foraminal narrowing.

## 2023-05-23 ENCOUNTER — Emergency Department (HOSPITAL_COMMUNITY): Payer: MEDICAID

## 2023-05-23 ENCOUNTER — Other Ambulatory Visit: Payer: Self-pay

## 2023-05-23 ENCOUNTER — Encounter (HOSPITAL_COMMUNITY): Payer: Self-pay

## 2023-05-23 ENCOUNTER — Inpatient Hospital Stay (HOSPITAL_COMMUNITY)
Admission: EM | Admit: 2023-05-23 | Discharge: 2023-05-26 | DRG: 101 | Disposition: A | Discharge status: Home / Self Care | Payer: MEDICAID | Attending: Internal Medicine | Admitting: Internal Medicine

## 2023-05-23 DIAGNOSIS — F4310 Post-traumatic stress disorder, unspecified: Secondary | ICD-10-CM | POA: Diagnosis present

## 2023-05-23 DIAGNOSIS — I10 Essential (primary) hypertension: Secondary | ICD-10-CM | POA: Diagnosis present

## 2023-05-23 DIAGNOSIS — Z8042 Family history of malignant neoplasm of prostate: Secondary | ICD-10-CM

## 2023-05-23 DIAGNOSIS — Z7984 Long term (current) use of oral hypoglycemic drugs: Secondary | ICD-10-CM

## 2023-05-23 DIAGNOSIS — Z90710 Acquired absence of both cervix and uterus: Secondary | ICD-10-CM

## 2023-05-23 DIAGNOSIS — F419 Anxiety disorder, unspecified: Secondary | ICD-10-CM | POA: Diagnosis present

## 2023-05-23 DIAGNOSIS — R0789 Other chest pain: Secondary | ICD-10-CM | POA: Insufficient documentation

## 2023-05-23 DIAGNOSIS — F3130 Bipolar disorder, current episode depressed, mild or moderate severity, unspecified: Secondary | ICD-10-CM | POA: Diagnosis present

## 2023-05-23 DIAGNOSIS — Z9151 Personal history of suicidal behavior: Secondary | ICD-10-CM

## 2023-05-23 DIAGNOSIS — Z8249 Family history of ischemic heart disease and other diseases of the circulatory system: Secondary | ICD-10-CM

## 2023-05-23 DIAGNOSIS — Z886 Allergy status to analgesic agent status: Secondary | ICD-10-CM

## 2023-05-23 DIAGNOSIS — Z981 Arthrodesis status: Secondary | ICD-10-CM

## 2023-05-23 DIAGNOSIS — E1159 Type 2 diabetes mellitus with other circulatory complications: Secondary | ICD-10-CM

## 2023-05-23 DIAGNOSIS — R569 Unspecified convulsions: Principal | ICD-10-CM

## 2023-05-23 DIAGNOSIS — K5909 Other constipation: Secondary | ICD-10-CM | POA: Diagnosis present

## 2023-05-23 DIAGNOSIS — F17210 Nicotine dependence, cigarettes, uncomplicated: Secondary | ICD-10-CM | POA: Diagnosis present

## 2023-05-23 DIAGNOSIS — M1990 Unspecified osteoarthritis, unspecified site: Secondary | ICD-10-CM | POA: Diagnosis present

## 2023-05-23 DIAGNOSIS — R29810 Facial weakness: Secondary | ICD-10-CM | POA: Diagnosis present

## 2023-05-23 DIAGNOSIS — Z79899 Other long term (current) drug therapy: Secondary | ICD-10-CM

## 2023-05-23 DIAGNOSIS — Z888 Allergy status to other drugs, medicaments and biological substances status: Secondary | ICD-10-CM

## 2023-05-23 DIAGNOSIS — K219 Gastro-esophageal reflux disease without esophagitis: Secondary | ICD-10-CM | POA: Diagnosis present

## 2023-05-23 DIAGNOSIS — F32A Depression, unspecified: Secondary | ICD-10-CM

## 2023-05-23 DIAGNOSIS — Z800 Family history of malignant neoplasm of digestive organs: Secondary | ICD-10-CM

## 2023-05-23 DIAGNOSIS — Z885 Allergy status to narcotic agent status: Secondary | ICD-10-CM

## 2023-05-23 DIAGNOSIS — R079 Chest pain, unspecified: Secondary | ICD-10-CM | POA: Insufficient documentation

## 2023-05-23 DIAGNOSIS — G40909 Epilepsy, unspecified, not intractable, without status epilepticus: Principal | ICD-10-CM | POA: Diagnosis present

## 2023-05-23 DIAGNOSIS — Z8673 Personal history of transient ischemic attack (TIA), and cerebral infarction without residual deficits: Secondary | ICD-10-CM

## 2023-05-23 DIAGNOSIS — R7303 Prediabetes: Secondary | ICD-10-CM

## 2023-05-23 DIAGNOSIS — Z8052 Family history of malignant neoplasm of bladder: Secondary | ICD-10-CM

## 2023-05-23 DIAGNOSIS — R531 Weakness: Secondary | ICD-10-CM

## 2023-05-23 DIAGNOSIS — R4701 Aphasia: Secondary | ICD-10-CM | POA: Diagnosis present

## 2023-05-23 LAB — URINALYSIS, ROUTINE W REFLEX MICROSCOPIC
Bilirubin Urine: NEGATIVE
Glucose, UA: NEGATIVE mg/dL
Hgb urine dipstick: NEGATIVE
Ketones, ur: NEGATIVE mg/dL
Leukocytes,Ua: NEGATIVE
Nitrite: NEGATIVE
Protein, ur: NEGATIVE mg/dL
Specific Gravity, Urine: 1.03 (ref 1.005–1.030)
pH: 6 (ref 5.0–8.0)

## 2023-05-23 LAB — COMPREHENSIVE METABOLIC PANEL WITH GFR
ALT: 16 U/L (ref 0–44)
AST: 19 U/L (ref 15–41)
Albumin: 4.1 g/dL (ref 3.5–5.0)
Alkaline Phosphatase: 102 U/L (ref 38–126)
Anion gap: 9 (ref 5–15)
BUN: 13 mg/dL (ref 6–20)
CO2: 24 mmol/L (ref 22–32)
Calcium: 9.7 mg/dL (ref 8.9–10.3)
Chloride: 109 mmol/L (ref 98–111)
Creatinine, Ser: 0.8 mg/dL (ref 0.44–1.00)
GFR, Estimated: >60 mL/min (ref >60)
Glucose, Bld: 96 mg/dL (ref 70–99)
Potassium: 3.6 mmol/L (ref 3.5–5.1)
Sodium: 142 mmol/L (ref 135–145)
Total Bilirubin: 0.3 mg/dL (ref 0.0–1.2)
Total Protein: 7 g/dL (ref 6.5–8.1)

## 2023-05-23 LAB — CBC WITH DIFFERENTIAL/PLATELET
Abs Immature Granulocytes: 0.01 10*3/uL (ref 0.00–0.07)
Basophils Absolute: 0 10*3/uL (ref 0.0–0.1)
Basophils Relative: 0 %
Eosinophils Absolute: 0.1 10*3/uL (ref 0.0–0.5)
Eosinophils Relative: 2 %
HCT: 39.8 % (ref 36.0–46.0)
Hemoglobin: 12.6 g/dL (ref 12.0–15.0)
Immature Granulocytes: 0 %
Lymphocytes Relative: 59 %
Lymphs Abs: 3.3 10*3/uL (ref 0.7–4.0)
MCH: 25 pg — ABNORMAL LOW (ref 26.0–34.0)
MCHC: 31.7 g/dL (ref 30.0–36.0)
MCV: 78.8 fL — ABNORMAL LOW (ref 80.0–100.0)
Monocytes Absolute: 0.2 10*3/uL (ref 0.1–1.0)
Monocytes Relative: 4 %
Neutro Abs: 2 10*3/uL (ref 1.7–7.7)
Neutrophils Relative %: 35 %
Platelets: 223 10*3/uL (ref 150–400)
RBC: 5.05 MIL/uL (ref 3.87–5.11)
RDW: 15.5 % (ref 11.5–15.5)
WBC: 5.7 10*3/uL (ref 4.0–10.5)
nRBC: 0 % (ref 0.0–0.2)

## 2023-05-23 LAB — CBG MONITORING, ED: Glucose-Capillary: 92 mg/dL (ref 70–99)

## 2023-05-23 LAB — LACTIC ACID, PLASMA
Lactic Acid, Venous: 0.6 mmol/L (ref 0.5–1.9)
Lactic Acid, Venous: 0.7 mmol/L (ref 0.5–1.9)

## 2023-05-23 LAB — APTT: aPTT: 24 s (ref 24–36)

## 2023-05-23 LAB — TROPONIN I (HIGH SENSITIVITY)
Troponin I (High Sensitivity): 2 ng/L (ref <18)
Troponin I (High Sensitivity): 3 ng/L (ref <18)

## 2023-05-23 LAB — PROTIME-INR
INR: 1.1 (ref 0.8–1.2)
Prothrombin Time: 13.9 s (ref 11.4–15.2)

## 2023-05-23 LAB — HCG, SERUM, QUALITATIVE: Preg, Serum: NEGATIVE

## 2023-05-23 LAB — ETHANOL: Alcohol, Ethyl (B): <10 mg/dL (ref <10)

## 2023-05-23 MED ORDER — IOHEXOL 350 MG/ML SOLN
75.0000 mL | Freq: Once | INTRAVENOUS | Status: AC | PRN
Start: 2023-05-23 — End: 2023-05-23
  Administered 2023-05-23: 75 mL via INTRAVENOUS

## 2023-05-23 MED ORDER — HYDROMORPHONE HCL 1 MG/ML IJ SOLN
0.5000 mg | Freq: Once | INTRAMUSCULAR | Status: AC
  Administered 2023-05-23: 0.5 mg via INTRAVENOUS
  Filled 2023-05-23: qty 1

## 2023-05-23 MED ORDER — LEVETIRACETAM IN NACL 1500 MG/100ML IV SOLN
1500.0000 mg | Freq: Once | INTRAVENOUS | Status: AC
  Administered 2023-05-23: 1500 mg via INTRAVENOUS
  Filled 2023-05-23: qty 100

## 2023-05-23 NOTE — ED Notes (Signed)
 Patient transported to CT

## 2023-05-23 NOTE — ED Triage Notes (Addendum)
 Pt to ED via [facility] from work. Pt was c/o chest pain earlier and then her body tensed up and whole body shaking which was witnessed by FD. Pt was placed on floor, no fall. Per EMS pt was post-ictal and was unable to communicate appropriately. Pt currently holding hands up in folded position, c/o chest pain and shortness of breath. Pt A&Ox4, difficulty finding words, and bilateral lower weakness noted. LKW 1300  170/110 HR 82 100%

## 2023-05-23 NOTE — Plan of Care (Signed)
 Called by the ED physician regarding this patient who has a history of functional neurological disorder with gait impairment, history of seizures coming in with concern for seizure.  Still continues to be weak on right upper extremity and left lower extremity with left facial weakness.  Is not really talking but is able to sign and talk and then at some point is able to say some words out.  Very strongly suspicious for a behavioral episode. CT head, MRI of the brain-negative for acute process. I would recommend an EEG and if abnormal, reach out back to [place] for a formal consultation, otherwise this is a functional neurological disorder that needs management by psychiatry and does not need neurological inpatient consultation. She is currently on Lamictal and over the phone daytime neurologist recommended adding Keppra which I am not averse to.  This will need readdressing by outpatient neurology-she has not followed with outpatient neurology since 2019 so may need a new referral.  -- Milon Dikes, MD Neurologist [facility]

## 2023-05-23 NOTE — ED Provider Notes (Signed)
 [facility] Provider Note   CSN: [id number] Arrival date & time: 05/23/23  1334     History  Chief Complaint  Patient presents with   Seizures    Heather Figueroa is a 53 y.o. female.  With a history of seizure disorder on Lamictal who presents to the ED after seizure.  Patient reportedly had a seizure while at work today around 1300.  EMS noted her to be postictal upon arrival she had difficulty moving her lower extremities and speaking.  Here in the ED the lower extremity weakness is improving but she is still having trouble getting her words out.  She does note chest pain currently as well.  Reports compliance with her Lamictal.  Patient has had similar episodes of postictal periods with difficulty speaking in the past.  Denies drug and alcohol use.  No recent illness.   Seizures      Home Medications Prior to Admission medications   Medication Sig Start Date End Date Taking? Authorizing Provider  amLODipine (NORVASC) 5 MG tablet Take 1 tablet by mouth daily at 12 noon. 10/28/21 10/28/22  [provider]  cholecalciferol (VITAMIN D3) 25 MCG (1000 UNIT) tablet Take 1,000 Units by mouth daily.    [provider]  lamoTRIgine (LAMICTAL) 100 MG tablet Take 100 mg by mouth daily.    [provider]  linaclotide (LINZESS) 72 MCG capsule Take 72 mcg by mouth daily before breakfast.    [provider]  metFORMIN (GLUCOPHAGE) 500 MG tablet Take 2 tablets by mouth daily at 12 noon.    [provider]  metoprolol succinate (TOPROL XL) 25 MG 24 hr tablet Take 1 tablet (25 mg total) by mouth daily at 10 pm. 07/17/21 10/30/21  Tolia, Sunit, DO  nicotine (NICODERM CQ - DOSED IN MG/24 HR) 7 mg/24hr patch Place 1 patch (7 mg total) onto the skin daily. 02/25/22   Tolia, Sunit, DO  nicotine polacrilex (NICOTINE MINI) 4 MG lozenge Take 1 lozenge (4 mg total) by mouth as needed for smoking cessation. 02/25/22   Tolia,  Sunit, DO  olmesartan-hydrochlorothiazide (BENICAR HCT) 40-25 MG tablet Take 1 tablet by mouth daily. 02/25/22 05/26/22  Tolia, Sunit, DO  ondansetron (ZOFRAN-ODT) 4 MG disintegrating tablet Take 4 mg by mouth every 8 (eight) hours as needed. 05/11/21   [provider]  pantoprazole (PROTONIX) 20 MG tablet Take 20 mg by mouth every morning. 05/11/21   [provider]  prazosin (MINIPRESS) 5 MG capsule Take 5 mg by mouth at bedtime.    [provider]  QUEtiapine (SEROQUEL) 300 MG tablet Take 300 mg by mouth at bedtime.    [provider]  rizatriptan (MAXALT) 10 MG tablet Take 10 mg by mouth as needed for migraine.     [provider]      Allergies    Pentazocine, Percocet [oxycodone-acetaminophen], and Tylenol [acetaminophen]    Review of Systems   Review of Systems  Neurological:  Positive for seizures.    Physical Exam Updated Vital Signs BP (!) 145/85   Pulse 65   Temp 97.6 F (36.4 C) (Oral)   Resp (!) 24   Ht 5\' 1"  (1.549 m)   Wt 75 kg   SpO2 100%   BMI 31.24 kg/m  Physical Exam Vitals and nursing note reviewed.  HENT:     Head: Normocephalic and atraumatic.  Eyes:     Pupils: Pupils are equal, round, and reactive to light.  Cardiovascular:     Rate and Rhythm: Normal rate and regular rhythm.  Pulmonary:     Effort: Pulmonary effort is normal.     Breath sounds: Normal breath sounds.  Abdominal:     Palpations: Abdomen is soft.     Tenderness: There is no abdominal tenderness.  Skin:    General: Skin is warm and dry.  Neurological:     General: No focal deficit present.     Mental Status: She is alert and oriented to person, place, and time.     Sensory: No sensory deficit.     Motor: No weakness.     Comments: Slow production of speech Able to name objects Visual fields intact No pronator drift  Psychiatric:        Mood and Affect: Mood normal.     ED Results / Procedures / Treatments   Labs (all labs ordered  are listed, but only abnormal results are displayed) Labs Reviewed  CBC WITH DIFFERENTIAL/PLATELET - Abnormal; Notable for the following components:      Result Value   MCV 78.8 (*)    MCH 25.0 (*)    All other components within normal limits  COMPREHENSIVE METABOLIC PANEL WITH GFR  HCG, SERUM, QUALITATIVE  LACTIC ACID, PLASMA  ETHANOL  URINALYSIS, ROUTINE W REFLEX MICROSCOPIC  LACTIC ACID, PLASMA  APTT  PROTIME-INR  CBG MONITORING, ED  TROPONIN I (HIGH SENSITIVITY)  TROPONIN I (HIGH SENSITIVITY)    EKG EKG Interpretation Date/Time:  Monday May 23 2023 13:48:20 EDT Ventricular Rate:  68 PR Interval:  217 QRS Duration:  88 QT Interval:  373 QTC Calculation: 397 R Axis:   47  Text Interpretation: Sinus rhythm Prolonged PR interval Probable left atrial enlargement Anteroseptal infarct, age indeterminate Nonspecific ST abnormality in inferior leads Confirmed by Vonita Moss [phone]) on 05/23/2023 3:54:45 PM  Radiology DG Chest Portable 1 View Result Date: 05/23/2023 CLINICAL DATA:  Chest pain and tightness. EXAM: PORTABLE CHEST 1 VIEW COMPARISON:  CT 06/18/2021.  Radiographs 06/18/2021 and 08/14/2018. FINDINGS: 1421 hours. The heart size and mediastinal contours are stable. Lower lung volumes with mild resulting bibasilar atelectasis. No edema, confluent airspace disease, pleural effusion or pneumothorax. No acute osseous findings are evident status post cervical fusion. Telemetry leads overlie the chest. IMPRESSION: Lower lung volumes with mild bibasilar atelectasis. No acute cardiopulmonary process. Electronically Signed   By: Carey Bullocks M.D.   On: 05/23/2023 15:41    Procedures Procedures    Medications Ordered in ED Medications  levETIRAcetam (KEPPRA) IVPB 1500 mg/ 100 mL premix (0 mg Intravenous Stopped 05/23/23 1544)    ED Course/ Medical Decision Making/ A&P Clinical Course as of 05/23/23 1555  Mon May 23, 2023  1440 Discussed with Dr.Dessie (neurology).   We will not activate for stroke at this time given improving symptoms and likely postictal..  He will evaluate her shortly here in the ED [MP]  1455 I, Estelle June DO, am transitioning care of this patient to the oncoming provider pending remainder of laboratory workup, CTA head and neck, reevaluation and disposition [MP]  1456 Assumed care from Dr Elayne Snare. Hx of seizures on lamictal had a seizures at work. Was observed by ems and was post ictal for them. Had some BL lower extremity weakness and expressive aphasia. Still has some expressive aphasia but no other deficits. LKW 130 pm. Was dw neuro (Dr Armanda Magic) who felt that we should hold off on stroke activation. Does have CTA and stroke labs in. Keppra loaded.  Has some chest pain. Unclear the inciting event.  [RP]  1524 Dr Armanda Magic has seen the patient and recommends 500 mg of Keppra twice daily.  Feel that she is postictal at this time. [RP]    Clinical Course User Index [MP] Royanne Foots, DO [RP] Rondel Baton, MD                                 Medical Decision Making 53 year old female with history as above presenting via EMS after seizure episode at work today.  Postictal symptoms of lower extremity weakness and expressive aphasia.  She is able to move her lower extremities now but is still having difficulty with speech production.  Aside from expressive aphasia, there is no focal neurologic deficits on my initial exam.  Given her postictal state and improving symptoms, I did not activate as a code stroke but will talk things over with neurology.  We are still well inside the window for stroke activation if need be.  Will continue to monitor on seizure precautions and obtain her standard seizure workup including laboratory studies EKG chest x-ray.  Regarding the chest pain will evaluate for ACS with positive troponin EKG.  Will obtain CT head and neck with without contrast to evaluate for stroke in light of her aphasia but again lower  suspicion as she has had similar episodes of aphasia associated with postictal period.  Will provide IV Keppra load and continue to monitor  Amount and/or Complexity of Data Reviewed Labs: ordered. Radiology: ordered.  Risk Prescription drug management.           Final Clinical Impression(s) / ED Diagnoses Final diagnoses:  Seizure (HCC)  Chest pain, unspecified type    Rx / DC Orders ED Discharge Orders     None         Royanne Foots, DO 05/23/23 1555

## 2023-05-23 NOTE — ED Provider Notes (Signed)
  Physical Exam  BP (!) 161/104   Pulse 65   Temp 97.6 F (36.4 C) (Oral)   Resp 17   Ht 5\' 1"  (1.549 m)   Wt 75 kg   SpO2 100%   BMI 31.24 kg/m   Physical Exam  Procedures  Procedures  ED Course / MDM   Clinical Course as of 05/23/23 1459  Mon May 23, 2023  1440 Discussed with Dr.Dessie (neurology).  We will not activate for stroke at this time given improving symptoms and likely postictal..  He will evaluate her shortly here in the ED [MP]  1456 Hx of seizures on lamictal had a seizures at work. Was observed by ems and was post ictal for them. Had some BL lower extremity weakness and expressive aphasia. Still has some expressive aphasia but no other deficits. LKW 130 pm. Was dw neuro (Dr Armanda Magic) who felt that we should hold off on stroke activation. Does have CTA and stroke labs in. Keppra loaded. Has some chest pain. Unclear the inciting event.  [RP]    Clinical Course User Index [MP] Royanne Foots, DO [RP] Rondel Baton, MD   Medical Decision Making Amount and/or Complexity of Data Reviewed Labs: ordered. Radiology: ordered.  Risk Prescription drug management.   ***

## 2023-05-24 ENCOUNTER — Observation Stay (HOSPITAL_COMMUNITY): Payer: MEDICAID

## 2023-05-24 DIAGNOSIS — K5909 Other constipation: Secondary | ICD-10-CM | POA: Diagnosis present

## 2023-05-24 DIAGNOSIS — G40909 Epilepsy, unspecified, not intractable, without status epilepticus: Secondary | ICD-10-CM | POA: Diagnosis present

## 2023-05-24 DIAGNOSIS — R29810 Facial weakness: Secondary | ICD-10-CM | POA: Diagnosis present

## 2023-05-24 DIAGNOSIS — F3130 Bipolar disorder, current episode depressed, mild or moderate severity, unspecified: Secondary | ICD-10-CM | POA: Diagnosis present

## 2023-05-24 DIAGNOSIS — Z90710 Acquired absence of both cervix and uterus: Secondary | ICD-10-CM | POA: Diagnosis not present

## 2023-05-24 DIAGNOSIS — K219 Gastro-esophageal reflux disease without esophagitis: Secondary | ICD-10-CM | POA: Diagnosis present

## 2023-05-24 DIAGNOSIS — Z8052 Family history of malignant neoplasm of bladder: Secondary | ICD-10-CM | POA: Diagnosis not present

## 2023-05-24 DIAGNOSIS — Z8042 Family history of malignant neoplasm of prostate: Secondary | ICD-10-CM | POA: Diagnosis not present

## 2023-05-24 DIAGNOSIS — Z885 Allergy status to narcotic agent status: Secondary | ICD-10-CM | POA: Diagnosis not present

## 2023-05-24 DIAGNOSIS — F4310 Post-traumatic stress disorder, unspecified: Secondary | ICD-10-CM | POA: Diagnosis present

## 2023-05-24 DIAGNOSIS — Z8249 Family history of ischemic heart disease and other diseases of the circulatory system: Secondary | ICD-10-CM | POA: Diagnosis not present

## 2023-05-24 DIAGNOSIS — Z886 Allergy status to analgesic agent status: Secondary | ICD-10-CM | POA: Diagnosis not present

## 2023-05-24 DIAGNOSIS — I10 Essential (primary) hypertension: Secondary | ICD-10-CM

## 2023-05-24 DIAGNOSIS — Z888 Allergy status to other drugs, medicaments and biological substances status: Secondary | ICD-10-CM | POA: Diagnosis not present

## 2023-05-24 DIAGNOSIS — F419 Anxiety disorder, unspecified: Secondary | ICD-10-CM | POA: Diagnosis present

## 2023-05-24 DIAGNOSIS — Z800 Family history of malignant neoplasm of digestive organs: Secondary | ICD-10-CM | POA: Diagnosis not present

## 2023-05-24 DIAGNOSIS — R4701 Aphasia: Secondary | ICD-10-CM | POA: Diagnosis present

## 2023-05-24 DIAGNOSIS — R569 Unspecified convulsions: Secondary | ICD-10-CM

## 2023-05-24 DIAGNOSIS — R7303 Prediabetes: Secondary | ICD-10-CM | POA: Diagnosis present

## 2023-05-24 DIAGNOSIS — Z79899 Other long term (current) drug therapy: Secondary | ICD-10-CM | POA: Diagnosis not present

## 2023-05-24 DIAGNOSIS — F17210 Nicotine dependence, cigarettes, uncomplicated: Secondary | ICD-10-CM | POA: Diagnosis present

## 2023-05-24 DIAGNOSIS — M1990 Unspecified osteoarthritis, unspecified site: Secondary | ICD-10-CM | POA: Diagnosis present

## 2023-05-24 DIAGNOSIS — F32A Depression, unspecified: Secondary | ICD-10-CM

## 2023-05-24 DIAGNOSIS — Z8673 Personal history of transient ischemic attack (TIA), and cerebral infarction without residual deficits: Secondary | ICD-10-CM | POA: Diagnosis not present

## 2023-05-24 DIAGNOSIS — Z9151 Personal history of suicidal behavior: Secondary | ICD-10-CM | POA: Diagnosis not present

## 2023-05-24 DIAGNOSIS — Z7984 Long term (current) use of oral hypoglycemic drugs: Secondary | ICD-10-CM | POA: Diagnosis not present

## 2023-05-24 LAB — D-DIMER, QUANTITATIVE: D-Dimer, Quant: 0.3 ug{FEU}/mL (ref 0.00–0.50)

## 2023-05-24 LAB — HIV ANTIBODY (ROUTINE TESTING W REFLEX): HIV Screen 4th Generation wRfx: NONREACTIVE

## 2023-05-24 MED ORDER — LINACLOTIDE 72 MCG PO CAPS
72.0000 ug | ORAL_CAPSULE | Freq: Every day | ORAL | Status: DC
  Administered 2023-05-25 – 2023-05-26 (×2): 72 ug via ORAL
  Filled 2023-05-24 (×3): qty 1

## 2023-05-24 MED ORDER — SODIUM CHLORIDE 0.9 % IV SOLN: INTRAVENOUS | Status: AC

## 2023-05-24 MED ORDER — VENLAFAXINE HCL ER 75 MG PO CP24
150.0000 mg | ORAL_CAPSULE | Freq: Every day | ORAL | Status: DC
  Administered 2023-05-25 – 2023-05-26 (×2): 150 mg via ORAL
  Filled 2023-05-24 (×2): qty 2

## 2023-05-24 MED ORDER — QUETIAPINE FUMARATE 100 MG PO TABS
300.0000 mg | ORAL_TABLET | Freq: Every day | ORAL | Status: DC
  Administered 2023-05-24 – 2023-05-25 (×2): 300 mg via ORAL
  Filled 2023-05-24 (×2): qty 3

## 2023-05-24 MED ORDER — LAMOTRIGINE 25 MG PO TABS
100.0000 mg | ORAL_TABLET | Freq: Every day | ORAL | Status: DC
Start: 2023-05-24 — End: 2023-05-24

## 2023-05-24 MED ORDER — METOPROLOL SUCCINATE ER 25 MG PO TB24
25.0000 mg | ORAL_TABLET | Freq: Every day | ORAL | Status: DC
  Administered 2023-05-24 – 2023-05-25 (×2): 25 mg via ORAL
  Filled 2023-05-24 (×2): qty 1

## 2023-05-24 MED ORDER — ONDANSETRON HCL 4 MG/2ML IJ SOLN
4.0000 mg | Freq: Four times a day (QID) | INTRAMUSCULAR | Status: DC | PRN
  Administered 2023-05-24: 4 mg via INTRAVENOUS
  Filled 2023-05-24: qty 2

## 2023-05-24 MED ORDER — ACETAMINOPHEN 325 MG PO TABS: 650.0000 mg | ORAL_TABLET | Freq: Four times a day (QID) | ORAL | Status: DC | PRN

## 2023-05-24 MED ORDER — PANTOPRAZOLE SODIUM 40 MG PO TBEC
40.0000 mg | DELAYED_RELEASE_TABLET | Freq: Every morning | ORAL | Status: DC
  Administered 2023-05-24 – 2023-05-26 (×3): 40 mg via ORAL
  Filled 2023-05-24 (×3): qty 1

## 2023-05-24 MED ORDER — TRAZODONE HCL 50 MG PO TABS
150.0000 mg | ORAL_TABLET | Freq: Every day | ORAL | Status: DC
  Administered 2023-05-24 – 2023-05-25 (×2): 150 mg via ORAL
  Filled 2023-05-24 (×2): qty 3

## 2023-05-24 MED ORDER — ENOXAPARIN SODIUM 40 MG/0.4ML IJ SOSY
40.0000 mg | PREFILLED_SYRINGE | INTRAMUSCULAR | Status: DC
  Administered 2023-05-24 – 2023-05-26 (×3): 40 mg via SUBCUTANEOUS
  Filled 2023-05-24 (×3): qty 0.4

## 2023-05-24 MED ORDER — NALOXONE HCL 0.4 MG/ML IJ SOLN: 0.4000 mg | INTRAMUSCULAR | Status: DC | PRN

## 2023-05-24 MED ORDER — LAMOTRIGINE 100 MG PO TABS
100.0000 mg | ORAL_TABLET | Freq: Every day | ORAL | Status: DC
Start: 2023-05-24 — End: 2023-05-26
  Administered 2023-05-24 – 2023-05-26 (×3): 100 mg via ORAL
  Filled 2023-05-24 (×3): qty 1

## 2023-05-24 MED ORDER — HYDROMORPHONE HCL 1 MG/ML IJ SOLN
0.5000 mg | Freq: Once | INTRAMUSCULAR | Status: AC | PRN
  Administered 2023-05-24: 0.5 mg via INTRAVENOUS
  Filled 2023-05-24: qty 1

## 2023-05-24 MED ORDER — HYDROMORPHONE HCL 1 MG/ML IJ SOLN
0.5000 mg | Freq: Four times a day (QID) | INTRAMUSCULAR | Status: DC | PRN
  Filled 2023-05-24: qty 1

## 2023-05-24 MED ORDER — VALPROATE SODIUM 100 MG/ML IV SOLN
1000.0000 mg | Freq: Once | INTRAVENOUS | Status: AC
  Administered 2023-05-24: 1000 mg via INTRAVENOUS
  Filled 2023-05-24: qty 10

## 2023-05-24 MED ORDER — AMLODIPINE BESYLATE 5 MG PO TABS
5.0000 mg | ORAL_TABLET | Freq: Every day | ORAL | Status: DC
  Administered 2023-05-24 – 2023-05-26 (×3): 5 mg via ORAL
  Filled 2023-05-24 (×3): qty 1

## 2023-05-24 MED ORDER — OXYCODONE HCL 5 MG PO TABS
5.0000 mg | ORAL_TABLET | Freq: Four times a day (QID) | ORAL | Status: DC | PRN
  Administered 2023-05-24 – 2023-05-25 (×2): 5 mg via ORAL
  Filled 2023-05-24 (×2): qty 1

## 2023-05-24 NOTE — Evaluation (Signed)
 Speech Language Pathology Evaluation Patient Details Name: Heather Figueroa MRN: [id number] DOB: Dec 13, 1970 Today's Date: 05/24/2023 Time: 1433-1450 SLP Time Calculation (min) (ACUTE ONLY): 17 min  Problem List:  Patient Active Problem List   Diagnosis Date Noted   Prediabetes 05/24/2023   Essential hypertension 05/24/2023   Anxiety and depression 05/24/2023   Seizure (HCC) 05/23/2023   ASD (atrial septal defect): Possible 06/20/2021   Tobacco abuse 06/20/2021   Hypokalemia 06/19/2021   Chest pain 06/18/2021   Seizure disorder (HCC) 06/18/2021   Well woman exam with routine gynecological exam 01/16/2019   Cervical spondylosis with myelopathy and radiculopathy 01/26/2017   Past Medical History:  Past Medical History:  Diagnosis Date   Anemia    Anxiety    Arthritis    Depression    Headache    Hypertension    Neuromuscular disorder (HCC)    Seizures (HCC)    pt has had 2 seizures in July 2018   Stroke [facility])    possible TIA in July 2018   Past Surgical History:  Past Surgical History:  Procedure Laterality Date   ABDOMINAL HYSTERECTOMY     2002   ANTERIOR CERVICAL DECOMP/DISCECTOMY FUSION N/A 01/26/2017   Procedure: ANTERIOR CERVICAL DECOMPRESSION/DISCECTOMY FUSION, INTERBODY PROSTHESIS, PLATE/SCREWS, POSSIBLE CORPECTOMY CERVICAL FOUR- CERVICAL FIVE, CERVICAL FIVE- CERVICAL SIX, CERVICAL SIX- CERVICAL SEVEN;  Surgeon: Tressie Stalker, MD;  Location: [facility] OR;  Service: Neurosurgery;  Laterality: N/A;  ANTERIOR CERVICAL DECOMPRESSION/DISCECTOMY FUSION, INTERBODY PROSTHESIS, PLATE/SCREWS, POSSIBLE CORPE   APPENDECTOMY     2010   COLONOSCOPY N/A 04/05/2022   Procedure: COLONOSCOPY;  Surgeon: Jaynie Collins, DO;  Location: [facility] ENDOSCOPY;  Service: Gastroenterology;  Laterality: N/A;   ESOPHAGOGASTRODUODENOSCOPY N/A 04/05/2022   Procedure: ESOPHAGOGASTRODUODENOSCOPY (EGD);  Surgeon: Jaynie Collins, DO;  Location: [facility] ENDOSCOPY;  Service: Gastroenterology;   Laterality: N/A;   OVARIAN CYST REMOVAL     TUBAL LIGATION     1998   HPI:  Heather Figueroa is a 53 y.o. female who presented to the ED via EMS from work for evaluation of seizure and chest pain. Patient reportedly had a seizure while at work; upon EMS arrival, patient was postictal.  She had difficulty moving her lower extremities and difficulty speaking; EEG WNL. MRI negative for acute abnormality. PMHx significant for hypertension, history of functional neurologic disorder with gait impairment, seizures, CVA/TIA, anxiety/depression, chronic constipation, GERD, history of PFO, prediabetes, tobacco abuse   Assessment / Plan / Recommendation Clinical Impression  Pt presents with a speech impairment that impacts intelligibility, with notable errors of substitution and inconsistent/variable productions.  Expressive language, word-retrieval, divergent naming were WNL. Comprehension is WNL with excellent ability to follow commands and reliable yes/no accuracy. Cognition WNL. Characteristics of speech are c/w a functional impairment - pt will benefit from SLP intervention while admitted.    SLP Assessment  SLP Recommendation/Assessment: Patient needs continued Speech Lanaguage Pathology Services SLP Visit Diagnosis: Dysarthria and anarthria (R47.1)    Recommendations for follow up therapy are one component of a multi-disciplinary discharge planning process, led by the attending physician.  Recommendations may be updated based on patient status, additional functional criteria and insurance authorization.    Follow Up Recommendations  Other (comment) (tba)    Assistance Recommended at Discharge  PRN  Functional Status Assessment    Frequency and Duration min 2x/week  1 week      SLP Evaluation Cognition  Overall Cognitive Status: Within Functional Limits for tasks assessed Arousal/Alertness: Awake/alert Orientation Level: Oriented X4 Year:  2025 Month: April Day of Week:  Correct Attention: Sustained Sustained Attention: Appears intact Memory: Appears intact       Comprehension  Auditory Comprehension Overall Auditory Comprehension: Appears within functional limits for tasks assessed Yes/No Questions: Within Functional Limits Commands: Within Functional Limits Reading Comprehension Reading Status: Not tested    Expression Expression Primary Mode of Expression: Verbal Verbal Expression Overall Verbal Expression: Appears within functional limits for tasks assessed Initiation: No impairment Level of Generative/Spontaneous Verbalization: Sentence Repetition: Impaired Naming: No impairment Pragmatics: No impairment Written Expression Dominant Hand: Left Written Expression: Within Functional Limits   Oral / Motor  Oral Motor/Sensory Function Overall Oral Motor/Sensory Function: Within functional limits Motor Speech Overall Motor Speech: Impaired Respiration: Within functional limits Phonation: Normal Resonance: Within functional limits Articulation: Impaired Level of Impairment: Word Intelligibility: Intelligibility reduced Word: 50-74% accurate Phrase: 50-74% accurate Sentence: 75-100% accurate Motor Speech Errors: Groping for words;Inconsistent           Heather Figueroa L. Samson Frederic, MA CCC/SLP Clinical Specialist - Acute Care SLP Acute Rehabilitation Services Office number [phone]  Blenda Mounts Laurice 05/24/2023, 3:38 PM

## 2023-05-24 NOTE — Progress Notes (Signed)
 Attempted EEG, patient currently with PT. Will return later to complete.

## 2023-05-24 NOTE — H&P (Signed)
 History and Physical    Heather Figueroa ZOX:[id number] DOB: [birth year] DOA: 05/23/2023  PCP: Kandyce Rud, MD  Chief Complaint: Seizure  HPI: Heather Figueroa is a 53 y.o. female with medical history significant of hypertension, history of functional neurologic disorder with gait impairment, seizures, CVA/TIA, anxiety/depression, chronic constipation, GERD, history of PFO, prediabetes, tobacco abuse presented to the ED via EMS from work for evaluation of seizure and chest pain.  Patient reportedly had a seizure while at work yesterday afternoon.  On EMS arrival, patient was postictal.  She had difficulty moving her lower extremities and difficulty speaking.  Patient reported compliance with her Lamictal.  Vital signs on arrival: Temperature 97.6 F, pulse 74, respiratory rate 18, blood pressure 145/86, and SpO2 100% on room air.  No significant acute abnormalities on CBC and CMP.  Troponin negative x 2, lactate normal x 2, ethanol level <10, UA not suggestive of infection.  Chest x-ray showing no acute cardiopulmonary process.  CTA head and neck negative for acute findings.  Brain MRI negative for acute intracranial abnormality.  X-ray of right shoulder negative for acute displaced fracture or dislocation.  Patient was given Dilaudid and IV Keppra 1500 mg in the ED.  She continued to have persistent weakness of her right upper extremity, left lower extremity, left facial weakness, and expressive aphasia in the ED.  Neurology felt that her symptoms were suspicious for a behavioral episode.  Recommended EEG and if abnormal then formally consult neurology again.  If EEG negative then likely functional neurologic disorder which requires evaluation by psychiatry.  For now, neurology recommending continuing her home Lamictal and outpatient follow-up with neurology to see if Keppra needs to be added to her medication regimen.  Reportedly patient has not followed up with outpatient neurology since 2019 and will  need a new referral.  History very limited due to patient having expressive aphasia.  She reports having a seizure at work yesterday.  Also reporting substernal/right-sided chest tightness which started at work yesterday.  Reports taking her home Lamictal every day except missed a dose 3 days ago.  She is not able to give any additional history at this time.  Review of Systems:  Review of Systems  All other systems reviewed and are negative.   Past Medical History:  Diagnosis Date   Anemia    Anxiety    Arthritis    Depression    Headache    Hypertension    Neuromuscular disorder (HCC)    Seizures (HCC)    pt has had 2 seizures in July 2018   Stroke [facility])    possible TIA in July 2018    Past Surgical History:  Procedure Laterality Date   ABDOMINAL HYSTERECTOMY     2002   ANTERIOR CERVICAL DECOMP/DISCECTOMY FUSION N/A 01/26/2017   Procedure: ANTERIOR CERVICAL DECOMPRESSION/DISCECTOMY FUSION, INTERBODY PROSTHESIS, PLATE/SCREWS, POSSIBLE CORPECTOMY CERVICAL FOUR- CERVICAL FIVE, CERVICAL FIVE- CERVICAL SIX, CERVICAL SIX- CERVICAL SEVEN;  Surgeon: Tressie Stalker, MD;  Location: [facility] OR;  Service: Neurosurgery;  Laterality: N/A;  ANTERIOR CERVICAL DECOMPRESSION/DISCECTOMY FUSION, INTERBODY PROSTHESIS, PLATE/SCREWS, POSSIBLE CORPE   APPENDECTOMY     2010   COLONOSCOPY N/A 04/05/2022   Procedure: COLONOSCOPY;  Surgeon: Jaynie Collins, DO;  Location: [facility] ENDOSCOPY;  Service: Gastroenterology;  Laterality: N/A;   ESOPHAGOGASTRODUODENOSCOPY N/A 04/05/2022   Procedure: ESOPHAGOGASTRODUODENOSCOPY (EGD);  Surgeon: Jaynie Collins, DO;  Location: [facility] ENDOSCOPY;  Service: Gastroenterology;  Laterality: N/A;   OVARIAN CYST REMOVAL     TUBAL LIGATION  1998     reports that she has been smoking cigarettes. She has never used smokeless tobacco. She reports current alcohol use. She reports that she does not use drugs.  Allergies  Allergen Reactions   Pentazocine Anaphylaxis     [place]   Percocet [Oxycodone-Acetaminophen] Other (See Comments)    Swelling generalized   Tylenol [Acetaminophen] Swelling    Family History  Problem Relation Age of Onset   Colon cancer Mother    Hypertension Father    Prostate cancer Father    Pulmonary embolism Sister    Bladder Cancer Maternal Grandmother     Prior to Admission medications   Medication Sig Start Date End Date Taking? Authorizing Provider  amLODipine (NORVASC) 5 MG tablet Take 1 tablet by mouth daily at 12 noon. 10/28/21 05/24/23 Yes [provider]  cholecalciferol (VITAMIN D3) 25 MCG (1000 UNIT) tablet Take 1,000 Units by mouth daily.   Yes [provider]  lamoTRIgine (LAMICTAL) 100 MG tablet Take 100 mg by mouth daily.   Yes [provider]  linaclotide (LINZESS) 72 MCG capsule Take 72 mcg by mouth daily before breakfast.   Yes [provider]  metFORMIN (GLUCOPHAGE) 500 MG tablet Take 2 tablets by mouth 2 (two) times daily with a meal.   Yes [provider]  metoprolol succinate (TOPROL XL) 25 MG 24 hr tablet Take 1 tablet (25 mg total) by mouth daily at 10 pm. 07/17/21 05/23/24 Yes Tolia, Sunit, DO  olmesartan-hydrochlorothiazide (BENICAR HCT) 40-25 MG tablet Take 1 tablet by mouth daily. 02/25/22 05/23/24 Yes Tolia, Sunit, DO  pantoprazole (PROTONIX) 40 MG tablet Take 40 mg by mouth every morning. 05/11/21  Yes [provider]  prazosin (MINIPRESS) 2 MG capsule Take 2 mg by mouth at bedtime.   Yes [provider]  QUEtiapine (SEROQUEL) 300 MG tablet Take 300 mg by mouth at bedtime.   Yes [provider]  rizatriptan (MAXALT) 10 MG tablet Take 10 mg by mouth as needed for migraine.    Yes [provider]  sucralfate (CARAFATE) 1 g tablet Take 1 g by mouth 4 (four) times daily. 06/10/22 06/10/23 Yes [provider]  traZODone (DESYREL) 100 MG tablet Take 150 mg by mouth at bedtime. 11/04/15  Yes [provider]  venlafaxine XR  (EFFEXOR-XR) 150 MG 24 hr capsule Take 150 mg by mouth daily with breakfast. 11/05/15  Yes [provider]  nicotine (NICODERM CQ - DOSED IN MG/24 HR) 7 mg/24hr patch Place 1 patch (7 mg total) onto the skin daily. Patient not taking: Reported on 05/24/2023 02/25/22   Tessa Lerner, DO  nicotine polacrilex (NICOTINE MINI) 4 MG lozenge Take 1 lozenge (4 mg total) by mouth as needed for smoking cessation. Patient not taking: Reported on 05/24/2023 02/25/22   Tessa Lerner, DO    Physical Exam: Vitals:   05/23/23 1749 05/23/23 2240 05/23/23 2241 05/24/23 0240  BP:   (!) 163/79 (!) 140/73  Pulse:   66 65  Resp:   16 11  Temp: 97.7 F (36.5 C) 97.9 F (36.6 C)  (!) 97.5 F (36.4 C)  TempSrc: Oral Oral  Oral  SpO2:   100% 99%  Weight:      Height:        Physical Exam Vitals reviewed.  Constitutional:      General: She is not in acute distress. HENT:     Head: Normocephalic and atraumatic.  Eyes:     Extraocular Movements: Extraocular movements intact.  Cardiovascular:     Rate and Rhythm: Normal rate and regular rhythm.     Pulses: Normal pulses.  Pulmonary:     Effort: Pulmonary effort is normal. No respiratory distress.     Breath sounds: Normal breath sounds. No wheezing, rhonchi or rales.  Abdominal:     General: Bowel sounds are normal. There is no distension.     Palpations: Abdomen is soft.     Tenderness: There is no abdominal tenderness. There is no guarding.  Musculoskeletal:     Cervical back: Normal range of motion.     Right lower leg: No edema.     Left lower leg: No edema.  Skin:    General: Skin is warm and dry.  Neurological:     Mental Status: She is alert and oriented to person, place, and time.     Comments: Expressive aphasia Mild weakness of her right upper and lower extremity.  No flaccid paralysis.      Labs on Admission: I have personally reviewed following labs and imaging studies  CBC: Recent Labs  Lab 05/23/23 1406  WBC 5.7  NEUTROABS  2.0  HGB 12.6  HCT 39.8  MCV 78.8*  PLT 223   Basic Metabolic Panel: Recent Labs  Lab 05/23/23 1406  NA 142  K 3.6  CL 109  CO2 24  GLUCOSE 96  BUN 13  CREATININE 0.80  CALCIUM 9.7   GFR: Estimated Creatinine Clearance: 77.1 mL/min (by C-G formula based on SCr of 0.8 mg/dL). Liver Function Tests: Recent Labs  Lab 05/23/23 1406  AST 19  ALT 16  ALKPHOS 102  BILITOT 0.3  PROT 7.0  ALBUMIN 4.1   No results for input(s): "LIPASE", "AMYLASE" in the last 168 hours. No results for input(s): "AMMONIA" in the last 168 hours. Coagulation Profile: Recent Labs  Lab 05/23/23 1521  INR 1.1   Cardiac Enzymes: No results for input(s): "CKTOTAL", "CKMB", "CKMBINDEX", "TROPONINI" in the last 168 hours. BNP (last 3 results) No results for input(s): "PROBNP" in the last 8760 hours. HbA1C: No results for input(s): "HGBA1C" in the last 72 hours. CBG: Recent Labs  Lab 05/23/23 1347  GLUCAP 92   Lipid Profile: No results for input(s): "CHOL", "HDL", "LDLCALC", "TRIG", "CHOLHDL", "LDLDIRECT" in the last 72 hours. Thyroid Function Tests: No results for input(s): "TSH", "T4TOTAL", "FREET4", "T3FREE", "THYROIDAB" in the last 72 hours. Anemia Panel: No results for input(s): "VITAMINB12", "FOLATE", "FERRITIN", "TIBC", "IRON", "RETICCTPCT" in the last 72 hours. Urine analysis:    Component Value Date/Time   COLORURINE YELLOW 05/23/2023 1745   APPEARANCEUR HAZY (A) 05/23/2023 1745   APPEARANCEUR Hazy 04/21/2013 0330   LABSPEC 1.030 05/23/2023 1745   LABSPEC 1.023 04/21/2013 0330   PHURINE 6.0 05/23/2023 1745   GLUCOSEU NEGATIVE 05/23/2023 1745   GLUCOSEU Negative 04/21/2013 0330   HGBUR NEGATIVE 05/23/2023 1745   BILIRUBINUR NEGATIVE 05/23/2023 1745   BILIRUBINUR Negative 04/21/2013 0330   KETONESUR NEGATIVE 05/23/2023 1745   PROTEINUR NEGATIVE 05/23/2023 1745   NITRITE NEGATIVE 05/23/2023 1745   LEUKOCYTESUR NEGATIVE 05/23/2023 1745   LEUKOCYTESUR 3+ 04/21/2013 0330     Radiological Exams on Admission: MR BRAIN WO CONTRAST Result Date: 05/23/2023 CLINICAL DATA:  Seizure EXAM: MRI HEAD WITHOUT CONTRAST TECHNIQUE: Multiplanar, multiecho pulse sequences of the brain and surrounding structures were obtained without intravenous contrast. COMPARISON:  11/05/2015 brain MRI 05/23/2023 CTA head neck FINDINGS: Brain: No acute infarct, mass effect or extra-axial collection. No acute or chronic hemorrhage. There is multifocal hyperintense T2-weighted  signal within the white matter. Parenchymal volume and CSF spaces are normal. The midline structures are normal. The hippocampi are normal and symmetric in size and signal. The hypothalamus and mamillary bodies are normal. There is no cortical ectopia or dysplasia. Vascular: Normal flow voids. Skull and upper cervical spine: Normal calvarium and skull base. Visualized upper cervical spine and soft tissues are normal. Sinuses/Orbits:No paranasal sinus fluid levels or advanced mucosal thickening. No mastoid or middle ear effusion. Normal orbits. IMPRESSION: 1. No acute intracranial abnormality. 2. No seizure focus identified. 3. Findings of chronic small vessel ischemia. Electronically Signed   By: Deatra Robinson M.D.   On: 05/23/2023 21:06   DG Shoulder Right Result Date: 05/23/2023 CLINICAL DATA:  R shoulder pain EXAM: RIGHT SHOULDER - 2+ VIEW COMPARISON:  None Available. FINDINGS: There is no evidence of fracture or dislocation. There is no evidence of severe arthropathy or other focal bone abnormality. Soft tissues are unremarkable. Cervical spine surgical hardware noted. IMPRESSION: No acute displaced fracture or dislocation. Electronically Signed   By: Tish Frederickson M.D.   On: 05/23/2023 20:20   CT ANGIO HEAD NECK W WO CM Result Date: 05/23/2023 CLINICAL DATA:  Possible CVA. Seizures. Postictal. Abnormal speech. Bilateral lower extremity weakness. EXAM: CT ANGIOGRAPHY HEAD AND NECK WITH AND WITHOUT CONTRAST TECHNIQUE:  Multidetector CT imaging of the head and neck was performed using the standard protocol during bolus administration of intravenous contrast. Multiplanar CT image reconstructions and MIPs were obtained to evaluate the vascular anatomy. Carotid stenosis measurements (when applicable) are obtained utilizing NASCET criteria, using the distal internal carotid diameter as the denominator. RADIATION DOSE REDUCTION: This exam was performed according to the departmental dose-optimization program which includes automated exposure control, adjustment of the mA and/or kV according to patient size and/or use of iterative reconstruction technique. CONTRAST:  75mL OMNIPAQUE IOHEXOL 350 MG/ML SOLN COMPARISON:  CT head without contrast 09/17/2018 FINDINGS: CT HEAD FINDINGS Brain: No acute infarct, hemorrhage, or mass lesion is present. No significant white matter lesions are present. Deep brain nuclei are within normal limits. The ventricles are of normal size. No significant extraaxial fluid collection is present. The brainstem and cerebellum are within normal limits. A relatively empty sella is present. Midline structures are otherwise within normal limits. Vascular: No hyperdense vessel or unexpected calcification. Skull: Calvarium is intact. No focal lytic or blastic lesions are present. No significant extracranial soft tissue lesion is present. Sinuses/Orbits: The paranasal sinuses and mastoid air cells are clear. The globes and orbits are within normal limits. Review of the MIP images confirms the above findings CTA NECK FINDINGS Aortic arch: A common origin of the left common carotid artery in the innominate artery is present. No significant atherosclerotic changes are present the aortic arch great vessel origins. No focal stenosis is present. Right carotid system: The right common carotid artery is within normal limits. Mild noncalcified plaque is present along the posterior aspect of the proximal right ICA without  significant stenosis relative to the more distal vessel. The cervical right ICA is otherwise normal. Left carotid system: The left common carotid artery is within normal limits. Bifurcation is unremarkable. The cervical left ICA is normal. Vertebral arteries: The right vertebral artery is dominant. Both vertebral arteries originate from the subclavian arteries without significant stenosis. No significant stenosis is present in either vertebral artery in the neck. Skeleton: Cervical spine fusion is noted C4-5 C5-6 and C6-7. Mild right foraminal narrowing is present at C2-3 secondary to uncovertebral and facet spurring. Other neck:  The soft tissues of the neck are otherwise unremarkable. Salivary glands are within normal limits. Thyroid is normal. No significant adenopathy is present. No focal mucosal or submucosal lesions are present. Upper chest: The lung apices are clear. The thoracic inlet is within normal limits. Review of the MIP images confirms the above findings CTA HEAD FINDINGS Anterior circulation: The internal carotid arteries are within normal limits from the skull base to the ICA termini. The left A1 segment is dominant. An azygous A2 segment is present. The MCA bifurcations are normal bilaterally. The ACA and MCA branch vessels are normal bilaterally. No aneurysm is present. Posterior circulation: PICA origins are visualized and normal. The vertebrobasilar junction basilar artery normal. The superior cerebellar arteries are patent. Both posterior cerebral arteries originate from the basilar tip. The PCA branch vessels are normal bilaterally. Venous sinuses: The dural sinuses are patent. The straight sinus and deep cerebral veins are intact. Cortical veins are within normal limits. No significant vascular malformation is evident. Anatomic variants: None Review of the MIP images confirms the above findings IMPRESSION: 1. Normal noncontrast CT of the head. 2. Mild noncalcified plaque along the posterior  aspect of the proximal right ICA without significant stenosis relative to the more distal vessel. 3. Otherwise normal CTA of the neck. 4. Normal CTA Circle of Willis without significant proximal stenosis, aneurysm, or branch vessel occlusion. 5. Cervical spine fusion C4-5 C5-6 and C6-7. 6. Mild right foraminal narrowing at C2-3 secondary to uncovertebral and facet spurring. Electronically Signed   By: Marin Roberts M.D.   On: 05/23/2023 18:25   DG Chest Portable 1 View Result Date: 05/23/2023 CLINICAL DATA:  Chest pain and tightness. EXAM: PORTABLE CHEST 1 VIEW COMPARISON:  CT 06/18/2021.  Radiographs 06/18/2021 and 08/14/2018. FINDINGS: 1421 hours. The heart size and mediastinal contours are stable. Lower lung volumes with mild resulting bibasilar atelectasis. No edema, confluent airspace disease, pleural effusion or pneumothorax. No acute osseous findings are evident status post cervical fusion. Telemetry leads overlie the chest. IMPRESSION: Lower lung volumes with mild bibasilar atelectasis. No acute cardiopulmonary process. Electronically Signed   By: Carey Bullocks M.D.   On: 05/23/2023 15:41    EKG: Independently reviewed.  Sinus rhythm with first-degree AV block, no acute ischemic changes.  Assessment and Plan  Seizure Patient with history of functional neurologic disorder with gait impairment and also history of seizure disorder on Lamictal presenting after she had a witnessed seizure at work.  She was given IV Keppra 1500 mg in the ED and no recurrence of seizure-like activity since then but continues to have persistent expressive aphasia and weakness of her right upper and lower extremities.  CTA head and neck negative for acute findings.  Brain MRI negative for acute intracranial abnormality. Neurology felt that her symptoms were suspicious for a behavioral episode.  Recommended EEG and if abnormal then formally consult neurology again.  If EEG negative then likely functional neurologic  disorder which requires evaluation by psychiatry.  For now, neurology recommending continuing her home Lamictal and outpatient follow-up with neurology to see if Keppra needs to be added to her medication regimen.  Reportedly patient has not followed up with outpatient neurology since 2019 and will need a new referral. -Informed by RN that patient is currently not able to sit up in bed to safely take p.o. meds.  Discussed with neurology, recommending giving IV valproate 1000 mg x 1 and resuming Lamictal in the morning once she is able to take p.o. meds. -IV fluid  hydration for now -EEG ordered -Seizure precautions -PT/OT eval, fall precautions  Chest pain Chest x-ray showing no acute cardiopulmonary process.  Workup not suggestive of ACS.  PE less likely given no tachycardia or hypoxia.  Check D-dimer.  Prediabetes Recent A1c 6.4 on 05/02/2023.  Hypertension Anxiety/depression Chronic constipation GERD Resume home meds once patient is safely able to take p.o. meds.  DVT prophylaxis: Lovenox Code Status: Full Code (discussed with the patient) Family Communication: No family available at this time. Level of care: Progressive Care Unit Admission status: It is my clinical opinion that referral for OBSERVATION is reasonable and necessary in this patient based on the above information provided. The aforementioned taken together are felt to place the patient at high risk for further clinical deterioration. However, it is anticipated that the patient may be medically stable for discharge from the hospital within 24 to 48 hours.  John Giovanni MD [facility]  If 7PM-7AM, please contact night-coverage [email or web address]  05/24/2023, 3:20 AM

## 2023-05-24 NOTE — Progress Notes (Addendum)
 PROGRESS NOTE    Heather Figueroa  YNW:[id number] DOB: Aug 21, 1970 DOA: 05/23/2023 PCP: Kandyce Rud, MD   Brief Narrative:  HPI: Heather Figueroa is a 53 y.o. female with medical history significant of hypertension, history of functional neurologic disorder with gait impairment, seizures, CVA/TIA, anxiety/depression, chronic constipation, GERD, history of PFO, prediabetes, tobacco abuse presented to the ED via EMS from work for evaluation of seizure and chest pain.  Patient reportedly had a seizure while at work yesterday afternoon.  On EMS arrival, patient was postictal.  She had difficulty moving her lower extremities and difficulty speaking.  Patient reported compliance with her Lamictal.  Vital signs on arrival: Temperature 97.6 F, pulse 74, respiratory rate 18, blood pressure 145/86, and SpO2 100% on room air.  No significant acute abnormalities on CBC and CMP.  Troponin negative x 2, lactate normal x 2, ethanol level <10, UA not suggestive of infection.  Chest x-ray showing no acute cardiopulmonary process.  CTA head and neck negative for acute findings.  Brain MRI negative for acute intracranial abnormality.  X-ray of right shoulder negative for acute displaced fracture or dislocation.  Patient was given Dilaudid and IV Keppra 1500 mg in the ED.  She continued to have persistent weakness of her right upper extremity, left lower extremity, left facial weakness, and expressive aphasia in the ED.  Neurology felt that her symptoms were suspicious for a behavioral episode.  Recommended EEG and if abnormal then formally consult neurology again.  If EEG negative then likely functional neurologic disorder which requires evaluation by psychiatry.  For now, neurology recommending continuing her home Lamictal and outpatient follow-up with neurology to see if Keppra needs to be added to her medication regimen.  Reportedly patient has not followed up with outpatient neurology since 2019 and will need a new  referral.   History very limited due to patient having expressive aphasia.  She reports having a seizure at work yesterday.  Also reporting substernal/right-sided chest tightness which started at work yesterday.  Reports taking her home Lamictal every day except missed a dose 3 days ago.  She is not able to give any additional history at this time.  Assessment & Plan:   Principal Problem:   Seizure [facility]) Active Problems:   Chest pain   Prediabetes   Essential hypertension   Anxiety and depression  Seizure? Patient with history of functional neurologic disorder with gait impairment and also history of seizure disorder on Lamictal presented after she had a witnessed seizure at work.  She was given IV Keppra 1500 mg in the ED and no recurrence of seizure-like activity since then but continued to have persistent expressive aphasia and weakness of her right upper and lower extremities.  CTA head and neck negative for acute findings.  Brain MRI negative for acute intracranial abnormality. Neurology felt that her symptoms were suspicious for a behavioral episode.  Recommended EEG and if abnormal then formally consult neurology again.  If EEG negative then likely functional neurologic disorder which requires evaluation by psychiatry.  For now, neurology recommending continuing her home Lamictal and outpatient follow-up with neurology to see if Keppra needs to be added to her medication regimen.  Reportedly patient has not followed up with outpatient neurology since 2019 and will need a new referral.  On my evaluation now, patient is following all commands, she appears to have generalized weakness but no focal weakness.  She does have some expressive aphasia.  Unsure if this is postictal state versus functional neurological  disorder.  She has not passed a swallow either.  We will have SLP, PT and OT assess her.  If she would not recover by tomorrow, will consult psychiatry.   Chest pain/neck pain Chest  x-ray showing no acute cardiopulmonary process.  Workup not suggestive of ACS.  D-dimer negative.  Thromboembolism ruled out.  Although she is complaining of neck pain but she is too lethargic to receive any opioid medications.  Will treat with Tylenol only for now and we will consider opioids once she is more awake.   Prediabetes Recent A1c 6.4 on 05/02/2023.  Blood sugar controlled.   Hypertension: Blood pressure remained stable until this morning which was slightly elevated and thus I will resume amlodipine and Toprol-XL but hold Benicar and Minipress for now.  Anxiety/depression: Resume Seroquel, Effexor and trazodone.  GERD: Resume PPI.  DVT prophylaxis: enoxaparin (LOVENOX) injection 40 mg Start: 05/24/23 1600   Code Status: Full Code  Family Communication:  None present at bedside.  Plan of care discussed with patient in length and he/she verbalized understanding and agreed with it.  Status is: Observation The patient will require care spanning > 2 midnights and should be moved to inpatient because: Needs EEG, PT OT and SLP assessment.  Still very lethargic.   Estimated body mass index is 31.24 kg/m as calculated from the following:   Height as of this encounter: 5\' 1"  (1.549 m).   Weight as of this encounter: 75 kg.    Nutritional Assessment: Body mass index is 31.24 kg/m.Marland Kitchen Seen by dietician.  I agree with the assessment and plan as outlined below: Nutrition Status:        . Skin Assessment: I have examined the patient's skin and I agree with the wound assessment as performed by the wound care RN as outlined below:    Consultants:  Neurology curbsided  Procedures:  None  Antimicrobials:  Anti-infectives (From admission, onward)    None         Subjective: Patient seen and examined in the ED.  She is complaining of right leg pain and neck pain.  No chest pain.  She still has expressive aphasia.  Appears to be very weak and very lethargic.  No other  complaint.  She is fully oriented despite of being so weak and lethargic.  Objective: Vitals:   05/24/23 0530 05/24/23 0600 05/24/23 0650 05/24/23 0700  BP: 127/68 118/73  (!) 154/98  Pulse: 60 60  72  Resp: 12 13  18   Temp:   98.4 F (36.9 C)   TempSrc:      SpO2: 96% 99%  100%  Weight:      Height:        Intake/Output Summary (Last 24 hours) at 05/24/2023 0940 Last data filed at 05/23/2023 1544 Gross per 24 hour  Intake 97.65 ml  Output --  Net 97.65 ml   Filed Weights   05/23/23 1354  Weight: 75 kg    Examination:  General exam: Appears calm and comfortable  Respiratory system: Clear to auscultation. Respiratory effort normal. Cardiovascular system: S1 & S2 heard, RRR. No JVD, murmurs, rubs, gallops or clicks. No pedal edema. Gastrointestinal system: Abdomen is nondistended, soft and nontender. No organomegaly or masses felt. Normal bowel sounds heard. Central nervous system: Lethargic but oriented. No focal neurological deficits. Extremities: Symmetric 5 x 5 power. Skin: No rashes, lesions or ulcers  Data Reviewed: I have personally reviewed following labs and imaging studies  CBC: Recent Labs  Lab 05/23/23  1406  WBC 5.7  NEUTROABS 2.0  HGB 12.6  HCT 39.8  MCV 78.8*  PLT 223   Basic Metabolic Panel: Recent Labs  Lab 05/23/23 1406  NA 142  K 3.6  CL 109  CO2 24  GLUCOSE 96  BUN 13  CREATININE 0.80  CALCIUM 9.7   GFR: Estimated Creatinine Clearance: 77.1 mL/min (by C-G formula based on SCr of 0.8 mg/dL). Liver Function Tests: Recent Labs  Lab 05/23/23 1406  AST 19  ALT 16  ALKPHOS 102  BILITOT 0.3  PROT 7.0  ALBUMIN 4.1   No results for input(s): "LIPASE", "AMYLASE" in the last 168 hours. No results for input(s): "AMMONIA" in the last 168 hours. Coagulation Profile: Recent Labs  Lab 05/23/23 1521  INR 1.1   Cardiac Enzymes: No results for input(s): "CKTOTAL", "CKMB", "CKMBINDEX", "TROPONINI" in the last 168 hours. BNP (last 3  results) No results for input(s): "PROBNP" in the last 8760 hours. HbA1C: No results for input(s): "HGBA1C" in the last 72 hours. CBG: Recent Labs  Lab 05/23/23 1347  GLUCAP 92   Lipid Profile: No results for input(s): "CHOL", "HDL", "LDLCALC", "TRIG", "CHOLHDL", "LDLDIRECT" in the last 72 hours. Thyroid Function Tests: No results for input(s): "TSH", "T4TOTAL", "FREET4", "T3FREE", "THYROIDAB" in the last 72 hours. Anemia Panel: No results for input(s): "VITAMINB12", "FOLATE", "FERRITIN", "TIBC", "IRON", "RETICCTPCT" in the last 72 hours. Sepsis Labs: Recent Labs  Lab 05/23/23 1500 05/23/23 1607  LATICACIDVEN 0.6 0.7    No results found for this or any previous visit (from the past 240 hours).   Radiology Studies: MR BRAIN WO CONTRAST Result Date: 05/23/2023 CLINICAL DATA:  Seizure EXAM: MRI HEAD WITHOUT CONTRAST TECHNIQUE: Multiplanar, multiecho pulse sequences of the brain and surrounding structures were obtained without intravenous contrast. COMPARISON:  11/05/2015 brain MRI 05/23/2023 CTA head neck FINDINGS: Brain: No acute infarct, mass effect or extra-axial collection. No acute or chronic hemorrhage. There is multifocal hyperintense T2-weighted signal within the white matter. Parenchymal volume and CSF spaces are normal. The midline structures are normal. The hippocampi are normal and symmetric in size and signal. The hypothalamus and mamillary bodies are normal. There is no cortical ectopia or dysplasia. Vascular: Normal flow voids. Skull and upper cervical spine: Normal calvarium and skull base. Visualized upper cervical spine and soft tissues are normal. Sinuses/Orbits:No paranasal sinus fluid levels or advanced mucosal thickening. No mastoid or middle ear effusion. Normal orbits. IMPRESSION: 1. No acute intracranial abnormality. 2. No seizure focus identified. 3. Findings of chronic small vessel ischemia. Electronically Signed   By: Deatra Robinson M.D.   On: 05/23/2023 21:06    DG Shoulder Right Result Date: 05/23/2023 CLINICAL DATA:  R shoulder pain EXAM: RIGHT SHOULDER - 2+ VIEW COMPARISON:  None Available. FINDINGS: There is no evidence of fracture or dislocation. There is no evidence of severe arthropathy or other focal bone abnormality. Soft tissues are unremarkable. Cervical spine surgical hardware noted. IMPRESSION: No acute displaced fracture or dislocation. Electronically Signed   By: Tish Frederickson M.D.   On: 05/23/2023 20:20   CT ANGIO HEAD NECK W WO CM Result Date: 05/23/2023 CLINICAL DATA:  Possible CVA. Seizures. Postictal. Abnormal speech. Bilateral lower extremity weakness. EXAM: CT ANGIOGRAPHY HEAD AND NECK WITH AND WITHOUT CONTRAST TECHNIQUE: Multidetector CT imaging of the head and neck was performed using the standard protocol during bolus administration of intravenous contrast. Multiplanar CT image reconstructions and MIPs were obtained to evaluate the vascular anatomy. Carotid stenosis measurements (when applicable) are obtained utilizing  NASCET criteria, using the distal internal carotid diameter as the denominator. RADIATION DOSE REDUCTION: This exam was performed according to the departmental dose-optimization program which includes automated exposure control, adjustment of the mA and/or kV according to patient size and/or use of iterative reconstruction technique. CONTRAST:  75mL OMNIPAQUE IOHEXOL 350 MG/ML SOLN COMPARISON:  CT head without contrast 09/17/2018 FINDINGS: CT HEAD FINDINGS Brain: No acute infarct, hemorrhage, or mass lesion is present. No significant white matter lesions are present. Deep brain nuclei are within normal limits. The ventricles are of normal size. No significant extraaxial fluid collection is present. The brainstem and cerebellum are within normal limits. A relatively empty sella is present. Midline structures are otherwise within normal limits. Vascular: No hyperdense vessel or unexpected calcification. Skull: Calvarium is  intact. No focal lytic or blastic lesions are present. No significant extracranial soft tissue lesion is present. Sinuses/Orbits: The paranasal sinuses and mastoid air cells are clear. The globes and orbits are within normal limits. Review of the MIP images confirms the above findings CTA NECK FINDINGS Aortic arch: A common origin of the left common carotid artery in the innominate artery is present. No significant atherosclerotic changes are present the aortic arch great vessel origins. No focal stenosis is present. Right carotid system: The right common carotid artery is within normal limits. Mild noncalcified plaque is present along the posterior aspect of the proximal right ICA without significant stenosis relative to the more distal vessel. The cervical right ICA is otherwise normal. Left carotid system: The left common carotid artery is within normal limits. Bifurcation is unremarkable. The cervical left ICA is normal. Vertebral arteries: The right vertebral artery is dominant. Both vertebral arteries originate from the subclavian arteries without significant stenosis. No significant stenosis is present in either vertebral artery in the neck. Skeleton: Cervical spine fusion is noted C4-5 C5-6 and C6-7. Mild right foraminal narrowing is present at C2-3 secondary to uncovertebral and facet spurring. Other neck: The soft tissues of the neck are otherwise unremarkable. Salivary glands are within normal limits. Thyroid is normal. No significant adenopathy is present. No focal mucosal or submucosal lesions are present. Upper chest: The lung apices are clear. The thoracic inlet is within normal limits. Review of the MIP images confirms the above findings CTA HEAD FINDINGS Anterior circulation: The internal carotid arteries are within normal limits from the skull base to the ICA termini. The left A1 segment is dominant. An azygous A2 segment is present. The MCA bifurcations are normal bilaterally. The ACA and MCA  branch vessels are normal bilaterally. No aneurysm is present. Posterior circulation: PICA origins are visualized and normal. The vertebrobasilar junction basilar artery normal. The superior cerebellar arteries are patent. Both posterior cerebral arteries originate from the basilar tip. The PCA branch vessels are normal bilaterally. Venous sinuses: The dural sinuses are patent. The straight sinus and deep cerebral veins are intact. Cortical veins are within normal limits. No significant vascular malformation is evident. Anatomic variants: None Review of the MIP images confirms the above findings IMPRESSION: 1. Normal noncontrast CT of the head. 2. Mild noncalcified plaque along the posterior aspect of the proximal right ICA without significant stenosis relative to the more distal vessel. 3. Otherwise normal CTA of the neck. 4. Normal CTA Circle of Willis without significant proximal stenosis, aneurysm, or branch vessel occlusion. 5. Cervical spine fusion C4-5 C5-6 and C6-7. 6. Mild right foraminal narrowing at C2-3 secondary to uncovertebral and facet spurring. Electronically Signed   By: Virl Son.D.  On: 05/23/2023 18:25   DG Chest Portable 1 View Result Date: 05/23/2023 CLINICAL DATA:  Chest pain and tightness. EXAM: PORTABLE CHEST 1 VIEW COMPARISON:  CT 06/18/2021.  Radiographs 06/18/2021 and 08/14/2018. FINDINGS: 1421 hours. The heart size and mediastinal contours are stable. Lower lung volumes with mild resulting bibasilar atelectasis. No edema, confluent airspace disease, pleural effusion or pneumothorax. No acute osseous findings are evident status post cervical fusion. Telemetry leads overlie the chest. IMPRESSION: Lower lung volumes with mild bibasilar atelectasis. No acute cardiopulmonary process. Electronically Signed   By: Carey Bullocks M.D.   On: 05/23/2023 15:41    Scheduled Meds:  enoxaparin (LOVENOX) injection  40 mg Subcutaneous Q24H   Continuous Infusions:  sodium  chloride 100 mL/hr at 05/24/23 0511     LOS: 0 days   Hughie Closs, MD [facility]  05/24/2023, 9:40 AM   *Please note that this is a verbal dictation therefore any spelling or grammatical errors are due to the "[employer]agon [employer] One" system interpretation.  Please page via Amion and do not message via secure chat for urgent patient care matters. Secure chat can be used for non urgent patient care matters.  How to contact the [facility] Attending or Consulting provider 7A - 7P or covering provider during after hours 7P -7A, for this patient?  Check the care team in [facility] and look for a) attending/consulting [facility] provider listed and b) the [facility] team listed. Page or secure chat 7A-7P. Log into [email or web address] and use [place]'s universal password to access. If you do not have the password, please contact the hospital operator. Locate the [facility] provider you are looking for under [facility] and page to a number that you can be directly reached. If you still have difficulty reaching the provider, please page the [facility] (Director on Call) for the Hospitalists listed on amion for assistance.

## 2023-05-24 NOTE — Evaluation (Addendum)
 Physical Therapy Evaluation Patient Details Name: Heather Figueroa MRN: [id number] DOB: 1970/07/25 Today's Date: 05/24/2023  History of Present Illness  Heather Figueroa is a 53 y.o. female presented to the ED via EMS from work for evaluation of seizure and chest pain.  Patient reportedly had a seizure while at work ; on EMS arrival, patient was postictal.  She had difficulty moving her lower extremities and difficulty speaking; EEG ordered; with medical history significant of hypertension, history of functional neurologic disorder with gait impairment, seizures, CVA/TIA, anxiety/depression, chronic constipation, GERD, history of PFO, prediabetes, tobacco abuse  Clinical Impression   Pt admitted with above diagnosis. 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 Pt will benefit from skilled PT to increase their independence and safety with mobility to allow discharge to the venue listed below.           If plan is discharge home, recommend the following: A little help with walking and/or transfers;A little help with  bathing/dressing/bathroom   Can travel by private vehicle        Equipment Recommendations Rolling walker (2 wheels);BSC/3in1  Recommendations for Other Services  OT consult (and noted MD is considering psych consult)    Functional Status Assessment Patient has had a recent decline in their functional status and demonstrates the ability to make significant improvements in function in a reasonable and predictable amount of time.     Precautions / Restrictions Precautions Precautions: Fall Restrictions Weight Bearing Restrictions Per Provider Order: No      Mobility  Bed Mobility Overal bed mobility: Needs Assistance Bed Mobility: Supine to Sit, Sit to Supine     Supine to sit: Mod assist Sit to supine: Mod assist   General bed mobility comments: heavy mod assist and use of bed pad to assist LEs off of EOB and elevate trunk to sit; Light mod assist to help LEs back into bed    Transfers Overall transfer level: Needs assistance Equipment used: Rolling walker (2 wheels) Transfers: Sit to/from Stand Sit to Stand: Mod assist           General transfer comment: Slow rise to standing; tending to lean R, multimodal cues to extend trunk and hips for fully upright standing; able to stay in fully upright standing about 10 seconds before needing to sit back down to the recliner    Ambulation/Gait                  Stairs            Wheelchair Mobility     Tilt Bed    Modified Rankin (Stroke  Patients Only) Modified Rankin (Stroke Patients Only) Pre-Morbid Rankin Score: No symptoms Modified Rankin: Moderately severe disability     Balance Overall balance assessment: Needs assistance   Sitting balance-Leahy Scale: Fair       Standing balance-Leahy Scale: Poor Standing balance comment: dependent on UE support on RW                             Pertinent Vitals/Pain Pain Assessment Pain Assessment: Faces Faces Pain Scale: Hurts whole  lot Pain Location: Posterior neck, across shoulders and upper arms Pain Descriptors / Indicators: Aching, Constant, Grimacing Pain Intervention(s): Repositioned, Other (comment) (RN had been following up with Dr. Jacqulyn Bath)    Home Living Family/patient expects to be discharged to:: Private residence Living Arrangements: Children (son) Available Help at Discharge: Family;Available PRN/intermittently Type of Home: House Home Access: Stairs to enter Entrance Stairs-Rails: None Entrance Stairs-Number of Steps: 1-2   Home Layout: One level Home Equipment: Educational psychologist (4 wheels) (reports that she has equipment listed "somewhere" but wasn't needing it) Additional Comments: Pt works part-time at [employer] Prior Level of Function : Independent/Modified Independent                     Extremity/Trunk Assessment   Upper Extremity Assessment Upper Extremity Assessment: Defer to OT evaluation;Left hand dominant (Noting decr grip strength compared to R when directly comparing; Able to hold and manage her phone in L hand while supine on stetcher)    Lower Extremity Assessment Lower Extremity Assessment: Generalized weakness;RLE deficits/detail;LLE deficits/detail RLE Deficits / Details: Examined LE strength funciotnally and with pt sitting EOB; TEnds to hold knee in extension sitting edge of stretcher; Able to extend R knee against gravity, though not to full extension; Gave resistence to test quad strength and pt able to hold against moderate resistence; Able to push sit to stand from stretcher with UE support on RW LLE Deficits / Details: Demonstrating more strength LLE than RLE, specifically L knee; Can extend L knee full range against gravity, and able to give resistence to quads at full extension; help quad contraction against maximal resistence    Cervical / Trunk Assessment Cervical / Trunk Assessment: Other exceptions Cervical / Trunk Exceptions: pain at  posterior neck and down shoulders and upper arms  Communication   Communication Communication: Impaired Factors Affecting Communication: Reduced clarity of speech;Difficulty expressing self    Cognition Arousal: Alert Behavior During Therapy: Flat affect   PT - Cognitive impairments: No family/caregiver present to determine baseline                       PT - Cognition Comments: Participating fully Following commands: Impaired Following commands impaired: Follows one step commands with increased time     Cueing Cueing Techniques: Verbal cues, Tactile cues     General Comments General comments (skin integrity, edema, etc.): VSS on room air; During session, pt had her phone on and was FaceTiming with a friend    Exercises     Assessment/Plan    PT Assessment Patient needs continued PT services  PT Problem List Decreased strength;Decreased activity tolerance;Decreased range of motion;Decreased balance;Decreased mobility;Decreased cognition;Decreased coordination;Decreased knowledge of use of DME;Decreased safety awareness;Pain;Decreased knowledge of precautions       PT Treatment Interventions DME instruction;Gait training;Stair training;Functional mobility training;Therapeutic activities;Balance training;Therapeutic exercise;Neuromuscular re-education;Cognitive remediation;Patient/family education    PT Goals (Current goals can be found  in the Care Plan section)  Acute Rehab PT Goals Patient Stated Goal: Did not state, but agreeing to get sitting up PT Goal Formulation: Patient unable to participate in goal setting Time For Goal Achievement: 06/07/23 Potential to Achieve Goals: Good    Frequency Min 2X/week     Co-evaluation               AM-PAC PT "6 Clicks" Mobility  Outcome Measure Help needed turning from your back to your side while in a flat bed without using bedrails?: A Little Help needed moving from lying on your back to sitting on the side of  a flat bed without using bedrails?: A Lot Help needed moving to and from a bed to a chair (including a wheelchair)?: A Lot Help needed standing up from a chair using your arms (e.g., wheelchair or bedside chair)?: A Lot Help needed to walk in hospital room?: A Lot Help needed climbing 3-5 steps with a railing? : A Lot 6 Click Score: 13    End of Session Equipment Utilized During Treatment: Gait belt Activity Tolerance: Patient limited by pain Patient left: with call bell/phone within reach (back to stretcher, rails up) Nurse Communication: Mobility status PT Visit Diagnosis: Unsteadiness on feet (R26.81);Other abnormalities of gait and mobility (R26.89);Muscle weakness (generalized) (M62.81)    Time: 1610-9604 PT Time Calculation (min) (ACUTE ONLY): 31 min   Charges:   PT Evaluation $PT Eval Moderate Complexity: 1 Mod PT Treatments $Therapeutic Activity: 8-22 mins PT General Charges $$ ACUTE PT VISIT: 1 Visit         Van Clines, PT  Acute Rehabilitation Services Office [phone] Secure Chat welcomed   Levi Aland 05/24/2023, 12:22 PM

## 2023-05-24 NOTE — Evaluation (Signed)
 Occupational Therapy Evaluation Patient Details Name: Heather Figueroa MRN: [id number] DOB: [birth year] Today's Date: 05/24/2023   History of Present Illness   Heather Figueroa is a 53 y.o. female presented to the ED via EMS from work for evaluation of seizure and chest pain.  Patient reportedly had a seizure while at work ; on EMS arrival, patient was postictal.  She had difficulty moving her lower extremities and difficulty speaking; EEG ordered; with medical history significant of hypertension, history of functional neurologic disorder with gait impairment, seizures, CVA/TIA, anxiety/depression, chronic constipation, GERD, history of PFO, prediabetes, tobacco abuse     Clinical Impressions Pt currently at mod assist level overall for simulated selfcare sit to stand and for step pivot transfers without assistive device.  Movements are slower than normal with inconsistencies noted such as faster movement in the UEs and hands when given sock and asked to donn vs being much slower when asked to complete AROM for MMT.  BP in sitting 129/70 with HR in the low 60s.  Oxygen sats at 100% on room air.  Pt with decreased 3 word recall but able to state place, day, month, year, and day of the month.  Prior to admission pt was living with her son who she reports works and the patient was working part-time at [employer].  Feel she will benefit from acute care OT at this time in order to progress current dependency with self care to a more independent level.  Pt will need initial 24 hour supervision post acute stay.  Since her son works, recommend continued inpatient follow up therapy, <3 hours/day for continued progress.       If plan is discharge home, recommend the following:   A little help with walking and/or transfers;A little help with bathing/dressing/bathroom;Assist for transportation;Supervision due to cognitive status;Assistance with cooking/housework;Help with stairs or ramp for entrance      Functional Status Assessment   Patient has had a recent decline in their functional status and demonstrates the ability to make significant improvements in function in a reasonable and predictable amount of time.     Equipment Recommendations   BSC/3in1      Precautions/Restrictions   Precautions Precautions: Fall Restrictions Weight Bearing Restrictions Per Provider Order: No     Mobility Bed Mobility Overal bed mobility: Needs Assistance Bed Mobility: Supine to Sit, Sit to Supine     Supine to sit: Mod assist Sit to supine: Min assist   General bed mobility comments: Assist with bringing LEs off of the bed and transitioning to sitting.    Transfers Overall transfer level: Needs assistance   Transfers: Sit to/from Stand, Bed to chair/wheelchair/BSC Sit to Stand: Mod assist     Step pivot transfers: Mod assist     General transfer comment: Mod assist for upright posture in head and trunk.  Decreased speed of movement and ability to weightshift LEs for taking efficient steps without assistive device      Balance Overall balance assessment: Needs assistance Sitting-balance support: Feet unsupported Sitting balance-Leahy Scale: Fair     Standing balance support: Bilateral upper extremity supported, During functional activity Standing balance-Leahy Scale: Poor Standing balance comment: Pt needs UE support for balance                           ADL either performed or assessed with clinical judgement   ADL Overall ADL's : Needs assistance/impaired Eating/Feeding: Supervision/ safety;Sitting   Grooming: Wash/dry hands;Wash/dry  face;Set up;Sitting   Upper Body Bathing: Sitting   Lower Body Bathing: Sit to/from stand;Moderate assistance   Upper Body Dressing : Set up;Sitting   Lower Body Dressing: Sit to/from stand;Moderate assistance   Toilet Transfer: Stand-pivot;Moderate assistance   Toileting- Clothing Manipulation and Hygiene: Sit  to/from stand;Moderate assistance       Functional mobility during ADLs: Moderate assistance General ADL Comments: Pt with slow movements and dysarthric speech pattern.  Decreased ability to move her LEs efficiently for step pivot on first attempt, but this improved on transfer from chair back the bed.  Pt reports pain in her RUE and RLE when asked.  She lives with her son, who she states works.  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     Vision Baseline Vision/History: 1 Wears glasses Additional Comments: Pt closing eyes at time.  Did not formally assess vision, will look at in treatment.  Do not suspect changes from baseline.     Perception Perception: Within Functional Limits       Praxis Praxis: Impaired Praxis Impairment Details: Ideomotor Praxis-Other Comments: Delayed movement to command when asked   Pertinent Vitals/Pain Pain Assessment Pain Assessment: Faces Faces Pain Scale: Hurts little more Pain Location: right leg and arm Pain Descriptors / Indicators: Discomfort Pain Intervention(s): Limited activity within patient's tolerance, Repositioned     Extremity/Trunk Assessment Upper Extremity Assessment Upper Extremity Assessment: Generalized weakness;RUE deficits/detail RUE Deficits / Details: Inconsistent results with testing. Pt demonstrates slow movements to command when asked to flex her arm or raise it up, however with functional use movement is faster such as when donning socks. Asked pt to flex her arm actively on the right and she needed some self assist with the LUE.  However, when told to hold arm in flexed position and therapist attemtpted to straighten it, she exhibited strength a 4/5,  All active movements are slow to complete. RUE Coordination: decreased fine motor;decreased gross motor   Lower Extremity Assessment Lower Extremity Assessment: Defer to PT evaluation RLE Deficits / Details: Examined LE strength funciotnally and with pt  sitting EOB; TEnds to hold knee in extension sitting edge of stretcher; Able to extend R knee against gravity, though not to full extension; Gave resistence to test quad strength and pt able to hold against moderate resistence; Able to push sit to stand from stretcher with UE support on RW LLE Deficits / Details: Demonstrating more strength LLE than RLE, specifically L knee; Can extend L knee full range against gravity, and able to give resistence to quads at full extension; help quad contraction against maximal resistence   Cervical / Trunk Assessment Cervical / Trunk Assessment: Other exceptions Cervical / Trunk Exceptions: Flexed neck in sitting/standing   Communication Communication Communication: Impaired Factors Affecting Communication: Reduced clarity of speech;Difficulty expressing self   Cognition Arousal: Lethargic Behavior During Therapy: Flat affect Cognition: No family/caregiver present to determine baseline             OT - Cognition Comments: Pt able to recall 1/3 words after approximately 2 min delay.  Oriented to place and time.  Slow dysarthric speech noted throughout.                 Following commands: Impaired Following commands impaired: Follows one step commands with increased time     Cueing  General Comments   Cueing Techniques: Verbal cues;Tactile cues  VSS on room air; During session, pt had her phone on and was FaceTiming with  a friend           Home Living Family/patient expects to be discharged to:: Private residence Living Arrangements: Children (son) Available Help at Discharge: Family;Available PRN/intermittently Type of Home: House Home Access: Stairs to enter [employer] of Steps: 1-2 Entrance Stairs-Rails: None Home Layout: One level     Bathroom Shower/Tub: Tub/shower unit;Curtain   Firefighter: Standard Bathroom Accessibility: Yes   Home Equipment: Educational psychologist (4 wheels) (reports that she has  equipment listed "somewhere" but wasn't needing it)   Additional Comments: Pt works part-time at [employer]      Prior Functioning/Environment Prior Level of Function : Independent/Modified Independent                    OT Problem List: Decreased strength;Decreased range of motion;Decreased activity tolerance;Impaired balance (sitting and/or standing);Decreased coordination;Decreased knowledge of use of DME or AE;Pain   OT Treatment/Interventions: Self-care/ADL training;Balance training;Therapeutic exercise;Neuromuscular education;Therapeutic activities;DME and/or AE instruction;Patient/family education;Cognitive remediation/compensation      OT Goals(Current goals can be found in the care plan section)   Acute Rehab OT Goals Patient Stated Goal: Pt did not state this session OT Goal Formulation: With patient Time For Goal Achievement: 06/07/23 Potential to Achieve Goals: Good   OT Frequency:  Min 2X/week       AM-PAC OT "6 Clicks" Daily Activity     Outcome Measure Help from another person eating meals?: None Help from another person taking care of personal grooming?: A Little Help from another person toileting, which includes using toliet, bedpan, or urinal?: A Lot Help from another person bathing (including washing, rinsing, drying)?: A Lot Help from another person to put on and taking off regular upper body clothing?: A Little Help from another person to put on and taking off regular lower body clothing?: A Lot 6 Click Score: 16   End of Session Equipment Utilized During Treatment: Gait belt Nurse Communication: Mobility status  Activity Tolerance: Patient tolerated treatment well Patient left: in bed;with call bell/phone within reach  OT Visit Diagnosis: Unsteadiness on feet (R26.81);Other abnormalities of gait and mobility (R26.89);Muscle weakness (generalized) (M62.81);Cognitive communication deficit (R41.841);Pain Pain - Right/Left: Right Pain - part of  body: Leg;Arm                Time: 1004-1040 OT Time Calculation (min): 36 min Charges:  OT General Charges $OT Visit: 1 Visit OT Evaluation $OT Eval Moderate Complexity: 1 Mod OT Treatments $Self Care/Home Management : 8-22 mins  Perrin Maltese, OTR/L Acute Rehabilitation Services  Office [phone] 05/24/2023

## 2023-05-24 NOTE — ED Notes (Signed)
 Called and placed PT on monitor with CCMD.

## 2023-05-24 NOTE — Progress Notes (Signed)
 EEG complete - results pending

## 2023-05-24 NOTE — Evaluation (Signed)
 Clinical/Bedside Swallow Evaluation Patient Details  Name: MARKEIA HARKLESS MRN: [id number] Date of Birth: 1970/10/28  Today's Date: 05/24/2023 Time: SLP Start Time (ACUTE ONLY): 1450 SLP Stop Time (ACUTE ONLY): 1503 SLP Time Calculation (min) (ACUTE ONLY): 13 min  Past Medical History:  Past Medical History:  Diagnosis Date   Anemia    Anxiety    Arthritis    Depression    Headache    Hypertension    Neuromuscular disorder (HCC)    Seizures (HCC)    pt has had 2 seizures in July 2018   Stroke [facility])    possible TIA in July 2018   Past Surgical History:  Past Surgical History:  Procedure Laterality Date   ABDOMINAL HYSTERECTOMY     2002   ANTERIOR CERVICAL DECOMP/DISCECTOMY FUSION N/A 01/26/2017   Procedure: ANTERIOR CERVICAL DECOMPRESSION/DISCECTOMY FUSION, INTERBODY PROSTHESIS, PLATE/SCREWS, POSSIBLE CORPECTOMY CERVICAL FOUR- CERVICAL FIVE, CERVICAL FIVE- CERVICAL SIX, CERVICAL SIX- CERVICAL SEVEN;  Surgeon: Tressie Stalker, MD;  Location: [facility] OR;  Service: Neurosurgery;  Laterality: N/A;  ANTERIOR CERVICAL DECOMPRESSION/DISCECTOMY FUSION, INTERBODY PROSTHESIS, PLATE/SCREWS, POSSIBLE CORPE   APPENDECTOMY     2010   COLONOSCOPY N/A 04/05/2022   Procedure: COLONOSCOPY;  Surgeon: Jaynie Collins, DO;  Location[facility] ENDOSCOPY;  Service: Gastroenterology;  Laterality: N/A;   ESOPHAGOGASTRODUODENOSCOPY N/A 04/05/2022   Procedure: ESOPHAGOGASTRODUODENOSCOPY (EGD);  Surgeon: Jaynie Collins, DO;  Location: [facility] ENDOSCOPY;  Service: Gastroenterology;  Laterality: N/A;   OVARIAN CYST REMOVAL     TUBAL LIGATION     1998   HPI:  ALMEDA EZRA is a 53 y.o. female who presented to the ED via EMS from work for evaluation of seizure and chest pain. Patient reportedly had a seizure while at work; upon EMS arrival, patient was postictal.  She had difficulty moving her lower extremities and difficulty speaking; EEG and MRI normal.  PMHx significant for hypertension, history of  functional neurologic disorder with gait impairment, seizures, CVA/TIA, anxiety/depression, chronic constipation, GERD, history of PFO, prediabetes, tobacco abuse    Assessment / Plan / Recommendation  Clinical Impression  Pt presents with normal oropharyngeal swallowing with thorough mastication, the appearance of a brisk swallow response, and no s/s of aspiration.  No focal CN deficits. Oral mechanism normal.  Continue regular diet with thin liquids- SLP will sign off. SLP Visit Diagnosis: Dysphagia, unspecified (R13.10)    Aspiration Risk  No limitations    Diet Recommendation   Thin;Age appropriate regular  Medication Administration: Whole meds with liquid    Other  Recommendations Oral Care Recommendations: Oral care BID    Recommendations for follow up therapy are one component of a multi-disciplinary discharge planning process, led by the attending physician.  Recommendations may be updated based on patient status, additional functional criteria and insurance authorization.  Follow up Recommendations No SLP follow up      Assistance Recommended at Discharge PRN  Functional Status Assessment    Frequency and Duration min 2x/week                  Swallow Study   General HPI: RIYANNA CRUTCHLEY is a 53 y.o. female who presented to the ED via EMS from work for evaluation of seizure and chest pain. Patient reportedly had a seizure while at work; upon EMS arrival, patient was postictal.  She had difficulty moving her lower extremities and difficulty speaking; EEG ordered. PMHx significant for hypertension, history of functional neurologic disorder with gait impairment, seizures, CVA/TIA, anxiety/depression, chronic constipation, GERD,  history of PFO, prediabetes, tobacco abuse Type of Study: Bedside Swallow Evaluation Previous Swallow Assessment: no Diet Prior to this Study: Regular;Thin liquids (Level 0) Temperature Spikes Noted: No Respiratory Status: Room air History of  Recent Intubation: No Behavior/Cognition: Alert;Cooperative;Pleasant mood Oral Cavity Assessment: Within Functional Limits Oral Care Completed by SLP: No Oral Cavity - Dentition: Adequate natural dentition Vision: Functional for self-feeding Self-Feeding Abilities: Able to feed self Patient Positioning: Upright in bed Baseline Vocal Quality: Normal Volitional Cough: Strong Volitional Swallow: Able to elicit    Oral/Motor/Sensory Function Overall Oral Motor/Sensory Function: Within functional limits   Ice Chips Ice chips: Within functional limits   Thin Liquid Thin Liquid: Within functional limits    Nectar Thick Nectar Thick Liquid: Not tested   Honey Thick Honey Thick Liquid: Not tested   Puree Puree: Within functional limits   Solid     Solid: Within functional limits      Blenda Mounts Laurice 05/24/2023,3:46 PM   Marchelle Folks L. Samson Frederic, MA CCC/SLP Clinical Specialist - Acute Care SLP Acute Rehabilitation Services Office number [phone]

## 2023-05-24 NOTE — Procedures (Signed)
 Patient Name: Heather Figueroa  MRN: [id number]  Epilepsy Attending: Charlsie Quest  Referring Physician/Provider: Milon Dikes, MD  Date: 05/24/2023 Duration: 22.52 mins  Patient history: 53 yo F patient who has a history of functional neurological disorder with gait impairment, history of seizures coming in with concern for seizure. EEG to evaluate for seizure  Level of alertness: Awake, asleep  AEDs during EEG study: LTG, VPA  Technical aspects: This EEG study was done with scalp electrodes positioned according to the 10-20 International system of electrode placement. Electrical activity was reviewed with band pass filter of 1-70Hz , sensitivity of 7 uV/mm, display speed of 4mm/sec with a 60Hz  notched filter applied as appropriate. EEG data were recorded continuously and digitally stored.  Video monitoring was available and reviewed as appropriate.  Description: The posterior dominant rhythm consists of 8Hz  activity of moderate voltage (25-35 uV) seen predominantly in posterior head regions, symmetric and reactive to eye opening and eye closing. Sleep was characterized by vertex waves, sleep spindles (12 to 14 Hz), maximal frontocentral region. Hyperventilation and photic stimulation were not performed.     IMPRESSION: This study is within normal limits. No seizures or epileptiform discharges were seen throughout the recording.  A normal interictal EEG does not exclude the diagnosis of epilepsy.  Stacie Knutzen Annabelle Harman

## 2023-05-25 DIAGNOSIS — R569 Unspecified convulsions: Secondary | ICD-10-CM | POA: Diagnosis not present

## 2023-05-25 MED ORDER — ENSURE ENLIVE PO LIQD
237.0000 mL | Freq: Two times a day (BID) | ORAL | Status: DC
  Administered 2023-05-26 (×2): 237 mL via ORAL

## 2023-05-25 MED ORDER — KETOROLAC TROMETHAMINE 15 MG/ML IJ SOLN
15.0000 mg | Freq: Once | INTRAMUSCULAR | Status: AC | PRN
  Administered 2023-05-25: 15 mg via INTRAVENOUS
  Filled 2023-05-25: qty 1

## 2023-05-25 NOTE — Progress Notes (Signed)
 Physical Therapy Treatment Patient Details Name: Heather Figueroa MRN: [id number] DOB: 09/03/1970 Today's Date: 05/25/2023   History of Present Illness Heather Figueroa is a 53 y.o. female presented to the ED via EMS from work for evaluation of seizure and chest pain.  Patient reportedly had a seizure while at work ; on EMS arrival, patient was postictal.  She had difficulty moving her lower extremities and difficulty speaking; EEG ordered; with medical history significant of hypertension, history of functional neurologic disorder with gait impairment, seizures, CVA/TIA, anxiety/depression, chronic constipation, GERD, history of PFO, prediabetes, tobacco abuse   PT Comments  Pt greeted supine in bed, pleasant and agreeable to PT session. She took increased time to perform all functional mobility with moderate encouragement. Pt demonstrated improve standing tolerance maintaining upright for ~42mins before requiring a seated rest break. She engaged in standing marches and side stepping to left using RW with modA x2. Pt was able to easily advance LLE, but had difficulty sequencing RLE and displayed inconsistent performance on RLE. Patient will benefit from continued inpatient follow up therapy, <3 hours/day.   If plan is discharge home, recommend the following: Two people to help with walking and/or transfers;A lot of help with bathing/dressing/bathroom;Assistance with cooking/housework;Assist for transportation;Help with stairs or ramp for entrance   Can travel by private vehicle        Equipment Recommendations  Wheelchair (measurements PT);Wheelchair cushion (measurements PT);BSC/3in1;Rolling walker (2 wheels)    Recommendations for Other Services       Precautions / Restrictions Precautions Precautions: Fall Recall of Precautions/Restrictions: Intact Restrictions Weight Bearing Restrictions Per Provider Order: No     Mobility  Bed Mobility Overal bed mobility: Needs Assistance Bed  Mobility: Supine to Sit, Sit to Supine     Supine to sit: Mod assist, HOB elevated, Used rails Sit to supine: Min assist   General bed mobility comments: Pt sat up on L side of bed. VC for sequencing with pt beging to bring BLE off EOB with minA and reach for bedrail with minA at shoulder. She required modA to achieve upright posture and to scoot fwd using bed pad until feet flat on floor. Returning to bed pt required minA to bring BLE into bed. Repositioned using bed features and bed pad, +2 assist.    Transfers Overall transfer level: Needs assistance Equipment used: Rolling walker (2 wheels) Transfers: Sit to/from Stand Sit to Stand: Mod assist           General transfer comment: Pt stood from lowest bed height twice with modA to power up. VC for proper hand positioning and technique. Pt continued to maintain  BUE on RW despite education to increase safety awarness. Pt took increased time to achieve upright posture. Good eccentric control with sitting.    Ambulation/Gait Ambulation/Gait assistance: Mod assist, +2 physical assistance, +2 safety/equipment Gait Distance (Feet): 2 Feet Assistive device: Rolling walker (2 wheels)       Pre-gait activities: Standing Marches inside RW: Pt was able to achieve full foot clearence on LLE and minimal lift on RLE. She took increased time to weight shift and required moderate encouragement to participate/attempt. General Gait Details: Pt took a couple of side steps to the L towards the [facility]. She was able to clear LLE from floor. Pt had inconsistent RLE performance initially dragging foot across floor and then later able to achieve adequate floor clearence. She took increased time to do any motion and required modA at trunk for safety and to assist in  advancing RW.   Stairs             Wheelchair Mobility     Tilt Bed    Modified Rankin (Stroke Patients Only)       Balance Overall balance assessment: Needs  assistance Sitting-balance support: Feet supported, Single extremity supported Sitting balance-Leahy Scale: Fair Sitting balance - Comments: Pt sat EOB with supervision.   Standing balance support: Bilateral upper extremity supported, During functional activity, Reliant on assistive device for balance Standing balance-Leahy Scale: Poor Standing balance comment: Pt dependent on RW for stability and modA x2 for safety during side steps.                            Communication Communication Communication: Impaired Factors Affecting Communication: Reduced clarity of speech;Difficulty expressing self  Cognition Arousal: Alert Behavior During Therapy: Flat affect   PT - Cognitive impairments: No family/caregiver present to determine baseline                         Following commands: Impaired Following commands impaired: Follows one step commands with increased time    Cueing Cueing Techniques: Verbal cues, Tactile cues  Exercises      General Comments General comments (skin integrity, edema, etc.): VSS at rest. Pt tachycardic, HRmax 122bpm during side stepping.      Pertinent Vitals/Pain Pain Assessment Pain Assessment: Faces Faces Pain Scale: Hurts little more Pain Location: RUE and RLE Pain Descriptors / Indicators: Pins and needles, Numbness, Discomfort, Heaviness, Sore Pain Intervention(s): Monitored during session, Limited activity within patient's tolerance, Repositioned    Home Living                          Prior Function            PT Goals (current goals can now be found in the care plan section) Acute Rehab PT Goals Patient Stated Goal: Get better without having to use a w/c Progress towards PT goals: Progressing toward goals    Frequency    Min 2X/week      PT Plan      Co-evaluation              AM-PAC PT "6 Clicks" Mobility   Outcome Measure  Help needed turning from your back to your side while in a  flat bed without using bedrails?: A Lot Help needed moving from lying on your back to sitting on the side of a flat bed without using bedrails?: A Lot Help needed moving to and from a bed to a chair (including a wheelchair)?: Total Help needed standing up from a chair using your arms (e.g., wheelchair or bedside chair)?: Total Help needed to walk in hospital room?: Total Help needed climbing 3-5 steps with a railing? : Total 6 Click Score: 8    End of Session Equipment Utilized During Treatment: Gait belt Activity Tolerance: Patient limited by pain Patient left: in bed;with call bell/phone within reach;with bed alarm set Nurse Communication: Mobility status PT Visit Diagnosis: Unsteadiness on feet (R26.81);Other abnormalities of gait and mobility (R26.89);Muscle weakness (generalized) (M62.81)     Time: 1610-9604 PT Time Calculation (min) (ACUTE ONLY): 41 min  Charges:    $Gait Training: 8-22 mins $Therapeutic Activity: 23-37 mins PT General Charges $$ ACUTE PT VISIT: 1 Visit  Cheri Guppy, PT, DPT Acute Rehabilitation Services Office: [phone] Secure Chat Preferred  Richardson Chiquito 05/25/2023, 4:20 PM

## 2023-05-25 NOTE — NC FL2 (Signed)
 [place] MEDICAID FL2 LEVEL OF CARE FORM     IDENTIFICATION  Patient Name: Heather Figueroa Birthdate: 1970/12/26 Sex: female Admission Date (Current Location): 05/23/2023  [facility] and [place] Number:  Producer, television/film/video and Address:  [facility] [place], [address] [place], [place] [address]      Provider Number: [id number]  Attending Physician Name and Address:  Elgergawy, Leana Roe, MD  Relative Name and Phone Number:       Current Level of Care: Hospital Recommended Level of Care: Skilled Nursing Facility Prior Approval Number:    Date Approved/Denied:   PASRR Number: [id number]  Discharge Plan: SNF    Current Diagnoses: Patient Active Problem List   Diagnosis Date Noted   Prediabetes 05/24/2023   Essential hypertension 05/24/2023   Anxiety and depression 05/24/2023   Seizure (HCC) 05/23/2023   ASD (atrial septal defect): Possible 06/20/2021   Tobacco abuse 06/20/2021   Hypokalemia 06/19/2021   Chest pain 06/18/2021   Seizure disorder (HCC) 06/18/2021   Well woman exam with routine gynecological exam 01/16/2019   Cervical spondylosis with myelopathy and radiculopathy 01/26/2017    Orientation RESPIRATION BLADDER Height & Weight     Self, Time, Situation, Place  Normal Continent Weight: 165 lb 5.5 oz (75 kg) Height:  5\' 1"  (154.9 cm)  BEHAVIORAL SYMPTOMS/MOOD NEUROLOGICAL BOWEL NUTRITION STATUS      Continent Diet (See dc summary)  AMBULATORY STATUS COMMUNICATION OF NEEDS Skin   Limited Assist Verbally Normal                       Personal Care Assistance Level of Assistance  Bathing, Feeding, Dressing Bathing Assistance: Limited assistance Feeding assistance: Independent Dressing Assistance: Limited assistance     Functional Limitations Info             SPECIAL CARE FACTORS FREQUENCY  PT (By licensed PT), OT (By licensed OT)     PT Frequency: eval and treat OT Frequency: eval and treat             Contractures Contractures Info: Not present    Additional Factors Info  Code Status, Allergies, Psychotropic Code Status Info: Full Allergies Info: Pentazocine, Percocet (Oxycodone-acetaminophen), Tylenol (Acetaminophen) Psychotropic Info: see dc summary         Current Medications (05/25/2023):  This is the current hospital active medication list Current Facility-Administered Medications  Medication Dose Route Frequency Provider Last Rate Last Admin   acetaminophen (TYLENOL) tablet 650 mg  650 mg Oral Q6H PRN Pahwani, Ravi, MD       amLODipine (NORVASC) tablet 5 mg  5 mg Oral Q1200 Pahwani, Ravi, MD   5 mg at 05/25/23 1200   enoxaparin (LOVENOX) injection 40 mg  40 mg Subcutaneous Q24H John Giovanni, MD   40 mg at 05/24/23 1612   lamoTRIgine (LAMICTAL) tablet 100 mg  100 mg Oral Daily Hughie Closs, MD   100 mg at 05/25/23 2536   linaclotide (LINZESS) capsule 72 mcg  72 mcg Oral QAC breakfast Hughie Closs, MD   72 mcg at 05/25/23 0823   metoprolol succinate (TOPROL-XL) 24 hr tablet 25 mg  25 mg Oral Q2200 Pahwani, Daleen Bo, MD   25 mg at 05/24/23 2244   naloxone (NARCAN) injection 0.4 mg  0.4 mg Intravenous PRN John Giovanni, MD       ondansetron [facility]) injection 4 mg  4 mg Intravenous Q6H PRN Hughie Closs, MD   4 mg at 05/24/23 1621  oxyCODONE (Oxy IR/ROXICODONE) immediate release tablet 5 mg  5 mg Oral Q6H PRN Hughie Closs, MD   5 mg at 05/24/23 1120   pantoprazole (PROTONIX) EC tablet 40 mg  40 mg Oral q morning Hughie Closs, MD   40 mg at 05/25/23 1610   QUEtiapine (SEROQUEL) tablet 300 mg  300 mg Oral QHS Pahwani, Daleen Bo, MD   300 mg at 05/24/23 2244   traZODone (DESYREL) tablet 150 mg  150 mg Oral QHS Pahwani, Daleen Bo, MD   150 mg at 05/24/23 2244   venlafaxine XR (EFFEXOR-XR) 24 hr capsule 150 mg  150 mg Oral Q breakfast Hughie Closs, MD   150 mg at 05/25/23 9604     Discharge Medications: Please see discharge summary for a list of discharge  medications.  Relevant Imaging Results:  Relevant Lab Results:   Additional Information SSN: [id number] [place], [place]

## 2023-05-25 NOTE — TOC Initial Note (Signed)
 Transition of Care [facility]) - Initial/Assessment Note    Patient Details  Name: Heather Figueroa MRN: [id number] Date of Birth: March 06, 1970  Transition of [facility]) CM/SW Contact:    Mearl Latin, LCSW Phone Number: 05/25/2023, 4:38 PM  Clinical Narrative:                 CSW received consult from therapy that patient did not progress today and requires SNF placement. MD cancelled discharge. CSW unaware of any SNFs accepting [facility] but [facility] are reviewing.    Barriers to Discharge: SNF Pending bed offer, Insurance Authorization   Patient Goals and CMS Choice            Expected Discharge Plan and Services In-house Referral: Clinical Social Work       Expected Discharge Date: 05/25/23                                    Prior Living Arrangements/Services     Patient language and need for interpreter reviewed:: Yes              Criminal Activity/Legal Involvement Pertinent to Current Situation/Hospitalization: No - Comment as needed  Activities of Daily Living   ADL Screening (condition at time of admission) Independently performs ADLs?: No Does the patient have a NEW difficulty with bathing/dressing/toileting/self-feeding that is expected to last >3 days?: No (needs assist) Does the patient have a NEW difficulty with getting in/out of bed, walking, or climbing stairs that is expected to last >3 days?: No (needs assist) Does the patient have a NEW difficulty with communication that is expected to last >3 days?: Yes (Initiates electronic notice to provider for possible SLP consult) (slurred speech) Is the patient deaf or have difficulty hearing?: No Does the patient have difficulty seeing, even when wearing glasses/contacts?: Yes Does the patient have difficulty concentrating, remembering, or making decisions?: Yes  Permission Sought/Granted                  Emotional Assessment       Orientation: : Oriented to Place, Oriented to   Time, Oriented to Self, Oriented to Situation Alcohol / Substance Use: Not Applicable Psych Involvement: No (comment)  Admission diagnosis:  Seizure (HCC) [R56.9] Chest pain, unspecified type [R07.9] Patient Active Problem List   Diagnosis Date Noted   Prediabetes 05/24/2023   Essential hypertension 05/24/2023   Anxiety and depression 05/24/2023   Seizure (HCC) 05/23/2023   ASD (atrial septal defect): Possible 06/20/2021   Tobacco abuse 06/20/2021   Hypokalemia 06/19/2021   Chest pain 06/18/2021   Seizure disorder (HCC) 06/18/2021   Well woman exam with routine gynecological exam 01/16/2019   Cervical spondylosis with myelopathy and radiculopathy 01/26/2017   PCP:  Kandyce Rud, MD Pharmacy:   [facility] [address] [place] - [address] [place]) [place] [address] Phone: [phone] Fax: [phone]     Social Drivers of Health (SDOH) Social History: SDOH Screenings   Food Insecurity: No Food Insecurity (05/24/2023)  Housing: Low Risk  (05/24/2023)  Transportation Needs: No Transportation Needs (05/24/2023)  Utilities: At Risk (05/24/2023)  Financial Resource Strain: High Risk (11/09/2022)   Received from [facility]  Tobacco Use: High Risk (05/23/2023)   SDOH Interventions: Utilities Interventions: Inpatient TOC   Readmission Risk Interventions     No data to display

## 2023-05-25 NOTE — Progress Notes (Signed)
 PROGRESS NOTE    Heather Figueroa  UUV:[id number] DOB: 1970/07/04 DOA: 05/23/2023 PCP: Kandyce Rud, MD   Brief Narrative:   HPI: Heather Figueroa is a 53 y.o. female with medical history significant of hypertension, history of functional neurologic disorder with gait impairment, seizures, CVA/TIA, anxiety/depression, chronic constipation, GERD, history of PFO, prediabetes, tobacco abuse presented to the ED via EMS from work for evaluation of seizure and chest pain.  Patient reportedly had a seizure while at work yesterday afternoon.  On EMS arrival, patient was postictal.  She had difficulty moving her lower extremities and difficulty speaking.  Patient reported compliance with her Lamictal.  Vital signs on arrival: Temperature 97.6 F, pulse 74, respiratory rate 18, blood pressure 145/86, and SpO2 100% on room air.  No significant acute abnormalities on CBC and CMP.  Troponin negative x 2, lactate normal x 2, ethanol level <10, UA not suggestive of infection.  Chest x-ray showing no acute cardiopulmonary process.  CTA head and neck negative for acute findings.  Brain MRI negative for acute intracranial abnormality.  X-ray of right shoulder negative for acute displaced fracture or dislocation.  Patient was given Dilaudid and IV Keppra 1500 mg in the ED.  She continued to have persistent weakness of her right upper extremity, left lower extremity, left facial weakness, and expressive aphasia in the ED.  Neurology felt that her symptoms were suspicious for a behavioral episode.  Recommended EEG and if abnormal then formally consult neurology again.  If EEG negative then likely functional neurologic disorder which requires evaluation by psychiatry.  For now, neurology recommending continuing her home Lamictal and outpatient follow-up with neurology to see if Keppra needs to be added to her medication regimen.  Reportedly patient has not followed up with outpatient neurology since 2019 and will need a new  referral.   History very limited due to patient having expressive aphasia.  She reports having a seizure at work yesterday.  Also reporting substernal/right-sided chest tightness which started at work yesterday.  Reports taking her home Lamictal every day except missed a dose 3 days ago.  She is not able to give any additional history at this time.  Assessment & Plan:   Principal Problem:   Seizure [facility]) Active Problems:   Chest pain   Prediabetes   Essential hypertension   Anxiety and depression   Nonepileptiform seizures -Patient with history of functional neurologic disorder with gait impairment and also history of seizure disorder on Lamictal presented after she had a witnessed seizure at work.   -.Deceived IV Keppra in ED, no recurrence of seizures during hospital stay s -MRI brain with no acute findings, EEG with no acute findings. -Concern for functional neurological disorder . -Physical exam is inconsistent, did request neurology to evaluate . -Continue with Lamictal  -Patient reports she is following with a psychologist as an outpatient [name] as an outpatient for last 4 years.  Chest pain/neck pain Chest x-ray showing no acute cardiopulmonary process.  Workup not suggestive of ACS.  D-dimer negative.  Thromboembolism ruled out.  Although she is complaining of neck pain but she is too lethargic to receive any opioid medications.  Will treat with Tylenol only for now and we will consider opioids once she is more awake.   Prediabetes Recent A1c 6.4 on 05/02/2023.  Blood sugar controlled.   Hypertension: Blood pressure remained stable until this morning which was slightly elevated and thus I will resume amlodipine and Toprol-XL but hold Benicar and Minipress for  now.  Anxiety/depression: Resume Seroquel, Effexor and trazodone.  GERD: Resume PPI.  DVT prophylaxis: enoxaparin (LOVENOX) injection 40 mg Start: 05/24/23 1600   Code Status: Full Code  Family Communication:   None present at bedside.  Plan of care discussed with patient in length and he/she verbalized understanding and agreed with it.  Status is: Observation The patient will require care spanning > 2 midnights and should be moved to inpatient because: Needs EEG, PT OT and SLP assessment.  Still very lethargic.   Estimated body mass index is 31.24 kg/m as calculated from the following:   Height as of this encounter: 5\' 1"  (1.549 m).   Weight as of this encounter: 75 kg.    Nutritional Assessment: Body mass index is 31.24 kg/m.Marland Kitchen Seen by dietician.  I agree with the assessment and plan as outlined below: Nutrition Status:        . Skin Assessment: I have examined the patient's skin and I agree with the wound assessment as performed by the wound care RN as outlined below:    Consultants:  Neurology curbsided  Procedures:  None  Antimicrobials:  Anti-infectives (From admission, onward)    None         Subjective:   Objective: Vitals:   05/25/23 0800 05/25/23 0825 05/25/23 1120 05/25/23 1200  BP: 119/76  (!) 119/91 132/80  Pulse: 62  74   Resp: 18 18 17 18   Temp: 98.2 F (36.8 C)  98.6 F (37 C)   TempSrc: Oral  Oral   SpO2: 100%  98% 95%  Weight:      Height:        Intake/Output Summary (Last 24 hours) at 05/25/2023 1515 Last data filed at 05/25/2023 1149 Gross per 24 hour  Intake 240 ml  Output 825 ml  Net -585 ml   Filed Weights   05/23/23 1354  Weight: 75 kg    Examination:  She is awake, answering questions in a soft voice, which is difficult to understand, but can be more clear upon emphasizing on repeating it clearly Symmetrical Chest wall movement, Good air movement bilaterally, CTAB RRR,No Gallops,Rubs or new Murmurs, No Parasternal Heave +ve B.Sounds, Abd Soft, No tenderness, No rebound - guarding or rigidity. Neurologic exam is inconsistent.   Data Reviewed: I have personally reviewed following labs and imaging studies  CBC: Recent Labs   Lab 05/23/23 1406  WBC 5.7  NEUTROABS 2.0  HGB 12.6  HCT 39.8  MCV 78.8*  PLT 223   Basic Metabolic Panel: Recent Labs  Lab 05/23/23 1406  NA 142  K 3.6  CL 109  CO2 24  GLUCOSE 96  BUN 13  CREATININE 0.80  CALCIUM 9.7   GFR: Estimated Creatinine Clearance: 77.1 mL/min (by C-G formula based on SCr of 0.8 mg/dL). Liver Function Tests: Recent Labs  Lab 05/23/23 1406  AST 19  ALT 16  ALKPHOS 102  BILITOT 0.3  PROT 7.0  ALBUMIN 4.1   No results for input(s): "LIPASE", "AMYLASE" in the last 168 hours. No results for input(s): "AMMONIA" in the last 168 hours. Coagulation Profile: Recent Labs  Lab 05/23/23 1521  INR 1.1   Cardiac Enzymes: No results for input(s): "CKTOTAL", "CKMB", "CKMBINDEX", "TROPONINI" in the last 168 hours. BNP (last 3 results) No results for input(s): "PROBNP" in the last 8760 hours. HbA1C: No results for input(s): "HGBA1C" in the last 72 hours. CBG: Recent Labs  Lab 05/23/23 1347  GLUCAP 92   Lipid Profile: No results for  input(s): "CHOL", "HDL", "LDLCALC", "TRIG", "CHOLHDL", "LDLDIRECT" in the last 72 hours. Thyroid Function Tests: No results for input(s): "TSH", "T4TOTAL", "FREET4", "T3FREE", "THYROIDAB" in the last 72 hours. Anemia Panel: No results for input(s): "VITAMINB12", "FOLATE", "FERRITIN", "TIBC", "IRON", "RETICCTPCT" in the last 72 hours. Sepsis Labs: Recent Labs  Lab 05/23/23 1500 05/23/23 1607  LATICACIDVEN 0.6 0.7    No results found for this or any previous visit (from the past 240 hours).   Radiology Studies: EEG adult Result Date: 05/24/2023 Charlsie Quest, MD     05/24/2023 12:15 PM Patient Name: SRIJA SOUTHARD MRN: [id number] Epilepsy Attending: Charlsie Quest Referring Physician/Provider: Milon Dikes, MD Date: 05/24/2023 Duration: 22.52 mins Patient history: 53 yo F patient who has a history of functional neurological disorder with gait impairment, history of seizures coming in with concern for  seizure. EEG to evaluate for seizure Level of alertness: Awake, asleep AEDs during EEG study: LTG, VPA Technical aspects: This EEG study was done with scalp electrodes positioned according to the 10-20 International system of electrode placement. Electrical activity was reviewed with band pass filter of 1-70Hz , sensitivity of 7 uV/mm, display speed of 23mm/sec with a 60Hz  notched filter applied as appropriate. EEG data were recorded continuously and digitally stored.  Video monitoring was available and reviewed as appropriate. Description: The posterior dominant rhythm consists of 8Hz  activity of moderate voltage (25-35 uV) seen predominantly in posterior head regions, symmetric and reactive to eye opening and eye closing. Sleep was characterized by vertex waves, sleep spindles (12 to 14 Hz), maximal frontocentral region. Hyperventilation and photic stimulation were not performed.   IMPRESSION: This study is within normal limits. No seizures or epileptiform discharges were seen throughout the recording. A normal interictal EEG does not exclude the diagnosis of epilepsy. Charlsie Quest   MR BRAIN WO CONTRAST Result Date: 05/23/2023 CLINICAL DATA:  Seizure EXAM: MRI HEAD WITHOUT CONTRAST TECHNIQUE: Multiplanar, multiecho pulse sequences of the brain and surrounding structures were obtained without intravenous contrast. COMPARISON:  11/05/2015 brain MRI 05/23/2023 CTA head neck FINDINGS: Brain: No acute infarct, mass effect or extra-axial collection. No acute or chronic hemorrhage. There is multifocal hyperintense T2-weighted signal within the white matter. Parenchymal volume and CSF spaces are normal. The midline structures are normal. The hippocampi are normal and symmetric in size and signal. The hypothalamus and mamillary bodies are normal. There is no cortical ectopia or dysplasia. Vascular: Normal flow voids. Skull and upper cervical spine: Normal calvarium and skull base. Visualized upper cervical spine and  soft tissues are normal. Sinuses/Orbits:No paranasal sinus fluid levels or advanced mucosal thickening. No mastoid or middle ear effusion. Normal orbits. IMPRESSION: 1. No acute intracranial abnormality. 2. No seizure focus identified. 3. Findings of chronic small vessel ischemia. Electronically Signed   By: Deatra Robinson M.D.   On: 05/23/2023 21:06   DG Shoulder Right Result Date: 05/23/2023 CLINICAL DATA:  R shoulder pain EXAM: RIGHT SHOULDER - 2+ VIEW COMPARISON:  None Available. FINDINGS: There is no evidence of fracture or dislocation. There is no evidence of severe arthropathy or other focal bone abnormality. Soft tissues are unremarkable. Cervical spine surgical hardware noted. IMPRESSION: No acute displaced fracture or dislocation. Electronically Signed   By: Tish Frederickson M.D.   On: 05/23/2023 20:20   CT ANGIO HEAD NECK W WO CM Result Date: 05/23/2023 CLINICAL DATA:  Possible CVA. Seizures. Postictal. Abnormal speech. Bilateral lower extremity weakness. EXAM: CT ANGIOGRAPHY HEAD AND NECK WITH AND WITHOUT CONTRAST TECHNIQUE: Multidetector CT imaging  of the head and neck was performed using the standard protocol during bolus administration of intravenous contrast. Multiplanar CT image reconstructions and MIPs were obtained to evaluate the vascular anatomy. Carotid stenosis measurements (when applicable) are obtained utilizing NASCET criteria, using the distal internal carotid diameter as the denominator. RADIATION DOSE REDUCTION: This exam was performed according to the departmental dose-optimization program which includes automated exposure control, adjustment of the mA and/or kV according to patient size and/or use of iterative reconstruction technique. CONTRAST:  75mL OMNIPAQUE IOHEXOL 350 MG/ML SOLN COMPARISON:  CT head without contrast 09/17/2018 FINDINGS: CT HEAD FINDINGS Brain: No acute infarct, hemorrhage, or mass lesion is present. No significant white matter lesions are present. Deep brain  nuclei are within normal limits. The ventricles are of normal size. No significant extraaxial fluid collection is present. The brainstem and cerebellum are within normal limits. A relatively empty sella is present. Midline structures are otherwise within normal limits. Vascular: No hyperdense vessel or unexpected calcification. Skull: Calvarium is intact. No focal lytic or blastic lesions are present. No significant extracranial soft tissue lesion is present. Sinuses/Orbits: The paranasal sinuses and mastoid air cells are clear. The globes and orbits are within normal limits. Review of the MIP images confirms the above findings CTA NECK FINDINGS Aortic arch: A common origin of the left common carotid artery in the innominate artery is present. No significant atherosclerotic changes are present the aortic arch great vessel origins. No focal stenosis is present. Right carotid system: The right common carotid artery is within normal limits. Mild noncalcified plaque is present along the posterior aspect of the proximal right ICA without significant stenosis relative to the more distal vessel. The cervical right ICA is otherwise normal. Left carotid system: The left common carotid artery is within normal limits. Bifurcation is unremarkable. The cervical left ICA is normal. Vertebral arteries: The right vertebral artery is dominant. Both vertebral arteries originate from the subclavian arteries without significant stenosis. No significant stenosis is present in either vertebral artery in the neck. Skeleton: Cervical spine fusion is noted C4-5 C5-6 and C6-7. Mild right foraminal narrowing is present at C2-3 secondary to uncovertebral and facet spurring. Other neck: The soft tissues of the neck are otherwise unremarkable. Salivary glands are within normal limits. Thyroid is normal. No significant adenopathy is present. No focal mucosal or submucosal lesions are present. Upper chest: The lung apices are clear. The thoracic  inlet is within normal limits. Review of the MIP images confirms the above findings CTA HEAD FINDINGS Anterior circulation: The internal carotid arteries are within normal limits from the skull base to the ICA termini. The left A1 segment is dominant. An azygous A2 segment is present. The MCA bifurcations are normal bilaterally. The ACA and MCA branch vessels are normal bilaterally. No aneurysm is present. Posterior circulation: PICA origins are visualized and normal. The vertebrobasilar junction basilar artery normal. The superior cerebellar arteries are patent. Both posterior cerebral arteries originate from the basilar tip. The PCA branch vessels are normal bilaterally. Venous sinuses: The dural sinuses are patent. The straight sinus and deep cerebral veins are intact. Cortical veins are within normal limits. No significant vascular malformation is evident. Anatomic variants: None Review of the MIP images confirms the above findings IMPRESSION: 1. Normal noncontrast CT of the head. 2. Mild noncalcified plaque along the posterior aspect of the proximal right ICA without significant stenosis relative to the more distal vessel. 3. Otherwise normal CTA of the neck. 4. Normal CTA Circle of Willis without significant  proximal stenosis, aneurysm, or branch vessel occlusion. 5. Cervical spine fusion C4-5 C5-6 and C6-7. 6. Mild right foraminal narrowing at C2-3 secondary to uncovertebral and facet spurring. Electronically Signed   By: Marin Roberts M.D.   On: 05/23/2023 18:25    Scheduled Meds:  amLODipine  5 mg Oral Q1200   enoxaparin (LOVENOX) injection  40 mg Subcutaneous Q24H   lamoTRIgine  100 mg Oral Daily   linaclotide  72 mcg Oral QAC breakfast   metoprolol succinate  25 mg Oral Q2200   pantoprazole  40 mg Oral q morning   QUEtiapine  300 mg Oral QHS   traZODone  150 mg Oral QHS   venlafaxine XR  150 mg Oral Q breakfast   Continuous Infusions:     LOS: 1 day   Huey Bienenstock, MD [facility]  05/25/2023, 3:15 PM   *Please note that this is a verbal dictation therefore any spelling or grammatical errors are due to the "[employer] One" system interpretation.  Please page via Amion and do not message via secure chat for urgent patient care matters. Secure chat can be used for non urgent patient care matters.  How to contact the [facility] Attending or Consulting provider 7A - 7P or covering provider during after hours 7P -7A, for this patient?  Check the care team in [facility] and look for a) attending/consulting [facility] provider listed and b) the [facility] team listed. Page or secure chat 7A-7P. Log into [email or web address] and use [place]'s universal password to access. If you do not have the password, please contact the hospital operator. Locate the [facility] provider you are looking for under [facility] and page to a number that you can be directly reached. If you still have difficulty reaching the provider, please page the [facility] (Director on Call) for the Hospitalists listed on amion for assistance.

## 2023-05-25 NOTE — Consult Note (Signed)
 NEUROLOGY CONSULT NOTE   Date of service: May 25, 2023 Patient Name: Heather Figueroa MRN:  [id number] DOB:  [birth year] Chief Complaint: "Seizure-like events" Requesting Provider: Starleen Arms, MD  History of Present Illness  Heather Figueroa is a 53 y.o. female with PMHx significant for HTN, reported history of seizures, reported history of TIAs, anxiety, depression, tobacco use, and gait impairment who presents to the ED with reported seizures and chest pain. Patient reportedly had a seizure while at work on 05/23/2023. On EMS arrival, patient had slowed speech (no difficulty with word finding, chest very slowed response) and initially there was concern for postictal state. She also had difficulty moving her lower extremities. CT head, CTA head and neck, and MRI brain w/o did not show any acute findings.  EEG was normal without any epileptiform discharges.  Patient does endorse a history of "silent" seizures that started around 2017 and sometimes occur in her sleep with unclear frequency and description.  She states she has been seen by neurologist outpatient in [facility] and was started on Lamictal. Patient denies any family history of seizures, childhood seizures, head trauma or any history of brain tumors.     ROS  Comprehensive ROS performed and pertinent positives documented in HPI  Patient endorses right upper extremity discomfort.  Past History   Past Medical History:  Diagnosis Date   Anemia    Anxiety    Arthritis    Depression    Headache    Hypertension    Neuromuscular disorder (HCC)    Seizures (HCC)    pt has had 2 seizures in July 2018   Stroke [facility])    possible TIA in July 2018    Past Surgical History:  Procedure Laterality Date   ABDOMINAL HYSTERECTOMY     2002   ANTERIOR CERVICAL DECOMP/DISCECTOMY FUSION N/A 01/26/2017   Procedure: ANTERIOR CERVICAL DECOMPRESSION/DISCECTOMY FUSION, INTERBODY PROSTHESIS, PLATE/SCREWS, POSSIBLE CORPECTOMY CERVICAL FOUR-  CERVICAL FIVE, CERVICAL FIVE- CERVICAL SIX, CERVICAL SIX- CERVICAL SEVEN;  Surgeon: Tressie Stalker, MD;  Location: [facility] OR;  Service: Neurosurgery;  Laterality: N/A;  ANTERIOR CERVICAL DECOMPRESSION/DISCECTOMY FUSION, INTERBODY PROSTHESIS, PLATE/SCREWS, POSSIBLE CORPE   APPENDECTOMY     2010   COLONOSCOPY N/A 04/05/2022   Procedure: COLONOSCOPY;  Surgeon: Jaynie Collins, DO;  Location: [facility] ENDOSCOPY;  Service: Gastroenterology;  Laterality: N/A;   ESOPHAGOGASTRODUODENOSCOPY N/A 04/05/2022   Procedure: ESOPHAGOGASTRODUODENOSCOPY (EGD);  Surgeon: Jaynie Collins, DO;  Location: [facility] ENDOSCOPY;  Service: Gastroenterology;  Laterality: N/A;   OVARIAN CYST REMOVAL     TUBAL LIGATION     1998    Family History: Family History  Problem Relation Age of Onset   Colon cancer Mother    Hypertension Father    Prostate cancer Father    Pulmonary embolism Sister    Bladder Cancer Maternal Grandmother     Social History  reports that she has been smoking cigarettes. She has never used smokeless tobacco. She reports current alcohol use. She reports that she does not use drugs.  Allergies  Allergen Reactions   Pentazocine Anaphylaxis    Talwin   Percocet [Oxycodone-Acetaminophen] Other (See Comments)    Swelling generalized   Tylenol [Acetaminophen] Swelling    Medications   Current Facility-Administered Medications:    acetaminophen (TYLENOL) tablet 650 mg, 650 mg, Oral, Q6H PRN, Pahwani, Ravi, MD   amLODipine (NORVASC) tablet 5 mg, 5 mg, Oral, Q1200, Pahwani, Ravi, MD, 5 mg at 05/25/23 1200   enoxaparin (LOVENOX) injection 40 mg,  40 mg, Subcutaneous, Q24H, John Giovanni, MD, 40 mg at 05/24/23 1612   lamoTRIgine (LAMICTAL) tablet 100 mg, 100 mg, Oral, Daily, Pahwani, Ravi, MD, 100 mg at 05/25/23 8295   linaclotide (LINZESS) capsule 72 mcg, 72 mcg, Oral, QAC breakfast, Pahwani, Ravi, MD, 72 mcg at 05/25/23 6213   metoprolol succinate (TOPROL-XL) 24 hr tablet 25 mg, 25 mg,  Oral, Q2200, Pahwani, Ravi, MD, 25 mg at 05/24/23 2244   naloxone (NARCAN) injection 0.4 mg, 0.4 mg, Intravenous, PRN, John Giovanni, MD   ondansetron (ZOFRAN) injection 4 mg, 4 mg, Intravenous, Q6H PRN, Jacqulyn Bath, Ravi, MD, 4 mg at 05/24/23 1621   oxyCODONE (Oxy IR/ROXICODONE) immediate release tablet 5 mg, 5 mg, Oral, Q6H PRN, Pahwani, Ravi, MD, 5 mg at 05/24/23 1120   pantoprazole (PROTONIX) EC tablet 40 mg, 40 mg, Oral, q morning, Pahwani, Ravi, MD, 40 mg at 05/25/23 0824   QUEtiapine (SEROQUEL) tablet 300 mg, 300 mg, Oral, QHS, Pahwani, Ravi, MD, 300 mg at 05/24/23 2244   traZODone (DESYREL) tablet 150 mg, 150 mg, Oral, QHS, Pahwani, Ravi, MD, 150 mg at 05/24/23 2244   venlafaxine XR (EFFEXOR-XR) 24 hr capsule 150 mg, 150 mg, Oral, Q breakfast, Pahwani, Ravi, MD, 150 mg at 05/25/23 0823  Vitals   Vitals:   05/25/23 0800 05/25/23 0825 05/25/23 1120 05/25/23 1200  BP: 119/76  (!) 119/91 132/80  Pulse: 62  74   Resp: 18 18 17 18   Temp: 98.2 F (36.8 C)  98.6 F (37 C)   TempSrc: Oral  Oral   SpO2: 100%  98% 95%  Weight:      Height:        Body mass index is 31.24 kg/m.  Physical Exam   Constitutional: Appears well-developed and well-nourished.  Psych: Flat affect.  Eyes: No scleral injection.  HENT: No OP obstruction.  Head: Normocephalic.  Cardiovascular: Normal rate and regular rhythm.  Respiratory: Effort normal, non-labored breathing.  GI: Soft.  No distension. There is no tenderness.  Skin: WDI.   Neurologic Examination   **Exam was effort dependent and fluctuated based on level of encouragement.  Mental Status -  Level of arousal and orientation to time, place, and person were intact. Language including expression, naming, repetition, comprehension was assessed and found intact, though profoundly slowed, which appears to take more effort than regular speech. Attention span and concentration were normal. Recent and remote memory were intact. Fund of  Knowledge was assessed and was intact.  Cranial Nerves II - XII - II - Visual field intact OU. III, IV, VI - Extraocular movements intact. V - Facial sensation intact bilaterally. VII - Facial movement intact bilaterally. VIII - Hearing & vestibular intact bilaterally. X - Palate elevates symmetrically. XI - Chin turning & shoulder shrug intact bilaterally. XII - Tongue protrusion intact.  Motor Strength - The patient's strength was 4/5 throughout with some degree of fluctuation and effort dependency. pronator drift was absent.  Bulk was normal and fasciculations were absent.   Motor Tone - Muscle tone was assessed at the neck and appendages and was normal.  Reflexes - The patient's reflexes were symmetrical in all extremities and she had no pathological reflexes.  Sensory - Light touch, temperature/pinprick were assessed and were symmetrical.    Coordination - unable to assess due to level of participation.  Gait and Station - deferred.   Labs/Imaging/Neurodiagnostic studies   CBC:  Recent Labs  Lab 17-Jun-2023 1406  WBC 5.7  NEUTROABS 2.0  HGB 12.6  HCT  39.8  MCV 78.8*  PLT 223   Basic Metabolic Panel:  Lab Results  Component Value Date   NA 142 05/23/2023   K 3.6 05/23/2023   CO2 24 05/23/2023   GLUCOSE 96 05/23/2023   BUN 13 05/23/2023   CREATININE 0.80 05/23/2023   CALCIUM 9.7 05/23/2023   GFRNONAA >60 05/23/2023   GFRAA >60 02/07/2019   Lipid Panel:  Lab Results  Component Value Date   LDLCALC 98 06/19/2021   HgbA1c:  Lab Results  Component Value Date   HGBA1C 6.0 (H) 06/19/2021   Urine Drug Screen:     Component Value Date/Time   LABOPIA NONE DETECTED 02/07/2019 1843   COCAINSCRNUR NONE DETECTED 02/07/2019 1843   LABBENZ NONE DETECTED 02/07/2019 1843   AMPHETMU NONE DETECTED 02/07/2019 1843   THCU NONE DETECTED 02/07/2019 1843   LABBARB NONE DETECTED 02/07/2019 1843    Alcohol Level     Component Value Date/Time   ETH <10 05/23/2023 1500    INR  Lab Results  Component Value Date   INR 1.1 05/23/2023   APTT  Lab Results  Component Value Date   APTT 24 05/23/2023    CT Head without contrast(Personally reviewed): Normal noncontrast CT of the head.  CT angio Head and Neck with contrast(Personally reviewed): Mild noncalcified plaque along the posterior aspect of the proximal right ICA without significant stenosis relative to the more distal vessel. Cervical spine fusion C4-5 C5-6 and C6-7. Mild right foraminal narrowing at C2-3 secondary to uncovertebral and facet spurring.  MRI Brain(Personally reviewed): No acute intracranial abnormality. No seizure focus. Chronic microvascular ischemia.   Neurodiagnostics rEEG:  IMPRESSION: This study is within normal limits. No seizures or epileptiform discharges were seen throughout the recording.   A normal interictal EEG does not exclude the diagnosis of epilepsy.  ASSESSMENT   ANAROSA KUBISIAK is a 53 y.o. female with PMHx significant for HTN, reported history of seizures, reported history of TIAs, anxiety, depression, tobacco use, and gait impairment who presents to the ED with reported seizures, slowed speech, generalized weakness and chest pain.  Initially there was concern for possible postictal state and was started on Keppra 500 twice daily in addition to her home Lamictal.  Subsequent workup which included MRI brain, vessel imaging and EEG were unremarkable which along with her fluctuating, effort dependent exam raises suspicion as to whether reported seizure like events were indeed seizures.    RECOMMENDATIONS  At this time, I recommend discontinuing Keppra and making sure patient has follow-up outpatient with her neurologist for possible long-term monitoring on EEG to further clarify seizure-like events.  Also recommend seeing a psychologist for potential psychologic causes of these events.  No further inpatient neurological workup indicated at this time.   ______________________________________________________________________    Forest Gleason, MD [facility]

## 2023-05-25 NOTE — Discharge Instructions (Signed)
 Follow with Primary MD Kandyce Rud, MD in 7 days   Get CBC, CMP, checked  by Primary MD next visit.    Activity: As tolerated with Full fall precautions use walker/cane & assistance as needed   Disposition Home   Diet: Heart Healthy   On your next visit with your primary care physician please Get Medicines reviewed and adjusted.   Please request your Prim.MD to go over all Hospital Tests and Procedure/Radiological results at the follow up, please get all Hospital records sent to your Prim MD by signing hospital release before you go home.   If you experience worsening of your admission symptoms, develop shortness of breath, life threatening emergency, suicidal or homicidal thoughts you must seek medical attention immediately by calling 911 or calling your MD immediately  if symptoms less severe.  You Must read complete instructions/literature along with all the possible adverse reactions/side effects for all the Medicines you take and that have been prescribed to you. Take any new Medicines after you have completely understood and accpet all the possible adverse reactions/side effects.   Do not drive, operating heavy machinery, perform activities at heights, swimming or participation in water activities or provide baby sitting services to questionable diagnosis of siezures until you have seen by Primary MD or a Neurologist and advised to do so again.  Do not drive when taking Pain medications.    Do not take more than prescribed Pain, Sleep and Anxiety Medications  Special Instructions: If you have smoked or chewed Tobacco  in the last 2 yrs please stop smoking, stop any regular Alcohol  and or any Recreational drug use.  Wear Seat belts while driving.   Please note  You were cared for by a hospitalist during your hospital stay. If you have any questions about your discharge medications or the care you received while you were in the hospital after you are discharged, you can  call the unit and asked to speak with the hospitalist on call if the hospitalist that took care of you is not available. Once you are discharged, your primary care physician will handle any further medical issues. Please note that NO REFILLS for any discharge medications will be authorized once you are discharged, as it is imperative that you return to your primary care physician (or establish a relationship with a primary care physician if you do not have one) for your aftercare needs so that they can reassess your need for medications and monitor your lab values. -------------------------------------------------------------------------------------------------------------------------------------------------------------------------------  On behalf of the Psychiatry Consult team, it was a pleasure caring for you. Below are outlined additional information and resources for when you are discharged from [facility]. As discussed, we have included several psychiatry outpatient clinics for you to review for medication management:   Outpatient Therapy and Psychiatry for Medicare Recipients   [facility]  [address]  [place], [place], [address]  [phone] phone   [facility]  [address]  [place], [place], [address]  [address] phone  ([address] [employer] [address] - [place], [address], [place], [place], [employer], [address], [employer] Healthy [place], [place], [address], [place], [place], [place], [employer], [employer], [employer], [employer], [place])   [facility]  [address], [address]  [place], [place], [address]  [phone] phone   [facility]  [address], [address]  [place], [place], [address]  [phone] phone   [facility]  [address]  [place], [place], [address]  [phone] phone  [phone] fax   [facility] [place]  [address], [place], [address]  [phone] phone   [facility]  [address]  [place], [place], [address]  [phone] phone  [phone] fax   [facility]  [address] [place], [place], [address]  [phone] phone

## 2023-05-25 NOTE — Plan of Care (Signed)

## 2023-05-26 DIAGNOSIS — R569 Unspecified convulsions: Secondary | ICD-10-CM | POA: Diagnosis not present

## 2023-05-26 NOTE — TOC Transition Note (Addendum)
 Transition of Care [facility]) - Discharge Note   Patient Details  Name: Heather Figueroa MRN: [id number] Date of Birth: 10/30/1970  Transition of Care [facility]) CM/SW Contact:  Alesia Richards, RN 05/26/2023, 4:26 PM   Clinical Narrative:    Discharge orders noted. Per patient, patient's son, [name] will pick up today after work. Per Dr. Randol Kern, order for outpatient PT due to issues with patient's payer for home health.  CM call placed to [facility], phone: [phone] regarding outpatient PT referral. CM spoke to [facility]. Per Mallory, fax number is [phone]. CM to patient's room regarding pending discharge and outpatient PT referral. Patient verbalized understanding and agreement.   Final next level of care: OP Rehab Barriers to Discharge: No Barriers Identified   Patient Goals and CMS Choice    To return home      Discharge Placement               Home with outpatient rehab       Discharge Plan and Services Additional resources added to the After Visit Summary for   In-house Referral: Clinical Social Work        Social Drivers of Health (SDOH) Interventions SDOH Screenings   Food Insecurity: No Food Insecurity (05/24/2023)  Housing: Low Risk  (05/24/2023)  Transportation Needs: No Transportation Needs (05/24/2023)  Utilities: At Risk (05/24/2023)  Financial Resource Strain: High Risk (11/09/2022)   Received from [facility]  Tobacco Use: High Risk (05/23/2023)     Readmission Risk Interventions     No data to display

## 2023-05-26 NOTE — Plan of Care (Signed)

## 2023-05-26 NOTE — TOC Progression Note (Addendum)
 Transition of Care [facility]) - Progression Note    Patient Details  Name: Heather Figueroa MRN: [id number] Date of Birth: 04/20/1970  Transition of Care [facility]) CM/SW Contact  Alesia Richards, RN 05/26/2023, 11:10 AM  Clinical Narrative:     CM discussed case with Dr. Randol Kern regarding discharge care planning. Hospital day 3 with diagnosis of Seizures. Noted, SNF recommendations. CM to patient's room regarding SNF recommendations and SNF preferences. Patient has declined SNF and is amenable to home health. Patient's uncle at patient's bedside. Patient's home health preferences include [facility] and [facility]. Per patient, patient's son, [name] will provide transportation home.  CM call to [facility], phone: [phone] regarding home health services. Per Haywood Lasso, agency does not accept payer. CM call to [facility], phone: [phone] regarding home health services, no answer. CM left message for return call. CM call to Elnita Maxwell [facility], phone: [phone] regarding home health services. Per Elnita Maxwell, does not accept patient's payer. CM alert to Dr. Randol Kern regarding  home health services unable to accept managed Medicaid plans.   CM follow up call placed to patient regarding possible outpatient PT/OT and discharge today versus tomorrow. Per patient, can discharge tomorrow morning and is amenable to outpatient PT/OT. CM alert to Dr. Randol Kern regarding outpatient therapy and discharge date.   Call received from patient, regarding possibly discharge today via patient's son at 81. CM alert to Dr. Randol Kern regarding patient's call regarding possibly discharge today.    Barriers to Discharge: SNF Pending bed offer, Insurance Authorization  Expected Discharge Plan and Services In-house Referral: Clinical Social Work       Expected Discharge Date: 05/25/23                  Social Determinants of Health (SDOH)  Interventions SDOH Screenings   Food Insecurity: No Food Insecurity (05/24/2023)  Housing: Low Risk  (05/24/2023)  Transportation Needs: No Transportation Needs (05/24/2023)  Utilities: At Risk (05/24/2023)  Financial Resource Strain: High Risk (11/09/2022)   [facility]  Tobacco Use: High Risk (05/23/2023)    Readmission Risk Interventions     No data to display

## 2023-05-26 NOTE — Consult Note (Signed)
 [facility] Psychiatric Consult Initial  Patient Name: .Heather Figueroa  MRN: [id number]  DOB: [birth year]  Consult Order details:  Orders (From admission, onward)     Start     Ordered   05/25/23 1758  IP CONSULT TO PSYCHIATRY       Ordering Provider: Starleen Arms, MD  Provider:  (Not yet assigned)  Question Answer Comment  Location [facility]   Reason for Consult? functional disorder, psychgenuc seizures      05/25/23 1758             Mode of Visit: In person    Psychiatry Consult Evaluation  Service Date: May 26, 2023 LOS:  LOS: 2 days  Chief Complaint   Primary Psychiatric Diagnoses  Bipolar 2 Disorder, most recent episode hypomanic 2.  PTSD  Assessment  Heather Figueroa is a 53 y.o. female admitted: Medicallyfor 05/23/2023  1:34 PM for workup of seizure and chest pain. She carries the prior psychiatric diagnoses of bipolar disorder, PTSD, and has a past medical history of  functional neurologic disorder with gait impairment, seizures, CVA/TIA, anxiety/depression, chronic constipation, GERD, history of PFO, prediabetes, tobacco abuse .   Patient with a charted diagnosis of bipolar I disorder, though clinical history provided is vague and more consistent with bipolar II, given description of mood cycling and a self-reported hypomanic episode approximately two months ago. No recent depressive episodes endorsed. Patient reports current psychiatric medications--quetiapine, venlafaxine, and trazodone--are effective, and she is satisfied with her regimen. These medications are consistent with treatment for bipolar depression.  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 The patient was not receptive to the interview and expressed irritation with our involvement. She did not present with any acute psychiatric symptoms or safety concerns requiring inpatient  intervention. Psychotropic medications are currently managed by her PCP. Outpatient psychiatry follow-up was recommended, and appropriate resources were provided in discharge instructions. No changes to psychiatric treatment plan at this time. We will sign off.   See additional recommendations below.  Diagnoses:  Active Hospital problems: Principal Problem:   Seizure [facility]) Active Problems:   Chest pain   Prediabetes   Essential hypertension   Anxiety and depression    Plan   ## Psychiatric Medication Recommendations:  Continue current psychotropic regiment, no additional changes  ## Medical Decision Making Capacity: Not specifically addressed in this encounter  ## Further Work-up:  -- most recent EKG on 05/24/2023 had QtC of 397 -- Pertinent labwork reviewed   ## Disposition:-- There are no psychiatric contraindications to discharge at this time  ## Behavioral / Environmental:   -Recommend using specific terminology regarding PNES, i.e. call the episodes "non-epileptic seizures" rather than "pseudoseizures" as the latter insinuates "fake" or "feigned" symptoms, when the events are a very real experience to the patient and are a physical, non-volitional, manifestation of fear, pain and anxiety.     ## Safety and Observation Level:  - Based on my clinical evaluation, I estimate the patient to be at minmal risk of self harm in the current setting. - At this time, we recommend  routine. This decision is based on my review of the chart including patient's history and current presentation, interview of the patient, mental status examination, and consideration of suicide risk including evaluating suicidal ideation, plan, intent, suicidal or self-harm behaviors, risk factors, and protective factors. This judgment is based on our ability to directly address suicide risk, implement suicide  prevention strategies, and develop a safety plan while the patient is in the clinical setting. Please  contact our team if there is a concern that risk level has changed.  CSSR Risk Category:C-SSRS RISK CATEGORY: No Risk  Suicide Risk Assessment: Patient has following modifiable risk factors for suicide: lack of access to outpatient mental health resources, which we are addressing by providing outpatient resources for psychiatric follow up. Patient has following non-modifiable or demographic risk factors for suicide: history of suicide attempt Patient has the following protective factors against suicide: Supportive family and Cultural, spiritual, or religious beliefs that discourage suicide  Thank you for this consult request. Recommendations have been communicated to the primary team.  We will sign off at this time.   Lorri Frederick, MD       History of Present Illness  Relevant Aspects of [facility] Course:  Admitted on 05/23/2023 for workup of seizures, patient arriving to ED via EMS for reported seizure at work.  Neurology consulted, EEG completed and reviewed with no seizure like activity or epileptiform discharges.  Patient Report:  Patient was evaluated today and reports that she became ill because she was "sick on work." She expressed mistrust of the medical system, stating, "Nobody can diagnose me but they depend on technology, which is stupid," and, "I don't trust none of you." She reports that she only trusts her previous neurologists. Despite this, she was cooperative during the interview and able to provide a detailed history. She has been engaged in therapy for the past 4.5 years and reports adherence to her psychiatric medications, which include Seroquel, Trazodone (which she states she has been taking since age 43), and Effexor. She believes her current medications have been effective. Her psychiatric medications are currently being managed by her PCP, as her former psychiatrist's office has closed.  She reports that her sleep has been disrupted since 2017, when she  states her ex-partner "found her dead in the bathroom." She describes having had a severe headache at that time, which she recalls as feeling like "someone sawing [her] brain." She now sleeps approximately 5-6 hours per night and notes that sleeping longer leaves her feeling more exhausted.She denies any recent stressors. Denies current suicidal ideation, thoughts of self-harm, or hallucinations. She reports a suicide attempt at age 45 but has had no recurrence since that time. She is currently working part-time at [employer] and identifies her children as her main source of support.  Patient reports new-onset left shoulder weakness that began during this hospitalization. She has a history of spine surgery and attended physical therapy for four years postoperatively. She was open to receiving outpatient resources to establish psychiatric care after discharge.  The patient reported a distant suicide attempt at age 87, otherwise has not had any recent depressive episodes.  She denies any suicidal ideations or recent self-harm.  She denies homicidal ideations.    Psych ROS:  Depression: . Denies any recent low mood or depressive episodes. reports intermittent feelings of hopelessness, they do not occur daily.  Anxiety: Not formally assessed Mania (lifetime and current): has had prior manic episodes, 2 months stops sleeping, increased goal directed activity, pressured speech. activity. Reports these episodes can last up to 3 days. PTSD: has prior trauma form childhood. .reports it has improved with therapy. No recent trauma associated nightmares. Denies hyperarousal symptoms, like startle response . Reports avoidance symptoms int he form of avoiding crowds, certain family members.  Psychosis: (lifetime and current): report sno current AVH. She is unsure  if she has experienced hallucinations in the past, believes she may have experienced   Review of Systems  All other systems reviewed and are negative.     Psychiatric and Social History  Psychiatric History:  Information collected from patient and chart review  Prev Dx/Sx: PTSD, bipolar I disorder Current Psych Provider: No psychiatrist, PCP manages psychotropic medication Home Meds (current): seroquel, effexor, trazodone Previous Med Trials: prazosin, vybrid Therapy: Has a psychologist she frequently follows up with, does not recall having CBT or DBT in the past Prior Psych Hospitalization: hospitalized at age 26 yo for suicide attempt  Prior Self Harm: prior suicide attempt at age 85 Prior Violence: No Family Psych History: Denies Family Hx suicide: Denies  Social History:  Developmental Hx: Patient reports she had a difficult uprbinging, after her grand-parents passed away when she was young she had to live in group homes and with different relatives.  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 Denies prior hx of abuse History of DT's --no Tobacco: smokes 1ppd for over 20 years Illicit drugs: Denies Prescription drug abuse: None identified on chart review Rehab hx: None identified on chart review  Exam Findings  Physical Exam:  Vital Signs:  Temp:  [97.7 F (36.5 C)-98.6 F (37 C)] 97.7 F (36.5 C) (04/03 0800) Pulse Rate:  [57-79] 57 (04/03 0800) Resp:  [10-18] 10 (04/03 0800) BP: (99-132)/(60-115) 125/78 (04/03 0800) SpO2:  [95 %-98 %] 97 % (04/03 0800) Blood pressure 125/78, pulse (!) 57, temperature 97.7 F (36.5 C), temperature source Oral, resp. rate 10, height 5\' 1"  (1.549 m), weight 75 kg, SpO2 97%. Body mass index is 31.24 kg/m.  Physical Exam Vitals and nursing note reviewed.  Constitutional:      General: She is not in acute distress.    Appearance: She is not ill-appearing.  HENT:     Head:  Normocephalic and atraumatic.  Eyes:     Extraocular Movements: Extraocular movements intact.  Pulmonary:     Effort: Pulmonary effort is normal. No respiratory distress.  Neurological:     Mental Status: She is oriented to person, place, and time.     Mental Status Exam: General Appearance: Fairly Groomed  Orientation:  Full (Time, Place, and Person)  Memory: Grossly intact  Concentration: Grossly intact  Recall:  Poor  Attention  Fair  Eye Contact:  Good  Speech:  Clear and Coherent and Normal Rate  Language:  Fair  Volume:  Normal  Mood: "I don't trust any of you all"  Affect:   Irritable  Thought Process:  Coherent and Linear  Thought Content:  Logical  Suicidal Thoughts:  No  Homicidal Thoughts:  No  Judgement:  Fair  Insight:  Insight appears limited, as evidenced by the patient's resistance to psychiatric involvement during hospitalization and vague reporting of psychiatric history.  Psychomotor Activity:  Normal  Akathisia:  No  Fund of Knowledge:  Fair      Assets:  Presenter, broadcasting Social Support Vocational/Educational  Cognition:  WNL  ADL's:  Intact  AIMS (if indicated):      Other History   These have been pulled in through the EMR, reviewed, and updated if appropriate.  Family History:  The patient's family history includes Bladder Cancer in her maternal grandmother; Colon cancer in her mother;  Hypertension in her father; Prostate cancer in her father; Pulmonary embolism in her sister.  Medical History: Past Medical History:  Diagnosis Date   Anemia    Anxiety    Arthritis    Depression    Headache    Hypertension    Neuromuscular disorder (HCC)    Seizures (HCC)    pt has had 2 seizures in July 2018   Stroke [facility])    possible TIA in July 2018    Surgical History: Past Surgical History:  Procedure Laterality Date   ABDOMINAL HYSTERECTOMY     2002   ANTERIOR CERVICAL DECOMP/DISCECTOMY FUSION N/A 01/26/2017   Procedure:  ANTERIOR CERVICAL DECOMPRESSION/DISCECTOMY FUSION, INTERBODY PROSTHESIS, PLATE/SCREWS, POSSIBLE CORPECTOMY CERVICAL FOUR- CERVICAL FIVE, CERVICAL FIVE- CERVICAL SIX, CERVICAL SIX- CERVICAL SEVEN;  Surgeon: Tressie Stalker, MD;  Location: [facility] OR;  Service: Neurosurgery;  Laterality: N/A;  ANTERIOR CERVICAL DECOMPRESSION/DISCECTOMY FUSION, INTERBODY PROSTHESIS, PLATE/SCREWS, POSSIBLE CORPE   APPENDECTOMY     2010   COLONOSCOPY N/A 04/05/2022   Procedure: COLONOSCOPY;  Surgeon: Jaynie Collins, DO;  Location: [facility] ENDOSCOPY;  Service: Gastroenterology;  Laterality: N/A;   ESOPHAGOGASTRODUODENOSCOPY N/A 04/05/2022   Procedure: ESOPHAGOGASTRODUODENOSCOPY (EGD);  Surgeon: Jaynie Collins, DO;  Location: [facility] ENDOSCOPY;  Service: Gastroenterology;  Laterality: N/A;   OVARIAN CYST REMOVAL     TUBAL LIGATION     1998     Medications:   Current Facility-Administered Medications:    amLODipine (NORVASC) tablet 5 mg, 5 mg, Oral, Q1200, Pahwani, Ravi, MD, 5 mg at 05/25/23 1200   enoxaparin (LOVENOX) injection 40 mg, 40 mg, Subcutaneous, Q24H, Rathore, Vasundhra, MD, 40 mg at 05/25/23 1715   feeding supplement (ENSURE ENLIVE / ENSURE PLUS) liquid 237 mL, 237 mL, Oral, BID BM, Elgergawy, Leana Roe, MD, 237 mL at 05/26/23 0900   lamoTRIgine (LAMICTAL) tablet 100 mg, 100 mg, Oral, Daily, Pahwani, Ravi, MD, 100 mg at 05/26/23 0900   linaclotide (LINZESS) capsule 72 mcg, 72 mcg, Oral, QAC breakfast, Pahwani, Ravi, MD, 72 mcg at 05/26/23 0900   metoprolol succinate (TOPROL-XL) 24 hr tablet 25 mg, 25 mg, Oral, Q2200, Pahwani, Ravi, MD, 25 mg at 05/25/23 2034   naloxone [facility]) injection 0.4 mg, 0.4 mg, Intravenous, PRN, John Giovanni, MD   ondansetron (ZOFRAN) injection 4 mg, 4 mg, Intravenous, Q6H PRN, Pahwani, Ravi, MD, 4 mg at 05/24/23 1621   pantoprazole (PROTONIX) EC tablet 40 mg, 40 mg, Oral, q morning, Pahwani, Ravi, MD, 40 mg at 05/26/23 0900   QUEtiapine (SEROQUEL) tablet 300 mg, 300 mg,  Oral, QHS, Pahwani, Ravi, MD, 300 mg at 05/25/23 2034   traZODone (DESYREL) tablet 150 mg, 150 mg, Oral, QHS, Pahwani, Ravi, MD, 150 mg at 05/25/23 2033   venlafaxine XR (EFFEXOR-XR) 24 hr capsule 150 mg, 150 mg, Oral, Q breakfast, Pahwani, Ravi, MD, 150 mg at 05/26/23 0900  Allergies: Allergies  Allergen Reactions   Pentazocine Anaphylaxis    [place]   Percocet [Oxycodone-Acetaminophen] Other (See Comments)    Swelling generalized   Tylenol [Acetaminophen] Swelling    Lorri Frederick, MD

## 2023-05-26 NOTE — Discharge Summary (Addendum)
 Physician Discharge Summary  Heather Figueroa:[id number] DOB: [birth year] DOA: 05/23/2023  PCP: Heather Rud, MD  Admit date: 05/23/2023 Discharge date: 05/26/2023  Admitted From: (Home) Disposition:  (Home)  Recommendations for Outpatient Follow-up:  Follow up with PCP in 1-2 weeks Please obtain BMP/CBC in one week   Home Health: Patient PT/OT   Brief/Interim Summary: Heather Figueroa is a 53 y.o. female with medical history significant of hypertension, history of functional neurologic disorder with gait impairment, seizures, CVA/TIA, anxiety/depression, chronic constipation, GERD, history of PFO, prediabetes, tobacco abuse presented to the ED via EMS from work for evaluation of seizure and chest pain.  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 ,  Given multiple different inconsistent neurological complaints, and consistent physical exam, so psychiatry were consulted, with patient has declined their involvement.     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   -.Deceived IV Keppra in ED, no recurrence of seizures during hospital stay s -MRI brain with no acute findings, EEG with no acute findings. -Concern for functional neurological disorder . -Physical exam is inconsistent, [facility] consulted, they as well felt this is nonepileptic seizures, recommended psychiatry consult, patient has declined their input. -Continue with Lamictal  -Patient reports she is following with a psychologist as an outpatient Heather Figueroa as an outpatient for last 4  years.  Concern for nonepileptiform seizure, but given seizures were on the differential diagnosis, she was given the recommendations for seizure precaution, she was explained she cannot drive for 6 months unless she is cleared by her neurologist.   Chest pain/neck pain Chest x-ray showing no acute cardiopulmonary process.  Workup not suggestive of ACS.  D-dimer negative.  Thromboembolism ruled out.  Although she is complaining of neck pain but she is too lethargic to receive any opioid medications.   -Report right shoulder pain, x-ray with no acute findings   Prediabetes Recent A1c 6.4 on 05/02/2023.  Blood sugar controlled.   Hypertension: Blood pressure remained stable. On amlodipine and Toprol-XL but hold Benicar and Minipress for now.   Anxiety/depression: Resume Seroquel, Effexor and trazodone.   GERD: Resume PPI.    Discharge Diagnoses:  Principal Problem:   Seizure [facility]) Active Problems:   Chest pain   Prediabetes   Essential hypertension   Anxiety and depression    Discharge Instructions  Discharge Instructions     Ambulatory referral to Physical Therapy   Complete by: As directed    Diet - low sodium heart healthy   Complete by: As directed    Discharge instructions   Complete by: As directed    Follow with Primary MD Heather Rud, MD in 7 days   Get CBC, CMP, checked  by Primary MD next visit.    Activity: As tolerated with Full fall precautions use walker/cane & assistance as needed   Disposition Home   Diet: Heart Healthy   On your next visit with your primary care physician please Get Medicines reviewed and adjusted.   Please request your Prim.MD to go over all Hospital Tests and Procedure/Radiological results at the follow up,  please get all Hospital records sent to your Prim MD by signing hospital release before you go home.   If you experience worsening of your admission symptoms, develop shortness of breath, life threatening emergency,  suicidal or homicidal thoughts you must seek medical attention immediately by calling 911 or calling your MD immediately  if symptoms less severe.  You Must read complete instructions/literature along with all the possible adverse reactions/side effects for all the Medicines you take and that have been prescribed to you. Take any new Medicines after you have completely understood and accpet all the possible adverse reactions/side effects.   Do not drive, operating heavy machinery, perform activities at heights, swimming or participation in water activities or provide baby sitting services to questionable diagnosis of siezures until you have seen by Primary MD or a Neurologist and advised to do so again.  Do not drive when taking Pain medications.    Do not take more than prescribed Pain, Sleep and Anxiety Medications  Special Instructions: If you have smoked or chewed Tobacco  in the last 2 yrs please stop smoking, stop any regular Alcohol  and or any Recreational drug use.  Wear Seat belts while driving.   Please note  You were cared for by a hospitalist during your hospital stay. If you have any questions about your discharge medications or the care you received while you were in the hospital after you are discharged, you can call the unit and asked to speak with the hospitalist on call if the hospitalist that took care of you is not available. Once you are discharged, your primary care physician will handle any further medical issues. Please note that NO REFILLS for any discharge medications will be authorized once you are discharged, as it is imperative that you return to your primary care physician (or establish a relationship with a primary care physician if you do not have one) for your aftercare needs so that they can reassess your need for medications and monitor your lab values.   Increase activity slowly   Complete by: As directed       Allergies as of 05/26/2023       Reactions    Pentazocine Anaphylaxis   Talwin   Percocet [oxycodone-acetaminophen] Other (See Comments)   Swelling generalized   Tylenol [acetaminophen] Swelling        Medication List     STOP taking these medications    nicotine 7 mg/24hr patch Commonly known as: NICODERM CQ - dosed in mg/24 hr   nicotine polacrilex 4 MG lozenge Commonly known as: Nicotine Mini   olmesartan-hydrochlorothiazide 40-25 MG tablet Commonly known as: Benicar HCT   prazosin 2 MG capsule Commonly known as: MINIPRESS       TAKE these medications    amLODipine 5 MG tablet Commonly known as: NORVASC Take 1 tablet by mouth daily at 12 noon.   cholecalciferol 25 MCG (1000 UNIT) tablet Commonly known as: VITAMIN D3 Take 1,000 Units by mouth daily.   lamoTRIgine 100 MG tablet Commonly known as: LAMICTAL Take 100 mg by mouth daily.   Linzess 72 MCG capsule Generic drug: linaclotide Take 72 mcg by mouth daily before breakfast.   metFORMIN 500 MG tablet Commonly known as: GLUCOPHAGE Take 2 tablets by mouth 2 (two) times daily with a meal.   metoprolol succinate 25 MG 24 hr tablet Commonly known as: Toprol XL Take 1 tablet (25 mg total) by mouth daily at 10 pm.   pantoprazole 40 MG tablet Commonly  known as: PROTONIX Take 40 mg by mouth every morning.   QUEtiapine 300 MG tablet Commonly known as: SEROQUEL Take 300 mg by mouth at bedtime.   rizatriptan 10 MG tablet Commonly known as: MAXALT Take 10 mg by mouth as needed for migraine.   sucralfate 1 g tablet Commonly known as: CARAFATE Take 1 g by mouth 4 (four) times daily.   traZODone 100 MG tablet Commonly known as: DESYREL Take 150 mg by mouth at bedtime.   venlafaxine XR 150 MG 24 hr capsule Commonly known as: EFFEXOR-XR Take 150 mg by mouth daily with breakfast.        Allergies  Allergen Reactions   Pentazocine Anaphylaxis    Talwin   Percocet [Oxycodone-Acetaminophen] Other (See Comments)    Swelling generalized    Tylenol [Acetaminophen] Swelling    Consultations: Neurology Psychiatry   Procedures/Studies: EEG adult Result Date: 05/24/2023 Charlsie Quest, MD     05/24/2023 12:15 PM Patient Name: Heather Figueroa MRN: [id number] Epilepsy Attending: Charlsie Quest Referring Physician/Provider: Milon Dikes, MD Date: 05/24/2023 Duration: 22.52 mins Patient history: 53 yo F patient who has a history of functional neurological disorder with gait impairment, history of seizures coming in with concern for seizure. EEG to evaluate for seizure Level of alertness: Awake, asleep AEDs during EEG study: LTG, VPA Technical aspects: This EEG study was done with scalp electrodes positioned according to the 10-20 International system of electrode placement. Electrical activity was reviewed with band pass filter of 1-70Hz , sensitivity of 7 uV/mm, display speed of 59mm/sec with a 60Hz  notched filter applied as appropriate. EEG data were recorded continuously and digitally stored.  Video monitoring was available and reviewed as appropriate. Description: The posterior dominant rhythm consists of 8Hz  activity of moderate voltage (25-35 uV) seen predominantly in posterior head regions, symmetric and reactive to eye opening and eye closing. Sleep was characterized by vertex waves, sleep spindles (12 to 14 Hz), maximal frontocentral region. Hyperventilation and photic stimulation were not performed.   IMPRESSION: This study is within normal limits. No seizures or epileptiform discharges were seen throughout the recording. A normal interictal EEG does not exclude the diagnosis of epilepsy. Charlsie Quest   MR BRAIN WO CONTRAST Result Date: 05/23/2023 CLINICAL DATA:  Seizure EXAM: MRI HEAD WITHOUT CONTRAST TECHNIQUE: Multiplanar, multiecho pulse sequences of the brain and surrounding structures were obtained without intravenous contrast. COMPARISON:  11/05/2015 brain MRI 05/23/2023 CTA head neck FINDINGS: Brain: No acute infarct, mass  effect or extra-axial collection. No acute or chronic hemorrhage. There is multifocal hyperintense T2-weighted signal within the white matter. Parenchymal volume and CSF spaces are normal. The midline structures are normal. The hippocampi are normal and symmetric in size and signal. The hypothalamus and mamillary bodies are normal. There is no cortical ectopia or dysplasia. Vascular: Normal flow voids. Skull and upper cervical spine: Normal calvarium and skull base. Visualized upper cervical spine and soft tissues are normal. Sinuses/Orbits:No paranasal sinus fluid levels or advanced mucosal thickening. No mastoid or middle ear effusion. Normal orbits. IMPRESSION: 1. No acute intracranial abnormality. 2. No seizure focus identified. 3. Findings of chronic small vessel ischemia. Electronically Signed   By: Deatra Robinson M.D.   On: 05/23/2023 21:06   DG Shoulder Right Result Date: 05/23/2023 CLINICAL DATA:  R shoulder pain EXAM: RIGHT SHOULDER - 2+ VIEW COMPARISON:  None Available. FINDINGS: There is no evidence of fracture or dislocation. There is no evidence of severe arthropathy or other focal bone abnormality. Soft tissues  are unremarkable. Cervical spine surgical hardware noted. IMPRESSION: No acute displaced fracture or dislocation. Electronically Signed   By: Tish Frederickson M.D.   On: 05/23/2023 20:20   CT ANGIO HEAD NECK W WO CM Result Date: 05/23/2023 CLINICAL DATA:  Possible CVA. Seizures. Postictal. Abnormal speech. Bilateral lower extremity weakness. EXAM: CT ANGIOGRAPHY HEAD AND NECK WITH AND WITHOUT CONTRAST TECHNIQUE: Multidetector CT imaging of the head and neck was performed using the standard protocol during bolus administration of intravenous contrast. Multiplanar CT image reconstructions and MIPs were obtained to evaluate the vascular anatomy. Carotid stenosis measurements (when applicable) are obtained utilizing NASCET criteria, using the distal internal carotid diameter as the denominator.  RADIATION DOSE REDUCTION: This exam was performed according to the departmental dose-optimization program which includes automated exposure control, adjustment of the mA and/or kV according to patient size and/or use of iterative reconstruction technique. CONTRAST:  75mL OMNIPAQUE IOHEXOL 350 MG/ML SOLN COMPARISON:  CT head without contrast 09/17/2018 FINDINGS: CT HEAD FINDINGS Brain: No acute infarct, hemorrhage, or mass lesion is present. No significant white matter lesions are present. Deep brain nuclei are within normal limits. The ventricles are of normal size. No significant extraaxial fluid collection is present. The brainstem and cerebellum are within normal limits. A relatively empty sella is present. Midline structures are otherwise within normal limits. Vascular: No hyperdense vessel or unexpected calcification. Skull: Calvarium is intact. No focal lytic or blastic lesions are present. No significant extracranial soft tissue lesion is present. Sinuses/Orbits: The paranasal sinuses and mastoid air cells are clear. The globes and orbits are within normal limits. Review of the MIP images confirms the above findings CTA NECK FINDINGS Aortic arch: A common origin of the left common carotid artery in the innominate artery is present. No significant atherosclerotic changes are present the aortic arch great vessel origins. No focal stenosis is present. Right carotid system: The right common carotid artery is within normal limits. Mild noncalcified plaque is present along the posterior aspect of the proximal right ICA without significant stenosis relative to the more distal vessel. The cervical right ICA is otherwise normal. Left carotid system: The left common carotid artery is within normal limits. Bifurcation is unremarkable. The cervical left ICA is normal. Vertebral arteries: The right vertebral artery is dominant. Both vertebral arteries originate from the subclavian arteries without significant stenosis. No  significant stenosis is present in either vertebral artery in the neck. Skeleton: Cervical spine fusion is noted C4-5 C5-6 and C6-7. Mild right foraminal narrowing is present at C2-3 secondary to uncovertebral and facet spurring. Other neck: The soft tissues of the neck are otherwise unremarkable. Salivary glands are within normal limits. Thyroid is normal. No significant adenopathy is present. No focal mucosal or submucosal lesions are present. Upper chest: The lung apices are clear. The thoracic inlet is within normal limits. Review of the MIP images confirms the above findings CTA HEAD FINDINGS Anterior circulation: The internal carotid arteries are within normal limits from the skull base to the ICA termini. The left A1 segment is dominant. An azygous A2 segment is present. The MCA bifurcations are normal bilaterally. The ACA and MCA branch vessels are normal bilaterally. No aneurysm is present. Posterior circulation: PICA origins are visualized and normal. The vertebrobasilar junction basilar artery normal. The superior cerebellar arteries are patent. Both posterior cerebral arteries originate from the basilar tip. The PCA branch vessels are normal bilaterally. Venous sinuses: The dural sinuses are patent. The straight sinus and deep cerebral veins are intact. Cortical veins  are within normal limits. No significant vascular malformation is evident. Anatomic variants: None Review of the MIP images confirms the above findings IMPRESSION: 1. Normal noncontrast CT of the head. 2. Mild noncalcified plaque along the posterior aspect of the proximal right ICA without significant stenosis relative to the more distal vessel. 3. Otherwise normal CTA of the neck. 4. Normal CTA Circle of Willis without significant proximal stenosis, aneurysm, or branch vessel occlusion. 5. Cervical spine fusion C4-5 C5-6 and C6-7. 6. Mild right foraminal narrowing at C2-3 secondary to uncovertebral and facet spurring. Electronically Signed    By: Marin Roberts M.D.   On: 05/23/2023 18:25   DG Chest Portable 1 View Result Date: 05/23/2023 CLINICAL DATA:  Chest pain and tightness. EXAM: PORTABLE CHEST 1 VIEW COMPARISON:  CT 06/18/2021.  Radiographs 06/18/2021 and 08/14/2018. FINDINGS: 1421 hours. The heart size and mediastinal contours are stable. Lower lung volumes with mild resulting bibasilar atelectasis. No edema, confluent airspace disease, pleural effusion or pneumothorax. No acute osseous findings are evident status post cervical fusion. Telemetry leads overlie the chest. IMPRESSION: Lower lung volumes with mild bibasilar atelectasis. No acute cardiopulmonary process. Electronically Signed   By: Carey Bullocks M.D.   On: 05/23/2023 15:41      Subjective: Complain of musculoskeletal right shoulder pain   Discharge Exam: Vitals:   05/26/23 0800 05/26/23 1200  BP: 125/78 139/75  Pulse: (!) 57 69  Resp: 10 16  Temp: 97.7 F (36.5 C) 97.8 F (36.6 C)  SpO2: 97% 97%   Vitals:   05/25/23 2316 05/26/23 0400 05/26/23 0800 05/26/23 1200  BP: 104/62 99/60 125/78 139/75  Pulse: 65 (!) 58 (!) 57 69  Resp: 13 12 10 16   Temp: 98.2 F (36.8 C) 98.4 F (36.9 C) 97.7 F (36.5 C) 97.8 F (36.6 C)  TempSrc: Oral Oral Oral Oral  SpO2: 96% 95% 97% 97%  Weight:      Height:        General: Pt is alert, awake, not in acute distress Cardiovascular: RRR, S1/S2 +, no rubs, no gallops Respiratory: CTA bilaterally, no wheezing, no rhonchi Abdominal: Soft, NT, ND, bowel sounds + Extremities: no edema, no cyanosis    The results of significant diagnostics from this hospitalization (including imaging, microbiology, ancillary and laboratory) are listed below for reference.     Microbiology: No results found for this or any previous visit (from the past 240 hours).   Labs: BNP (last 3 results) No results for input(s): "BNP" in the last 8760 hours. Basic Metabolic Panel: Recent Labs  Lab 05/23/23 1406  NA 142  K  3.6  CL 109  CO2 24  GLUCOSE 96  BUN 13  CREATININE 0.80  CALCIUM 9.7   Liver Function Tests: Recent Labs  Lab 05/23/23 1406  AST 19  ALT 16  ALKPHOS 102  BILITOT 0.3  PROT 7.0  ALBUMIN 4.1   No results for input(s): "LIPASE", "AMYLASE" in the last 168 hours. No results for input(s): "AMMONIA" in the last 168 hours. CBC: Recent Labs  Lab 05/23/23 1406  WBC 5.7  NEUTROABS 2.0  HGB 12.6  HCT 39.8  MCV 78.8*  PLT 223   Cardiac Enzymes: No results for input(s): "CKTOTAL", "CKMB", "CKMBINDEX", "TROPONINI" in the last 168 hours. BNP: Invalid input(s): "POCBNP" CBG: Recent Labs  Lab 05/23/23 1347  GLUCAP 92   D-Dimer Recent Labs    05/24/23 0521  DDIMER 0.30   Hgb A1c No results for input(s): "HGBA1C" in the last 72  hours. Lipid Profile No results for input(s): "CHOL", "HDL", "LDLCALC", "TRIG", "CHOLHDL", "LDLDIRECT" in the last 72 hours. Thyroid function studies No results for input(s): "TSH", "T4TOTAL", "T3FREE", "THYROIDAB" in the last 72 hours.  Invalid input(s): "FREET3" Anemia work up No results for input(s): "VITAMINB12", "FOLATE", "FERRITIN", "TIBC", "IRON", "RETICCTPCT" in the last 72 hours. Urinalysis    Component Value Date/Time   COLORURINE YELLOW 05/23/2023 1745   APPEARANCEUR HAZY (A) 05/23/2023 1745   APPEARANCEUR Hazy 04/21/2013 0330   LABSPEC 1.030 05/23/2023 1745   LABSPEC 1.023 04/21/2013 0330   PHURINE 6.0 05/23/2023 1745   GLUCOSEU NEGATIVE 05/23/2023 1745   GLUCOSEU Negative 04/21/2013 0330   HGBUR NEGATIVE 05/23/2023 1745   BILIRUBINUR NEGATIVE 05/23/2023 1745   BILIRUBINUR Negative 04/21/2013 0330   KETONESUR NEGATIVE 05/23/2023 1745   PROTEINUR NEGATIVE 05/23/2023 1745   NITRITE NEGATIVE 05/23/2023 1745   LEUKOCYTESUR NEGATIVE 05/23/2023 1745   LEUKOCYTESUR 3+ 04/21/2013 0330   Sepsis Labs Recent Labs  Lab 05/23/23 1406  WBC 5.7   Microbiology No results found for this or any previous visit (from the past 240  hours).   Time coordinating discharge: Over 30 minutes  SIGNED:   Huey Bienenstock, MD  [facility] 05/26/2023, 4:46 PM Pager   If 7PM-7AM, please contact night-coverage [email or web address] Password [email or web address]

## 2023-05-26 NOTE — Progress Notes (Signed)
 PROGRESS NOTE    Heather Figueroa  MVH:[id number] DOB: 01-10-1971 DOA: 05/23/2023 PCP: Kandyce Rud, MD   Brief Narrative:   HPI: Heather Figueroa is a 53 y.o. female with medical history significant of hypertension, history of functional neurologic disorder with gait impairment, seizures, CVA/TIA, anxiety/depression, chronic constipation, GERD, history of PFO, prediabetes, tobacco abuse presented to the ED via EMS from work for evaluation of seizure and chest pain.  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 ,  Given multiple different inconsistent neurological complaints, and consistent physical exam, so psychiatry were consulted, with patient has climbed their involvement.   Assessment & Plan:   Principal Problem:   Seizure [facility]) Active Problems:   Chest pain   Prediabetes   Essential hypertension   Anxiety and depression   Nonepileptiform seizures -Patient with history of functional neurologic disorder with gait impairment and also history of seizure disorder on Lamictal presented after she had a witnessed seizure at work.   -.Deceived IV Keppra in ED, no recurrence of seizures during hospital stay s -MRI brain with no acute findings, EEG with no acute findings. -Concern for functional neurological disorder . -Physical exam is inconsistent, Javor consulted, they as well felt this is nonepileptic seizures, recommended psychiatry consult, patient has declined their input. -Continue with Lamictal  -Patient reports she is following with a psychologist as an outpatient [name] as an outpatient for last 4 years.  Chest pain/neck pain Chest x-ray showing no acute cardiopulmonary process.  Workup not  suggestive of ACS.  D-dimer negative.  Thromboembolism ruled out.  Although she is complaining of neck pain but she is too lethargic to receive any opioid medications.   -Report right shoulder pain, x-ray with no acute findings  Prediabetes Recent A1c 6.4 on 05/02/2023.  Blood sugar controlled.   Hypertension: Blood pressure remained stable. On amlodipine and Toprol-XL but hold Benicar and Minipress for now.  Anxiety/depression: Resume Seroquel, Effexor and trazodone.  GERD: Resume PPI.  DVT prophylaxis: enoxaparin (LOVENOX) injection 40 mg Start: 05/24/23 1600   Code Status: Full Code  Family Communication:  None present at bedside.   Status is: Observation The patient will require care spanning > 2 midnights and should be moved to inpatient because: Needs EEG, PT OT and SLP assessment.  Still very lethargic.   Estimated body mass index is 31.24 kg/m as calculated from the following:   Height as of this encounter: 5\' 1"  (1.549 m).   Weight as of this encounter: 75 kg.    Nutritional Assessment: Body mass index is 31.24 kg/m.Marland Kitchen Seen by dietician.  I agree with the assessment and plan as outlined below: Nutrition Status:        . Skin Assessment: I have examined the patient's skin and I agree with the wound assessment as performed by the wound care RN as outlined below:    Consultants:  Neurology  Psychiatry  Procedures:  None  Antimicrobials:  Anti-infectives (From admission, onward)    None         Subjective: Complain of musculoskeletal right shoulder pain  Objective: Vitals:   05/25/23 2316 05/26/23 0400 05/26/23 0800 05/26/23 1200  BP: 104/62 99/60 125/78 139/75  Pulse: 65 (!)  58 (!) 57 69  Resp: 13 12 10 16   Temp: 98.2 F (36.8 C) 98.4 F (36.9 C) 97.7 F (36.5 C) 97.8 F (36.6 C)  TempSrc: Oral Oral Oral Oral  SpO2: 96% 95% 97% 97%  Weight:      Height:        Intake/Output Summary (Last 24 hours) at 05/26/2023 1518 Last data filed at  05/26/2023 0600 Gross per 24 hour  Intake --  Output 500 ml  Net -500 ml   Filed Weights   05/23/23 1354  Weight: 75 kg    Examination:  Awake Alert, Oriented X 3, No new F.N deficits, Normal affect Symmetrical Chest wall movement, Good air movement bilaterally, CTAB RRR,No Gallops,Rubs or new Murmurs, No Parasternal Heave +ve B.Sounds, Abd Soft, No tenderness, No rebound - guarding or rigidity. No Cyanosis, Clubbing or edema, No new Rash or bruise      Data Reviewed: I have personally reviewed following labs and imaging studies  CBC: Recent Labs  Lab 05/23/23 1406  WBC 5.7  NEUTROABS 2.0  HGB 12.6  HCT 39.8  MCV 78.8*  PLT 223   Basic Metabolic Panel: Recent Labs  Lab 05/23/23 1406  NA 142  K 3.6  CL 109  CO2 24  GLUCOSE 96  BUN 13  CREATININE 0.80  CALCIUM 9.7   GFR: Estimated Creatinine Clearance: 77.1 mL/min (by C-G formula based on SCr of 0.8 mg/dL). Liver Function Tests: Recent Labs  Lab 05/23/23 1406  AST 19  ALT 16  ALKPHOS 102  BILITOT 0.3  PROT 7.0  ALBUMIN 4.1   No results for input(s): "LIPASE", "AMYLASE" in the last 168 hours. No results for input(s): "AMMONIA" in the last 168 hours. Coagulation Profile: Recent Labs  Lab 05/23/23 1521  INR 1.1   Cardiac Enzymes: No results for input(s): "CKTOTAL", "CKMB", "CKMBINDEX", "TROPONINI" in the last 168 hours. BNP (last 3 results) No results for input(s): "PROBNP" in the last 8760 hours. HbA1C: No results for input(s): "HGBA1C" in the last 72 hours. CBG: Recent Labs  Lab 05/23/23 1347  GLUCAP 92   Lipid Profile: No results for input(s): "CHOL", "HDL", "LDLCALC", "TRIG", "CHOLHDL", "LDLDIRECT" in the last 72 hours. Thyroid Function Tests: No results for input(s): "TSH", "T4TOTAL", "FREET4", "T3FREE", "THYROIDAB" in the last 72 hours. Anemia Panel: No results for input(s): "VITAMINB12", "FOLATE", "FERRITIN", "TIBC", "IRON", "RETICCTPCT" in the last 72 hours. Sepsis Labs: Recent  Labs  Lab 05/23/23 1500 05/23/23 1607  LATICACIDVEN 0.6 0.7    No results found for this or any previous visit (from the past 240 hours).   Radiology Studies: No results found.   Scheduled Meds:  amLODipine  5 mg Oral Q1200   enoxaparin (LOVENOX) injection  40 mg Subcutaneous Q24H   feeding supplement  237 mL Oral BID BM   lamoTRIgine  100 mg Oral Daily   linaclotide  72 mcg Oral QAC breakfast   metoprolol succinate  25 mg Oral Q2200   pantoprazole  40 mg Oral q morning   QUEtiapine  300 mg Oral QHS   traZODone  150 mg Oral QHS   venlafaxine XR  150 mg Oral Q breakfast   Continuous Infusions:     LOS: 2 days   Huey Bienenstock, MD [facility]  05/26/2023, 3:18 PM   *Please note that this is a verbal dictation therefore any spelling or grammatical errors are due to the "Dragon [employer] One" system interpretation.  Please page via Amion and do not message via  secure chat for urgent patient care matters. Secure chat can be used for non urgent patient care matters.  How to contact the [facility] Attending or Consulting provider 7A - 7P or covering provider during after hours 7P -7A, for this patient?  Check the care team in [facility] and look for a) attending/consulting [facility] provider listed and b) the [facility] team listed. Page or secure chat 7A-7P. Log into [email or web address] and use [place]'s universal password to access. If you do not have the password, please contact the hospital operator. Locate the [facility] provider you are looking for under [facility] and page to a number that you can be directly reached. If you still have difficulty reaching the provider, please page the [facility] (Director on Call) for the Hospitalists listed on amion for assistance.

## 2023-06-22 ENCOUNTER — Other Ambulatory Visit: Payer: Self-pay

## 2023-06-22 ENCOUNTER — Encounter: Payer: Self-pay | Admitting: Physical Therapy

## 2023-06-22 ENCOUNTER — Ambulatory Visit: Payer: MEDICAID | Attending: Family Medicine | Admitting: Physical Therapy

## 2023-06-22 DIAGNOSIS — R269 Unspecified abnormalities of gait and mobility: Secondary | ICD-10-CM | POA: Diagnosis present

## 2023-06-22 DIAGNOSIS — R262 Difficulty in walking, not elsewhere classified: Secondary | ICD-10-CM | POA: Diagnosis present

## 2023-06-22 DIAGNOSIS — M6281 Muscle weakness (generalized): Secondary | ICD-10-CM | POA: Insufficient documentation

## 2023-06-22 DIAGNOSIS — R278 Other lack of coordination: Secondary | ICD-10-CM | POA: Insufficient documentation

## 2023-06-22 NOTE — Therapy (Signed)
 OUTPATIENT PHYSICAL THERAPY NEURO EVALUATION   Patient Name: Heather Figueroa MRN: [id number] DOB:February 03, 1971, 53 y.o., female Today's Date: 06/22/2023   PCP: Nestor Banter, MD  REFERRING PROVIDER: Nestor Banter, MD   END OF SESSION:  PT End of Session - 06/22/23 1148     Visit Number 1    Number of Visits 24    Date for PT Re-Evaluation 09/14/23    PT Start Time 1148    PT Stop Time 1230    PT Time Calculation (min) 42 min    Equipment Utilized During Treatment Gait belt    Activity Tolerance Patient tolerated treatment well    Behavior During Therapy [facility] for tasks assessed/performed             Past Medical History:  Diagnosis Date   Anemia    Anxiety    Arthritis    Depression    Headache    Hypertension    Neuromuscular disorder (HCC)    Seizures (HCC)    pt has had 2 seizures in July 2018   Stroke [facility])    possible TIA in July 2018   Past Surgical History:  Procedure Laterality Date   ABDOMINAL HYSTERECTOMY     2002   ANTERIOR CERVICAL DECOMP/DISCECTOMY FUSION N/A 01/26/2017   Procedure: ANTERIOR CERVICAL DECOMPRESSION/DISCECTOMY FUSION, INTERBODY PROSTHESIS, PLATE/SCREWS, POSSIBLE CORPECTOMY CERVICAL FOUR- CERVICAL FIVE, CERVICAL FIVE- CERVICAL SIX, CERVICAL SIX- CERVICAL SEVEN;  Surgeon: Garry Kansas, MD;  Location: [facility] OR;  Service: Neurosurgery;  Laterality: N/A;  ANTERIOR CERVICAL DECOMPRESSION/DISCECTOMY FUSION, INTERBODY PROSTHESIS, PLATE/SCREWS, POSSIBLE CORPE   APPENDECTOMY     2010   COLONOSCOPY N/A 04/05/2022   Procedure: COLONOSCOPY;  Surgeon: Quintin Buckle, DO;  Location: [facility] ENDOSCOPY;  Service: Gastroenterology;  Laterality: N/A;   ESOPHAGOGASTRODUODENOSCOPY N/A 04/05/2022   Procedure: ESOPHAGOGASTRODUODENOSCOPY (EGD);  Surgeon: Quintin Buckle, DO;  Location: [facility] ENDOSCOPY;  Service: Gastroenterology;  Laterality: N/A;   OVARIAN CYST REMOVAL     TUBAL LIGATION     1998   Patient Active Problem List   Diagnosis Date  Noted   Prediabetes 05/24/2023   Essential hypertension 05/24/2023   Anxiety and depression 05/24/2023   Seizure (HCC) 05/23/2023   ASD (atrial septal defect): Possible 06/20/2021   Tobacco abuse 06/20/2021   Hypokalemia 06/19/2021   Chest pain 06/18/2021   Seizure disorder (HCC) 06/18/2021   Well woman exam with routine gynecological exam 01/16/2019   Cervical spondylosis with myelopathy and radiculopathy 01/26/2017    ONSET DATE: 05/23/2023  REFERRING DIAG:  Diagnosis  R53.1 (ICD-10-CM) - Weakness    THERAPY DIAG:  No diagnosis found.  Rationale for Evaluation and Treatment: Rehabilitation  SUBJECTIVE:  SUBJECTIVE STATEMENT: Pt reports that she had Seizure and CVA  roughly 3-4 weeks ago followed by hospital admission. Reports that R side, UE and LE were both affected. States that she now has difficulty with gait, bathing, transfers, and dressing tasks due to increased R side weakness. Due to lack of payor difficulties, she Reports that she received no PT or OT  following recent hospitalizaiton. Neck surgery in 2018, following which she had to "re-learn how to walk" Reports that she started having weakness in 2017 "after they brought me back to life".   Pt accompanied by: self  PERTINENT HISTORY:    From recent hospitalization:  "53 y.o. female presented to the ED via EMS from work for evaluation of seizure and chest pain. Patient reportedly had a seizure while at work ; on EMS arrival, patient was postictal. She had difficulty moving her lower extremities and difficulty speaking; EEG ordered; with medical history significant of hypertension, history of functional neurologic disorder with gait impairment, seizures, CVA/TIA, anxiety/depression, chronic constipation, GERD, history of PFO,  prediabetes, tobacco abuse "  Hx of Functional movement disorder; CVA/TIA. ACDF in 2018; sent home with HHPT at that time.  PAIN:  Are you having pain? Yes: NPRS scale: 8.5/10  Pain location: R arm  Pain description: stabbing. Hurt.  Aggravating factors: sleeping on R side and lifting  Relieving factors: heat.   PRECAUTIONS: Fall  RED FLAGS: Cervical red flags: hx of cervical fusion    WEIGHT BEARING RESTRICTIONS:  limited by strength, no official restriction from Cspine surgery 2018  FALLS: Has patient fallen in last 6 months? No  LIVING ENVIRONMENT: Lives with: lives with their son Lives in: House/apartment Stairs: Yes: External: 2 steps; none deep steps that walker can fit  Has following equipment at home: Walker - 2 wheeled  PLOF: Independent with basic ADLs and Independent with household mobility without device  PATIENT GOALS: Get stronger. Walk without walker  OBJECTIVE:  Note: Objective measures were completed at Evaluation unless otherwise noted.  DIAGNOSTIC FINDINGS:   IMPRESSION: 1. No acute intracranial abnormality. 2. No seizure focus identified. 3. Findings of chronic small vessel ischemia.  COGNITION: Overall cognitive status: Within functional limits for tasks assessed   SENSATION: Light touch: Impaired   COORDINATION: Finger to nose: mild ataxia and decreased ROM due to strength/pain  Ankle knee:   EDEMA:  WFL  MUSCLE TONE: inconsistent   DTRs:   POSTURE: rounded shoulders, forward head, and elevated shoulders  LOWER EXTREMITY ROM:    To be assessed   LOWER EXTREMITY MMT:    MMT Right Eval Left Eval  Hip flexion 3 4-  Hip extension 3+ 4  Hip abduction 4- 4-  Hip adduction 4- 4-  Hip internal rotation    Hip external rotation    Knee flexion 2 4-  Knee extension 3 4  Ankle dorsiflexion 2 4-  Ankle plantarflexion    Ankle inversion    Ankle eversion    (Blank rows = not tested)  BED MOBILITY:  Not  tested  TRANSFERS: Sit to stand: SBA  Assistive device utilized: Environmental consultant - 2 wheeled     Stand to sit: SBA  Assistive device utilized: Environmental consultant - 2 wheeled     Chair to chair: SBA  Assistive device utilized: Environmental consultant - 2 wheeled       RAMP:  Not tested  CURB:  Not tested  STAIRS: Not tested GAIT: Findings: Gait Characteristics: step to pattern, step through pattern, decreased stride length, Right  steppage, Right foot flat, and poor foot clearance- Right, Distance walked: inconsistent step to leading with RLE and then LLE, Assistive device utilized:Walker - 2 wheeled, Level of assistance: CGA, and Comments: inconstitent step to pattern, inconsistent foot drop drag on the RLE. Noted to have midfoot contact in turns, but toe contact   FUNCTIONAL TESTS:  5 times sit to stand: 35.95 Timed up and go (TUG): 1:34 min with RW  10 meter walk test: 2:11 min with RW. 0.51m/s  Berg Balance Scale: TBD  PATIENT SURVEYS:  ABC scale 18%                                                                                                                              TREATMENT DATE: 06/22/2023  Eval Only     PATIENT EDUCATION: Education details: POC Person educated: Patient Education method: Explanation Education comprehension: verbalized understanding  HOME EXERCISE PROGRAM: To be given at visit 2   GOALS: Goals reviewed with patient? Yes   SHORT TERM GOALS: Target date: 07/20/2023    Patient will be independent in home exercise program to improve strength/mobility for better functional independence with ADLs. Baseline: to be given on visit 2.  Goal status: INITIAL   LONG TERM GOALS: Target date: 09/14/2023     Patient will increase ABC score to equal to or greater than   15points   to demonstrate statistically significant improvement in mobility and quality of life.  Baseline: 18 Goal status: INITIAL  2.  Patient (> 31 years old) will complete five times sit to stand test in < 15  seconds indicating an increased LE strength and improved balance. Baseline: 35.95sec Goal status: INITIAL  3.  Patient will increase Berg Balance score by > 6 points to demonstrate decreased fall risk during functional activities Baseline: to be completed  Goal status: INITIAL  4.  Patient will increase 10 meter walk test to >0.58m/s as to improve gait speed for better community ambulation and to reduce fall risk. Baseline: 0.77m/s Goal status: INITIAL  5.  Patient will reduce timed up and go to <11 seconds to reduce fall risk and demonstrate improved transfer/gait ability. Baseline: 1:34 min with RW Goal status: INITIAL    ASSESSMENT:  CLINICAL IMPRESSION: Patient is a 53 y.o. Female who was seen today for physical therapy evaluation and treatment for weakness following recent hospitalization. Pt was reported to have seizure like activity with possible CVA. Imaging does not support acute CVA. Throughout PT assessment on this day day. Pt noted to have ataxia, weakness, decreased RLE sensation, abnormality of gait. Inconsistent gait pattern with occasional hypertonic appearance in stance on the RLE gastrov and decreased ankle DF, but was noted to have occasional midfoot contact with turns in preparation of transfer to sitting in chair and while distracted. Throughout standardizes outcome measures, pt noted to have increased fall risk with TUG 1:34 min with RW, 10 mWT: 0.07 m/s, and 5x STS 35.95 with UE  support. Pt scores 18% on ABC. Pt will benefit from skilled PT to address focal weakness and abnormalities of gait and improved safety and independence to allow return to PLOF.   OBJECTIVE IMPAIRMENTS: Abnormal gait, decreased activity tolerance, decreased balance, decreased coordination, decreased endurance, decreased knowledge of condition, decreased knowledge of use of DME, decreased mobility, difficulty walking, decreased ROM, decreased strength, impaired sensation, impaired UE functional use,  improper body mechanics, and pain.   ACTIVITY LIMITATIONS: lifting, standing, squatting, sleeping, transfers, bed mobility, bathing, reach over head, hygiene/grooming, locomotion level, and caring for others  PARTICIPATION LIMITATIONS: cleaning, laundry, driving, shopping, community activity, occupation, and yard work  PERSONAL FACTORS: 3+ comorbidities: HTN, seizures, CVA, functional movement disorder  are also affecting patient's functional outcome.   REHAB POTENTIAL: Excellent  CLINICAL DECISION MAKING: Evolving/moderate complexity  EVALUATION COMPLEXITY: Moderate  PLAN:  PT FREQUENCY: 1-2x/week  PT DURATION: 12 weeks  PLANNED INTERVENTIONS: 97164- PT Re-evaluation, 97750- Physical Performance Testing, 97110-Therapeutic exercises, 97530- Therapeutic activity, [id number]- Neuromuscular re-education, 97535- Self Care, [id number]- Manual therapy, [phone]- Gait training, [phone]- Orthotic Initial, [phone]- Orthotic/Prosthetic subsequent, [phone]- Aquatic Therapy, Patient/Family education, Balance training, Stair training, Taping, Dry Needling, Joint mobilization, Joint manipulation, Spinal manipulation, Spinal mobilization, Vestibular training, Visual/preceptual remediation/compensation, DME instructions, Cryotherapy, and Moist heat  PLAN FOR NEXT SESSION:  Completed Berg. Initiate HEP.    Barbara Book, PT 06/22/2023, 11:51 AM

## 2023-06-24 ENCOUNTER — Ambulatory Visit: Payer: MEDICAID | Admitting: Physical Therapy

## 2023-06-27 ENCOUNTER — Ambulatory Visit: Payer: MEDICAID | Admitting: Physical Therapy

## 2023-06-29 ENCOUNTER — Ambulatory Visit: Payer: MEDICAID | Attending: Family Medicine | Admitting: Physical Therapy

## 2023-06-29 DIAGNOSIS — R269 Unspecified abnormalities of gait and mobility: Secondary | ICD-10-CM

## 2023-06-29 DIAGNOSIS — R262 Difficulty in walking, not elsewhere classified: Secondary | ICD-10-CM

## 2023-06-29 DIAGNOSIS — M6281 Muscle weakness (generalized): Secondary | ICD-10-CM

## 2023-06-29 DIAGNOSIS — R278 Other lack of coordination: Secondary | ICD-10-CM

## 2023-06-29 NOTE — Therapy (Signed)
 OUTPATIENT PHYSICAL THERAPY NEURO EVALUATION   Patient Name: Heather Figueroa MRN: [id number] DOB:[birth year], 53 y.o., female Today's Date: 06/29/2023   PCP: Nestor Banter, MD  REFERRING PROVIDER: Nestor Banter, MD   END OF SESSION:  PT End of Session - 06/29/23 [phone]     Visit Number 2    Number of Visits 24    Date for PT Re-Evaluation 09/14/23    PT Start Time 0935    PT Stop Time 1015    PT Time Calculation (min) 40 min    Equipment Utilized During Treatment Gait belt    Activity Tolerance Patient tolerated treatment well    Behavior During Therapy [facility] for tasks assessed/performed             Past Medical History:  Diagnosis Date   Anemia    Anxiety    Arthritis    Depression    Headache    Hypertension    Neuromuscular disorder (HCC)    Seizures (HCC)    pt has had 2 seizures in July 2018   Stroke [facility])    possible TIA in July 2018   Past Surgical History:  Procedure Laterality Date   ABDOMINAL HYSTERECTOMY     2002   ANTERIOR CERVICAL DECOMP/DISCECTOMY FUSION N/A 01/26/2017   Procedure: ANTERIOR CERVICAL DECOMPRESSION/DISCECTOMY FUSION, INTERBODY PROSTHESIS, PLATE/SCREWS, POSSIBLE CORPECTOMY CERVICAL FOUR- CERVICAL FIVE, CERVICAL FIVE- CERVICAL SIX, CERVICAL SIX- CERVICAL SEVEN;  Surgeon: Garry Kansas, MD;  Location: [facility] OR;  Service: Neurosurgery;  Laterality: N/A;  ANTERIOR CERVICAL DECOMPRESSION/DISCECTOMY FUSION, INTERBODY PROSTHESIS, PLATE/SCREWS, POSSIBLE CORPE   APPENDECTOMY     2010   COLONOSCOPY N/A 04/05/2022   Procedure: COLONOSCOPY;  Surgeon: Quintin Buckle, DO;  Location: [facility] ENDOSCOPY;  Service: Gastroenterology;  Laterality: N/A;   ESOPHAGOGASTRODUODENOSCOPY N/A 04/05/2022   Procedure: ESOPHAGOGASTRODUODENOSCOPY (EGD);  Surgeon: Quintin Buckle, DO;  Location: [facility] ENDOSCOPY;  Service: Gastroenterology;  Laterality: N/A;   OVARIAN CYST REMOVAL     TUBAL LIGATION     1998   Patient Active Problem List   Diagnosis Date  Noted   Prediabetes 05/24/2023   Essential hypertension 05/24/2023   Anxiety and depression 05/24/2023   Seizure (HCC) 05/23/2023   ASD (atrial septal defect): Possible 06/20/2021   Tobacco abuse 06/20/2021   Hypokalemia 06/19/2021   Chest pain 06/18/2021   Seizure disorder (HCC) 06/18/2021   Well woman exam with routine gynecological exam 01/16/2019   Cervical spondylosis with myelopathy and radiculopathy 01/26/2017    ONSET DATE: 05/23/2023  REFERRING DIAG:  Diagnosis  R53.1 (ICD-10-CM) - Weakness    THERAPY DIAG:  Difficulty in walking, not elsewhere classified  Other lack of coordination  Muscle weakness (generalized)  Abnormality of gait  Rationale for Evaluation and Treatment: Rehabilitation  SUBJECTIVE:  SUBJECTIVE STATEMENT:  Pt reports that she has had some RLE pain for the last few days, but improved today. Also states that she is still limited by LUE weakness and soreness.   Pt reports that she had Seizure and CVA  roughly 3-4 weeks ago followed by hospital admission. Reports that R side, UE and LE were both affected. States that she now has difficulty with gait, bathing, transfers, and dressing tasks due to increased R side weakness. Due to lack of payor difficulties, she Reports that she received no PT or OT  following recent hospitalizaiton. Neck surgery in 2018, following which she had to "re-learn how to walk" Reports that she started having weakness in 2017 "after they brought me back to life".   Pt accompanied by: self  PERTINENT HISTORY:    From recent hospitalization:  "53 y.o. female presented to the ED via EMS from work for evaluation of seizure and chest pain. Patient reportedly had a seizure while at work ; on EMS arrival, patient was postictal. She had difficulty  moving her lower extremities and difficulty speaking; EEG ordered; with medical history significant of hypertension, history of functional neurologic disorder with gait impairment, seizures, CVA/TIA, anxiety/depression, chronic constipation, GERD, history of PFO, prediabetes, tobacco abuse "  Hx of Functional movement disorder; CVA/TIA. ACDF in 2018; sent home with HHPT at that time.  PAIN:  Are you having pain? Yes: NPRS scale: 8.5/10  Pain location: R arm  Pain description: stabbing. Hurt.  Aggravating factors: sleeping on R side and lifting  Relieving factors: heat.   PRECAUTIONS: Fall  RED FLAGS: Cervical red flags: hx of cervical fusion    WEIGHT BEARING RESTRICTIONS:  limited by strength, no official restriction from Cspine surgery 2018  FALLS: Has patient fallen in last 6 months? No  LIVING ENVIRONMENT: Lives with: lives with their son Lives in: House/apartment Stairs: Yes: External: 2 steps; none deep steps that walker can fit  Has following equipment at home: Walker - 2 wheeled  PLOF: Independent with basic ADLs and Independent with household mobility without device  PATIENT GOALS: Get stronger. Walk without walker  OBJECTIVE:  Note: Objective measures were completed at Evaluation unless otherwise noted.  DIAGNOSTIC FINDINGS:   IMPRESSION: 1. No acute intracranial abnormality. 2. No seizure focus identified. 3. Findings of chronic small vessel ischemia.  COGNITION: Overall cognitive status: Within functional limits for tasks assessed   SENSATION: Light touch: Impaired   COORDINATION: Finger to nose: mild ataxia and decreased ROM due to strength/pain  Ankle knee:   EDEMA:  WFL  MUSCLE TONE: inconsistent   DTRs:   POSTURE: rounded shoulders, forward head, and elevated shoulders  LOWER EXTREMITY ROM:    To be assessed   LOWER EXTREMITY MMT:    MMT Right Eval Left Eval  Hip flexion 3 4-  Hip extension 3+ 4  Hip abduction 4- 4-  Hip adduction  4- 4-  Hip internal rotation    Hip external rotation    Knee flexion 2 4-  Knee extension 3 4  Ankle dorsiflexion 2 4-  Ankle plantarflexion    Ankle inversion    Ankle eversion    (Blank rows = not tested)  BED MOBILITY:  Not tested  TRANSFERS: Sit to stand: SBA  Assistive device utilized: Environmental consultant - 2 wheeled     Stand to sit: SBA  Assistive device utilized: Environmental consultant - 2 wheeled     Chair to chair: SBA  Assistive device utilized: Environmental consultant - 2 wheeled  RAMP:  Not tested  CURB:  Not tested  STAIRS: Not tested GAIT: Findings: Gait Characteristics: step to pattern, step through pattern, decreased stride length, Right steppage, Right foot flat, and poor foot clearance- Right, Distance walked: inconsistent step to leading with RLE and then LLE, Assistive device utilized:Walker - 2 wheeled, Level of assistance: CGA, and Comments: inconstitent step to pattern, inconsistent foot drop drag on the RLE. Noted to have midfoot contact in turns, but toe contact   FUNCTIONAL TESTS:  5 times sit to stand: 35.95 Timed up and go (TUG): 1:34 min with RW  10 meter walk test: 2:11 min with RW. 0.35m/s  Berg Balance Scale: 31  PATIENT SURVEYS:  ABC scale 18%                                                                                                                              TREATMENT DATE: 06/29/2023  Patient demonstrates increased fall risk as noted by score of   31/56 on Berg Balance Scale.  (<36= high risk for falls, close to 100%; 37-45 significant >80%; 46-51 moderate >50%; 52-55 lower >25%)  [facility] PT Assessment - 06/29/23 0001       Berg Balance Test   Sit to Stand Able to stand using hands after several tries    Standing Unsupported Able to stand 2 minutes with supervision    Sitting with Back Unsupported but Feet Supported on Floor or Stool Able to sit safely and securely 2 minutes    Stand to Sit Uses backs of legs against chair to control descent    Transfers Able to  transfer with verbal cueing and /or supervision    Standing Unsupported with Eyes Closed Able to stand 3 seconds    Standing Unsupported with Feet Together Able to place feet together independently and stand for 1 minute with supervision    From Standing, Reach Forward with Outstretched Arm Can reach forward >5 cm safely (2")    From Standing Position, Pick up Object from Floor Able to pick up shoe, needs supervision    From Standing Position, Turn to Look Behind Over each Shoulder Turn sideways only but maintains balance    Turn 360 Degrees Able to turn 360 degrees safely but slowly    Standing Unsupported, Alternately Place Feet on Step/Stool Able to complete >2 steps/needs minimal assist    Standing Unsupported, One Foot in Front Able to take small step independently and hold 30 seconds    Standing on One Leg Tries to lift leg/unable to hold 3 seconds but remains standing independently    Total Score 31             PT instructed pt in HEP.   Exercises - ankle pumps x 20 bil with cues fotr  - Sit to Stand with Armchair  - 2 sets - 6 reps - cues for sequencing of movement to reduce segmented movements and improve coordination of hip and  knee extension, as well as cues for at least 1 UE to push from chair - Standing March with Counter Support  - 10 reps - Heel Toe Raises with Counter Support   - 10 reps - Backward Walking with Counter Support   - 3 reps x 5 ft.   Gait with RW x 16ft with heel toes pattern and cues to keep forward momentum of RW to improve fluidity of gait pattern as well as cues improved knee flexion to swing RLE forward.   Noted to have mild tremor with standing and gait in BLE, but tremor ceased with turning to sit in WC.   PATIENT EDUCATION: Education details: POC. HEP. Pt educated throughout session about proper posture and technique with exercises. Improved exercise technique, movement at target joints, use of target muscles after min to mod verbal, visual,  tactile cues.  Person educated: Patient Education method: Explanation Education comprehension: verbalized understanding  HOME EXERCISE PROGRAM: Access Code: [email or web address] URL: [email or web address] Date: 06/29/2023 Prepared by: Aurora Lees  Exercises - Sit to Stand with Armchair  - 1 x daily - 4 x weekly - 3 sets - 6 reps - Standing March with Counter Support  - 1 x daily - 4 x weekly - 3 sets - 10 reps - Heel Toe Raises with Counter Support  - 1 x daily - 4 x weekly - 3 sets - 10 reps - Backward Walking with Counter Support  - 1 x daily - 4 x weekly - 3 sets - 5 reps  GOALS: Goals reviewed with patient? Yes   SHORT TERM GOALS: Target date: 07/20/2023    Patient will be independent in home exercise program to improve strength/mobility for better functional independence with ADLs. Baseline: to be given on visit 2.  Goal status: INITIAL   LONG TERM GOALS: Target date: 09/14/2023     Patient will increase ABC score to equal to or greater than   15points   to demonstrate statistically significant improvement in mobility and quality of life.  Baseline: 18 Goal status: INITIAL  2.  Patient (> 97 years old) will complete five times sit to stand test in < 15 seconds indicating an increased LE strength and improved balance. Baseline: 35.95sec Goal status: INITIAL  3.  Patient will increase Berg Balance score by > 6 points to demonstrate decreased fall risk during functional activities Baseline: to be completed  Goal status: INITIAL  4.  Patient will increase 10 meter walk test to >0.42m/s as to improve gait speed for better community ambulation and to reduce fall risk. Baseline: 0.33m/s Goal status: INITIAL  5.  Patient will reduce timed up and go to <11 seconds to reduce fall risk and demonstrate improved transfer/gait ability. Baseline: 1:34 min with RW Goal status: INITIAL    ASSESSMENT:  CLINICAL IMPRESSION: Patient is a 53 y.o. Female who was seen  today for physical therapy treatment for weakness following recent hospitalization. Pt was reported to have seizure like activity with possible CVA. Completed Berg balance Scale. 31/56, indicating increased fall risk. HEP provided with emphasis on improved fluidity of movement and reduce halting gait pattern as well as improve heel contact.  Pt will benefit from skilled PT to address focal weakness and abnormalities of gait and improved safety and independence to allow return to PLOF.   OBJECTIVE IMPAIRMENTS: Abnormal gait, decreased activity tolerance, decreased balance, decreased coordination, decreased endurance, decreased knowledge of condition, decreased knowledge of use of DME, decreased mobility, difficulty walking, decreased ROM, decreased strength,  impaired sensation, impaired UE functional use, improper body mechanics, and pain.   ACTIVITY LIMITATIONS: lifting, standing, squatting, sleeping, transfers, bed mobility, bathing, reach over head, hygiene/grooming, locomotion level, and caring for others  PARTICIPATION LIMITATIONS: cleaning, laundry, driving, shopping, community activity, occupation, and yard work  PERSONAL FACTORS: 3+ comorbidities: HTN, seizures, CVA, functional movement disorder  are also affecting patient's functional outcome.   REHAB POTENTIAL: Excellent  CLINICAL DECISION MAKING: Evolving/moderate complexity  EVALUATION COMPLEXITY: Moderate  PLAN:  PT FREQUENCY: 1-2x/week  PT DURATION: 12 weeks  PLANNED INTERVENTIONS: 97164- PT Re-evaluation, 97750- Physical Performance Testing, 97110-Therapeutic exercises, 97530- Therapeutic activity, [id number]- Neuromuscular re-education, 97535- Self Care, [id number]- Manual therapy, [phone]- Gait training, [phone]- Orthotic Initial, [phone]- Orthotic/Prosthetic subsequent, [phone]- Aquatic Therapy, Patient/Family education, Balance training, Stair training, Taping, Dry Needling, Joint mobilization, Joint manipulation, Spinal manipulation, Spinal  mobilization, Vestibular training, Visual/preceptual remediation/compensation, DME instructions, Cryotherapy, and Moist heat  PLAN FOR NEXT SESSION:   Re-assess HEP.  Gait training with heel contact and reduced stop/start pattern.   Barbara Book, PT 06/29/2023, 9:37 AM

## 2023-07-06 ENCOUNTER — Ambulatory Visit: Payer: MEDICAID

## 2023-07-11 ENCOUNTER — Ambulatory Visit: Payer: MEDICAID | Admitting: Physical Therapy

## 2023-07-11 ENCOUNTER — Telehealth: Payer: Self-pay | Admitting: Physical Therapy

## 2023-07-11 NOTE — Telephone Encounter (Signed)
 Pt contacted via telephone and Bolivar Bushman spoke with pt. Pt states that ride treatment never Showed up. Phone number given to pt to call front desk to reschedule or cancel next appointment. Author also  informed pt of future PT appointment date and time. 07/19/23 at 1015.  Aurora Lees PT, DPT  Physical Therapist - [place]  [facility]  9:10 AM 07/11/23

## 2023-07-19 ENCOUNTER — Ambulatory Visit: Payer: MEDICAID | Admitting: Physical Therapy

## 2023-07-19 DIAGNOSIS — R278 Other lack of coordination: Secondary | ICD-10-CM

## 2023-07-19 DIAGNOSIS — R262 Difficulty in walking, not elsewhere classified: Secondary | ICD-10-CM

## 2023-07-19 DIAGNOSIS — R269 Unspecified abnormalities of gait and mobility: Secondary | ICD-10-CM

## 2023-07-19 DIAGNOSIS — M6281 Muscle weakness (generalized): Secondary | ICD-10-CM

## 2023-07-19 NOTE — Therapy (Signed)
 OUTPATIENT PHYSICAL THERAPY NEURO TREATMENT   Patient Name: Heather Figueroa MRN: [id number] DOB:07-14-1970, 53 y.o., female Today's Date: 07/19/2023   PCP: Nestor Banter, MD  REFERRING PROVIDER: Nestor Banter, MD   END OF SESSION:   PT End of Session - 07/19/23 1021     Visit Number 3    Number of Visits 24    Date for PT Re-Evaluation 09/14/23    PT Start Time 1020    PT Stop Time 1100    PT Time Calculation (min) 40 min    Equipment Utilized During Treatment Gait belt    Activity Tolerance Patient tolerated treatment well    Behavior During Therapy [facility] for tasks assessed/performed              Past Medical History:  Diagnosis Date   Anemia    Anxiety    Arthritis    Depression    Headache    Hypertension    Neuromuscular disorder (HCC)    Seizures (HCC)    pt has had 2 seizures in July 2018   Stroke [facility])    possible TIA in July 2018   Past Surgical History:  Procedure Laterality Date   ABDOMINAL HYSTERECTOMY     2002   ANTERIOR CERVICAL DECOMP/DISCECTOMY FUSION N/A 01/26/2017   Procedure: ANTERIOR CERVICAL DECOMPRESSION/DISCECTOMY FUSION, INTERBODY PROSTHESIS, PLATE/SCREWS, POSSIBLE CORPECTOMY CERVICAL FOUR- CERVICAL FIVE, CERVICAL FIVE- CERVICAL SIX, CERVICAL SIX- CERVICAL SEVEN;  Surgeon: Garry Kansas, MD;  Location: [facility] OR;  Service: Neurosurgery;  Laterality: N/A;  ANTERIOR CERVICAL DECOMPRESSION/DISCECTOMY FUSION, INTERBODY PROSTHESIS, PLATE/SCREWS, POSSIBLE CORPE   APPENDECTOMY     2010   COLONOSCOPY N/A 04/05/2022   Procedure: COLONOSCOPY;  Surgeon: Quintin Buckle, DO;  Location: [facility] ENDOSCOPY;  Service: Gastroenterology;  Laterality: N/A;   ESOPHAGOGASTRODUODENOSCOPY N/A 04/05/2022   Procedure: ESOPHAGOGASTRODUODENOSCOPY (EGD);  Surgeon: Quintin Buckle, DO;  Location: [facility] ENDOSCOPY;  Service: Gastroenterology;  Laterality: N/A;   OVARIAN CYST REMOVAL     TUBAL LIGATION     1998   Patient Active Problem List   Diagnosis  Date Noted   Prediabetes 05/24/2023   Essential hypertension 05/24/2023   Anxiety and depression 05/24/2023   Seizure (HCC) 05/23/2023   ASD (atrial septal defect): Possible 06/20/2021   Tobacco abuse 06/20/2021   Hypokalemia 06/19/2021   Chest pain 06/18/2021   Seizure disorder (HCC) 06/18/2021   Well woman exam with routine gynecological exam 01/16/2019   Cervical spondylosis with myelopathy and radiculopathy 01/26/2017    ONSET DATE: 05/23/2023  REFERRING DIAG:  Diagnosis  R53.1 (ICD-10-CM) - Weakness    THERAPY DIAG:  Difficulty in walking, not elsewhere classified  Other lack of coordination  Muscle weakness (generalized)  Abnormality of gait  Rationale for Evaluation and Treatment: Rehabilitation  SUBJECTIVE:  SUBJECTIVE STATEMENT:  Pt reports she is doing OK. States she is having pain in R lateral lower leg and ankle, reporting it as "steady" pain that's "not too sharp." Pt denies use of any brace on R ankle. Reports she stumbled this past Saturday, but was able to catch herself prior to falling. Pt states when she is at home she holds onto the walls/furniture walks, doesn't use walker unless needed. Pt reports HEP is going well and she does them 2x/day.    Initial Eval: Pt reports that she had Seizure and CVA  roughly 3-4 weeks ago followed by hospital admission. Reports that R side, UE and LE were both affected. States that she now has difficulty with gait, bathing, transfers, and dressing tasks due to increased R side weakness. Due to lack of payor difficulties, she Reports that she received no PT or OT  following recent hospitalizaiton. Neck surgery in 2018, following which she had to "re-learn how to walk" Reports that she started having weakness in 2017 "after they brought me back  to life".   Pt accompanied by: self  PERTINENT HISTORY:    From recent hospitalization:  "53 y.o. female presented to the ED via EMS from work for evaluation of seizure and chest pain. Patient reportedly had a seizure while at work ; on EMS arrival, patient was postictal. She had difficulty moving her lower extremities and difficulty speaking; EEG ordered; with medical history significant of hypertension, history of functional neurologic disorder with gait impairment, seizures, CVA/TIA, anxiety/depression, chronic constipation, GERD, history of PFO, prediabetes, tobacco abuse "  Hx of Functional movement disorder; CVA/TIA. ACDF in 2018; sent home with HHPT at that time.  PAIN:  Are you having pain? Yes: NPRS scale: 8.5/10  Pain location: R arm  Pain description: stabbing. Hurt.  Aggravating factors: sleeping on R side and lifting  Relieving factors: heat.   PRECAUTIONS: Fall  RED FLAGS: Cervical red flags: hx of cervical fusion   WEIGHT BEARING RESTRICTIONS: limited by strength, no official restriction from Cspine surgery 2018  FALLS: Has patient fallen in last 6 months? No  LIVING ENVIRONMENT: Lives with: lives with their son Lives in: House/apartment Stairs: Yes: External: 2 steps; none deep steps that walker can fit  Has following equipment at home: Walker - 2 wheeled  PLOF: Independent with basic ADLs and Independent with household mobility without device  PATIENT GOALS: Get stronger. Walk without walker  OBJECTIVE:  Note: Objective measures were completed at Evaluation unless otherwise noted.  DIAGNOSTIC FINDINGS:   IMPRESSION: 1. No acute intracranial abnormality. 2. No seizure focus identified. 3. Findings of chronic small vessel ischemia.  COGNITION: Overall cognitive status: Within functional limits for tasks assessed   SENSATION: Light touch: Impaired   COORDINATION: Finger to nose: mild ataxia and decreased ROM due to strength/pain  Ankle knee:    EDEMA:  WFL  MUSCLE TONE: inconsistent   DTRs:   POSTURE: rounded shoulders, forward head, and elevated shoulders  LOWER EXTREMITY ROM:    To be assessed   LOWER EXTREMITY MMT:    MMT Right Eval Left Eval  Hip flexion 3 4-  Hip extension 3+ 4  Hip abduction 4- 4-  Hip adduction 4- 4-  Hip internal rotation    Hip external rotation    Knee flexion 2 4-  Knee extension 3 4  Ankle dorsiflexion 2 4-  Ankle plantarflexion    Ankle inversion    Ankle eversion    (Blank rows = not tested)  BED MOBILITY:  Not tested  TRANSFERS: Sit to stand: SBA  Assistive device utilized: Environmental consultant - 2 wheeled     Stand to sit: SBA  Assistive device utilized: Environmental consultant - 2 wheeled     Chair to chair: SBA  Assistive device utilized: Environmental consultant - 2 wheeled       RAMP:  Not tested  CURB:  Not tested  STAIRS: Not tested GAIT: Findings: Gait Characteristics: step to pattern, step through pattern, decreased stride length, Right steppage, Right foot flat, and poor foot clearance- Right, Distance walked: inconsistent step to leading with RLE and then LLE, Assistive device utilized:Walker - 2 wheeled, Level of assistance: CGA, and Comments: inconstitent step to pattern, inconsistent foot drop drag on the RLE. Noted to have midfoot contact in turns, but toe contact   FUNCTIONAL TESTS:  5 times sit to stand: 35.95 Timed up and go (TUG): 1:34 min with RW  10 meter walk test: 2:11 min with RW. 0.26m/s  Berg Balance Scale: 31  PATIENT SURVEYS:  ABC scale 18%                                                                                                                              TREATMENT DATE: 07/19/2023  Gait into therapy clinic using RW with SBA - noticed pt with R ankle instability rolling partially in/out of eversion and pt tending to land on lateral side of R foot. Pt does report the lower portion of her R LE feels "numb" still with decreased sensation.   Donned R LE DF assist ACE wrap.    Gait training ~65ft using RW with CGA and therapist providing light min A to facilitating improved R LE gait mechanics Significant improvement in R LE heel contact and ankle stability during stance wearing ACE wrap  Continued to notice slight R circumduction compensation with decreased R hip/knee flexion during swing Noticed decreased pausing during gait overall today with more continuous forward movement  Gait training additional ~41ft + 186ft + 149ft (seated breaks between) using R HHA progressed to no UE support with light min assist for balance.  Pt continues to demo R LE gait impairments at noted above and increased R forefoot contact after swing compared to when using RW, but this improved on 2nd and 3rd gait trials Noticed more ankle instability without AD, but also this improved as therapist cued for increased heel strike on initial contact with focus on not inverting when activating ankle DF  Donned 4lb AW to R LE on 3rd gait trial with further improvement in her motor plan due to the increased proprioceptive feedback   Discussed ASO benefits since pt is a full time ambulator to decrease her risk of ankle inversion rolling and injury. Educated pt on bringing brace to therapy to trial and will return if not supporting patient as needed.  Pt states the ACE wrap feels "different" than what she is used to. Pt states she can tell  she is landing on her heel more than at home where she lands on her toes more consistently.   Stair navigation training ascending/descending 12 steps (6" height) using B HRs with skilled light min assist for safety - cuing for reciprocal pattern on ascent and step-to progressed to reciprocal pattern on descent - pt with good ability to power up through R LE and advance R LE during ascent  Still wearing 4lb AW on R LE for NMR  Gait training out of therapy clinic using RW with therapist providing cuing to carry over improved R heel strike on initial contact with pt  demoing significant improvement (focus on heel to toe), but does require cuing to recall the need to do it consistently rather than reverting back to toe landing on initial contact which has become such an engrained motor plan.      PATIENT EDUCATION: Education details: POC. HEP. Pt educated throughout session about proper posture and technique with exercises. Improved exercise technique, movement at target joints, use of target muscles after min to mod verbal, visual, tactile cues.  Person educated: Patient Education method: Explanation Education comprehension: verbalized understanding  HOME EXERCISE PROGRAM: Access Code: [email or web address] URL: [email or web address] Date: 06/29/2023 Prepared by: Aurora Lees  Exercises - Sit to Stand with Armchair  - 1 x daily - 4 x weekly - 3 sets - 6 reps - Standing March with Counter Support  - 1 x daily - 4 x weekly - 3 sets - 10 reps - Heel Toe Raises with Counter Support  - 1 x daily - 4 x weekly - 3 sets - 10 reps - Backward Walking with Counter Support  - 1 x daily - 4 x weekly - 3 sets - 5 reps  GOALS: Goals reviewed with patient? Yes   SHORT TERM GOALS: Target date: 07/20/2023    Patient will be independent in home exercise program to improve strength/mobility for better functional independence with ADLs. Baseline: to be given on visit 2.  Goal status: INITIAL   LONG TERM GOALS: Target date: 09/14/2023     Patient will increase ABC score to equal to or greater than   15points   to demonstrate statistically significant improvement in mobility and quality of life.  Baseline: 18 Goal status: INITIAL  2.  Patient (> 12 years old) will complete five times sit to stand test in < 15 seconds indicating an increased LE strength and improved balance. Baseline: 35.95sec Goal status: INITIAL  3.  Patient will increase Berg Balance score by > 6 points to demonstrate decreased fall risk during functional activities Baseline: to be  completed  Goal status: INITIAL  4.  Patient will increase 10 meter walk test to >0.53m/s as to improve gait speed for better community ambulation and to reduce fall risk. Baseline: 0.22m/s Goal status: INITIAL  5.  Patient will reduce timed up and go to <11 seconds to reduce fall risk and demonstrate improved transfer/gait ability. Baseline: 1:34 min with RW Goal status: INITIAL    ASSESSMENT:  CLINICAL IMPRESSION:  Patient is a 53 y.o. Female who was seen today for physical therapy treatment for weakness following recent hospitalization. Pt was reported to have seizure like activity with possible CVA. Therapy session focused on R LE NMR via gait training with emphasis on improved R heel strike on initial contact, improved R ankle stability during stance, and improved continuous forward propulsion of gait. Patient benefited from use of DF assist ACE wrap as well as 4lb AW to protect  her ankle alignment as well as for increased proprioceptive feedback. Therapist educated pt on potential benefits of an ASO and recommended pt purchase one to trial during therapy. At end of session, pt with significant improvement in heel to toe gait pattern with more consistent forward propulsion of gait. Pt will benefit from skilled PT to address focal weakness and abnormalities of gait and improved safety and independence to allow return to PLOF.   OBJECTIVE IMPAIRMENTS: Abnormal gait, decreased activity tolerance, decreased balance, decreased coordination, decreased endurance, decreased knowledge of condition, decreased knowledge of use of DME, decreased mobility, difficulty walking, decreased ROM, decreased strength, impaired sensation, impaired UE functional use, improper body mechanics, and pain.   ACTIVITY LIMITATIONS: lifting, standing, squatting, sleeping, transfers, bed mobility, bathing, reach over head, hygiene/grooming, locomotion level, and caring for others  PARTICIPATION LIMITATIONS: cleaning,  laundry, driving, shopping, community activity, occupation, and yard work  PERSONAL FACTORS: 3+ comorbidities: HTN, seizures, CVA, functional movement disorder are also affecting patient's functional outcome.   REHAB POTENTIAL: Excellent  CLINICAL DECISION MAKING: Evolving/moderate complexity  EVALUATION COMPLEXITY: Moderate  PLAN:  PT FREQUENCY: 1-2x/week  PT DURATION: 12 weeks  PLANNED INTERVENTIONS: 97164- PT Re-evaluation, 97750- Physical Performance Testing, 97110-Therapeutic exercises, 97530- Therapeutic activity, 97112- Neuromuscular re-education, 97535- Self Care, 16109- Manual therapy, [phone]- Gait training, [phone]- Orthotic Initial, [phone]- Orthotic/Prosthetic subsequent, [phone]- Aquatic Therapy, Patient/Family education, Balance training, Stair training, Taping, Dry Needling, Joint mobilization, Joint manipulation, Spinal manipulation, Spinal mobilization, Vestibular training, Visual/preceptual remediation/compensation, DME instructions, Cryotherapy, and Moist heat  PLAN FOR NEXT SESSION:  - Re-assess HEP - further gait training focused on heel contact and reduced stop/start pattern  - continued use of DF assist ACE wrap & 4lb AW  - follow-up on ASO brace - Progress to more dynamic gait with stepping in variable directions when not concerned about ankle inversion rolling    Buddy Loeffelholz, PT, DPT, NCS, CSRS Physical Therapist - [place]  [facility]  5:45 PM 07/19/23

## 2023-07-28 ENCOUNTER — Ambulatory Visit: Payer: MEDICAID | Attending: Family Medicine | Admitting: Physical Therapy

## 2023-07-28 DIAGNOSIS — M6281 Muscle weakness (generalized): Secondary | ICD-10-CM | POA: Insufficient documentation

## 2023-07-28 DIAGNOSIS — R262 Difficulty in walking, not elsewhere classified: Secondary | ICD-10-CM | POA: Insufficient documentation

## 2023-07-28 DIAGNOSIS — R269 Unspecified abnormalities of gait and mobility: Secondary | ICD-10-CM | POA: Diagnosis present

## 2023-07-28 DIAGNOSIS — R278 Other lack of coordination: Secondary | ICD-10-CM | POA: Insufficient documentation

## 2023-07-28 NOTE — Therapy (Signed)
 OUTPATIENT PHYSICAL THERAPY NEURO TREATMENT   Patient Name: Heather Figueroa MRN: [id number] DOB:[birth year], 53 y.o., female Today's Date: 07/28/2023   PCP: Nestor Banter, MD  REFERRING PROVIDER: Nestor Banter, MD   END OF SESSION:   PT End of Session - 07/28/23 1109     Visit Number 4    Number of Visits 24    Date for PT Re-Evaluation 09/14/23    PT Start Time 1108    PT Stop Time 1151    PT Time Calculation (min) 43 min    Equipment Utilized During Treatment Gait belt    Activity Tolerance Patient tolerated treatment well    Behavior During Therapy [facility] for tasks assessed/performed               Past Medical History:  Diagnosis Date   Anemia    Anxiety    Arthritis    Depression    Headache    Hypertension    Neuromuscular disorder (HCC)    Seizures (HCC)    pt has had 2 seizures in July 2018   Stroke [facility])    possible TIA in July 2018   Past Surgical History:  Procedure Laterality Date   ABDOMINAL HYSTERECTOMY     2002   ANTERIOR CERVICAL DECOMP/DISCECTOMY FUSION N/A 01/26/2017   Procedure: ANTERIOR CERVICAL DECOMPRESSION/DISCECTOMY FUSION, INTERBODY PROSTHESIS, PLATE/SCREWS, POSSIBLE CORPECTOMY CERVICAL FOUR- CERVICAL FIVE, CERVICAL FIVE- CERVICAL SIX, CERVICAL SIX- CERVICAL SEVEN;  Surgeon: Garry Kansas, MD;  Location: [facility] OR;  Service: Neurosurgery;  Laterality: N/A;  ANTERIOR CERVICAL DECOMPRESSION/DISCECTOMY FUSION, INTERBODY PROSTHESIS, PLATE/SCREWS, POSSIBLE CORPE   APPENDECTOMY     2010   COLONOSCOPY N/A 04/05/2022   Procedure: COLONOSCOPY;  Surgeon: Quintin Buckle, DO;  Location: [facility] ENDOSCOPY;  Service: Gastroenterology;  Laterality: N/A;   ESOPHAGOGASTRODUODENOSCOPY N/A 04/05/2022   Procedure: ESOPHAGOGASTRODUODENOSCOPY (EGD);  Surgeon: Quintin Buckle, DO;  Location: [facility] ENDOSCOPY;  Service: Gastroenterology;  Laterality: N/A;   OVARIAN CYST REMOVAL     TUBAL LIGATION     1998   Patient Active Problem List   Diagnosis  Date Noted   Prediabetes 05/24/2023   Essential hypertension 05/24/2023   Anxiety and depression 05/24/2023   Seizure (HCC) 05/23/2023   ASD (atrial septal defect): Possible 06/20/2021   Tobacco abuse 06/20/2021   Hypokalemia 06/19/2021   Chest pain 06/18/2021   Seizure disorder (HCC) 06/18/2021   Well woman exam with routine gynecological exam 01/16/2019   Cervical spondylosis with myelopathy and radiculopathy 01/26/2017    ONSET DATE: 05/23/2023  REFERRING DIAG:  Diagnosis  R53.1 (ICD-10-CM) - Weakness    THERAPY DIAG:  Difficulty in walking, not elsewhere classified  Other lack of coordination  Muscle weakness (generalized)  Abnormality of gait  Rationale for Evaluation and Treatment: Rehabilitation  SUBJECTIVE:  SUBJECTIVE STATEMENT:  Pt reports she ordered the ASO, but they sent the wrong size so she is having to return it and trade it out for correct size. Pt reports she has been doing OK. Pt continues to have pain on lateral side of R ankle/lower leg.   Reports in the past couple of days she has had a "tingling" sensation in her R foot. Reports some nights she can't sleep because her feet (specifically the top of her foot) itch so incredibly bad. Pt reports she often has to pull the covers off of her foot and have the fan circulating air to help manage the sensations. Pt states she has a follow-up with her PCP, but can't remember the date or timeframe. Therapist encouraged pt to reach out to neurologist to address the tingling and itching in her feet at night.   Reports she stumbled this past Saturday, but was able to catch herself before falling. Pt continues to report she is not using RW in the home, mostly using furniture/wall for support while walking.   Reports pain rated as 6/10  described as "just a little" on the lateral side of R ankle/lower leg to start session.  Pt reports she is trying to be intentional to walk heel-to-toe, but she sometimes finds herself walking on her toes.  Pt reports doing HEP at home and states it is going "so, so."    Initial Eval: Pt reports that she had Seizure and CVA  roughly 3-4 weeks ago followed by hospital admission. Reports that R side, UE and LE were both affected. States that she now has difficulty with gait, bathing, transfers, and dressing tasks due to increased R side weakness. Due to lack of payor difficulties, she Reports that she received no PT or OT  following recent hospitalizaiton. Neck surgery in 2018, following which she had to "re-learn how to walk" Reports that she started having weakness in 2017 "after they brought me back to life".   Pt accompanied by: self  PERTINENT HISTORY:    From recent hospitalization:  "53 y.o. female presented to the ED via EMS from work for evaluation of seizure and chest pain. Patient reportedly had a seizure while at work ; on EMS arrival, patient was postictal. She had difficulty moving her lower extremities and difficulty speaking; EEG ordered; with medical history significant of hypertension, history of functional neurologic disorder with gait impairment, seizures, CVA/TIA, anxiety/depression, chronic constipation, GERD, history of PFO, prediabetes, tobacco abuse "  Hx of Functional movement disorder; CVA/TIA. ACDF in 2018; sent home with HHPT at that time.  PAIN:  Are you having pain? Yes: NPRS scale: 8.5/10  Pain location: R arm  Pain description: stabbing. Hurt.  Aggravating factors: sleeping on R side and lifting  Relieving factors: heat.   PRECAUTIONS: Fall  RED FLAGS: Cervical red flags: hx of cervical fusion   WEIGHT BEARING RESTRICTIONS: limited by strength, no official restriction from Cspine surgery 2018  FALLS: Has patient fallen in last 6 months? No  LIVING  ENVIRONMENT: Lives with: lives with their son Lives in: House/apartment Stairs: Yes: External: 2 steps; none deep steps that walker can fit  Has following equipment at home: Walker - 2 wheeled  PLOF: Independent with basic ADLs and Independent with household mobility without device  PATIENT GOALS: Get stronger. Walk without walker  OBJECTIVE:  Note: Objective measures were completed at Evaluation unless otherwise noted.  DIAGNOSTIC FINDINGS:   IMPRESSION: 1. No acute intracranial abnormality. 2. No seizure focus identified.  3. Findings of chronic small vessel ischemia.  COGNITION: Overall cognitive status: Within functional limits for tasks assessed   SENSATION: Light touch: Impaired   COORDINATION: Finger to nose: mild ataxia and decreased ROM due to strength/pain  Ankle knee:   EDEMA:  WFL  MUSCLE TONE: inconsistent   DTRs:   POSTURE: rounded shoulders, forward head, and elevated shoulders  LOWER EXTREMITY ROM:    To be assessed   LOWER EXTREMITY MMT:    MMT Right Eval Left Eval  Hip flexion 3 4-  Hip extension 3+ 4  Hip abduction 4- 4-  Hip adduction 4- 4-  Hip internal rotation    Hip external rotation    Knee flexion 2 4-  Knee extension 3 4  Ankle dorsiflexion 2 4-  Ankle plantarflexion    Ankle inversion    Ankle eversion    (Blank rows = not tested)  BED MOBILITY:  Not tested  TRANSFERS: Sit to stand: SBA  Assistive device utilized: Environmental consultant - 2 wheeled     Stand to sit: SBA  Assistive device utilized: Environmental consultant - 2 wheeled     Chair to chair: SBA  Assistive device utilized: Environmental consultant - 2 wheeled       RAMP:  Not tested  CURB:  Not tested  STAIRS: Not tested GAIT: Findings: Gait Characteristics: step to pattern, step through pattern, decreased stride length, Right steppage, Right foot flat, and poor foot clearance- Right, Distance walked: inconsistent step to leading with RLE and then LLE, Assistive device utilized:Walker - 2 wheeled,  Level of assistance: CGA, and Comments: inconstitent step to pattern, inconsistent foot drop drag on the RLE. Noted to have midfoot contact in turns, but toe contact   FUNCTIONAL TESTS:  5 times sit to stand: 35.95 Timed up and go (TUG): 1:34 min with RW  10 meter walk test: 2:11 min with RW. 0.74m/s  Berg Balance Scale: 31  PATIENT SURVEYS:  ABC scale 18%                                                                                                                              TREATMENT DATE: 07/28/2023  Gait into therapy clinic using RW with SBA - noticed pt with continued R ankle instability rolling partially in/out of eversion and pt tending to land on lateral side of R foot; however, improving R heel landing at initial contact.   Discussed the "tingling" in R foot may be where pt's sensation is changing from numbness to increased sensory feedback.   Gait training 369ft, on AD, targeting R LE NMR for improved motor planning/coordination and gait mechanics:  Skilled min A for balance and facilitating weight shifting for improved fluidity and forward movement of gait Cuing for improved R heel strike on initial contact with focus on lifting R 5th toe up to avoid risk of inversion rolling Continues to have significantly decreased gait speed; however, is able to improve slightly when cued and  facilitated Continues to have minor R lateral ankle instability during stance with slight lateral movements - likely associated with proprioceptive/sensory deficits   Reviewed HEP as follows:  Sit to stands with hands on knees  x10 reps From green chair with airex pad placed in it to raise seat height Cuing for increased anterior trunk lean when coming to stand Cuing to stick hips backwards and bringing chest down when going to sit Updated pt's HEP accordingly Standing marches with B UE support  Pt performs with excellent form, kept the same on HEP No concern for ankle rolling observed Standing  at counter heel and toe raises Cuing to power straight up like a "rocket ship" when raising heels up Pt demos good control with no risk of ankle rolling even when cued for increase heel clearance Using B UE support X10 reps Forward/backwards walking at counter with R UE support Therapist cuing to take at least partial reciprocal stepping pattern in both directions Cuing for heel to toe walking during forward gait and then the reverse of toe-to-heel when walking backwards These cues provided on pt's HEP printout Down/back x5 reps at balance bar   Updated HEP printout provided  Gait training out of therapy clinic using RW with therapist providing cuing to carry over improved R heel strike on initial contact with pt demoing significant improvement (focus on heel to toe), but does require cuing to recall the need to do it consistently rather than reverting back to toe landing on initial contact, which has become such an engrained motor plan.      PATIENT EDUCATION: Education details: POC. HEP. Pt educated throughout session about proper posture and technique with exercises. Improved exercise technique, movement at target joints, use of target muscles after min to mod verbal, visual, tactile cues.  Person educated: Patient Education method: Explanation Education comprehension: verbalized understanding  HOME EXERCISE PROGRAM:    Access Code: [email or web address] URL: [email or web address] Date: 07/28/2023 Prepared by: Carlen Chasten  Exercises - Sit to Stand with Hands on Knees  - 1 x daily - 7 x weekly - 2 sets - 10 reps - Standing March with Counter Support  - 1 x daily - 4 x weekly - 3 sets - 10 reps - Heel Toe Raises with Counter Support  - 1 x daily - 4 x weekly - 3 sets - 10 reps - Backward Walking with Counter Support  - 1 x daily - 4 x weekly - 3 sets - 5 reps    GOALS: Goals reviewed with patient? Yes   SHORT TERM GOALS: Target date: 07/20/2023    Patient will be  independent in home exercise program to improve strength/mobility for better functional independence with ADLs. Baseline: initiated on 06/29/2023  07/28/2023: updated Goal status: INITIAL   LONG TERM GOALS: Target date: 09/14/2023     Patient will increase ABC score to equal to or greater than   15points   to demonstrate statistically significant improvement in mobility and quality of life.  Baseline: 18 Goal status: INITIAL  2.  Patient (> 52 years old) will complete five times sit to stand test in < 15 seconds indicating an increased LE strength and improved balance. Baseline: 35.95sec Goal status: INITIAL  3.  Patient will increase Berg Balance score by > 6 points to demonstrate decreased fall risk during functional activities Baseline: to be completed  Goal status: INITIAL  4.  Patient will increase 10 meter walk test to >0.29m/s as to improve gait speed for better community  ambulation and to reduce fall risk. Baseline: 0.44m/s Goal status: INITIAL  5.  Patient will reduce timed up and go to <11 seconds to reduce fall risk and demonstrate improved transfer/gait ability. Baseline: 1:34 min with RW Goal status: INITIAL    ASSESSMENT:  CLINICAL IMPRESSION:  Patient is a 53 y.o. Female who was seen today for physical therapy treatment for weakness following recent hospitalization. Pt was reported to have seizure like activity with possible CVA. Therapy session focused on R LE NMR via gait training with emphasis on improved R heel strike on initial contact, improved R ankle stability during stance, and improved continuous forward propulsion of gait. Patient planning to order correct size ASO to help with lateral ankle instability during gait as it is still present. Patient progressed to performing 365ft of gait training, no AD, with skilled min A and improving consistency of R heel strike at initial contact and slight improvement in gait speed, but able to maintain continuous forward  movement of gait (less pauses during each stance phase). Therapist continues to cue patient to carryover improved gait mechanics when ambulating out of clinic using RW. Pt will benefit from skilled PT to address focal weakness and abnormalities of gait and improved safety and independence to allow return to PLOF.   OBJECTIVE IMPAIRMENTS: Abnormal gait, decreased activity tolerance, decreased balance, decreased coordination, decreased endurance, decreased knowledge of condition, decreased knowledge of use of DME, decreased mobility, difficulty walking, decreased ROM, decreased strength, impaired sensation, impaired UE functional use, improper body mechanics, and pain.   ACTIVITY LIMITATIONS: lifting, standing, squatting, sleeping, transfers, bed mobility, bathing, reach over head, hygiene/grooming, locomotion level, and caring for others  PARTICIPATION LIMITATIONS: cleaning, laundry, driving, shopping, community activity, occupation, and yard work  PERSONAL FACTORS: 3+ comorbidities: HTN, seizures, CVA, functional movement disorder are also affecting patient's functional outcome.   REHAB POTENTIAL: Excellent  CLINICAL DECISION MAKING: Evolving/moderate complexity  EVALUATION COMPLEXITY: Moderate  PLAN:  PT FREQUENCY: 1-2x/week  PT DURATION: 12 weeks  PLANNED INTERVENTIONS: 97164- PT Re-evaluation, 97750- Physical Performance Testing, 97110-Therapeutic exercises, 97530- Therapeutic activity, 97112- Neuromuscular re-education, 97535- Self Care, 16109- Manual therapy, [phone]- Gait training, [phone]- Orthotic Initial, [phone]- Orthotic/Prosthetic subsequent, [phone]- Aquatic Therapy, Patient/Family education, Balance training, Stair training, Taping, Dry Needling, Joint mobilization, Joint manipulation, Spinal manipulation, Spinal mobilization, Vestibular training, Visual/preceptual remediation/compensation, DME instructions, Cryotherapy, and Moist heat  PLAN FOR NEXT SESSION:  - further gait training  focused on heel contact and reduced stop/start pattern  - continued use of DF assist ACE wrap & 4lb AW as appropriate  - follow-up on ASO brace - Progress to more dynamic gait with stepping in variable directions when not concerned about ankle inversion rolling    Eziah Negro, PT, DPT, NCS, CSRS Physical Therapist - [place]  [facility]  11:52 AM 07/28/23

## 2023-08-04 ENCOUNTER — Ambulatory Visit: Payer: MEDICAID | Admitting: Physical Therapy

## 2023-08-04 NOTE — Therapy (Incomplete)
 OUTPATIENT PHYSICAL THERAPY NEURO TREATMENT   Patient Name: Heather Figueroa MRN: [id number] DOB:07-Jul-1970, 53 y.o., female Today's Date: 08/04/2023   PCP: Nestor Banter, MD  REFERRING PROVIDER: Nestor Banter, MD   END OF SESSION: ***      Past Medical History:  Diagnosis Date   Anemia    Anxiety    Arthritis    Depression    Headache    Hypertension    Neuromuscular disorder (HCC)    Seizures (HCC)    pt has had 2 seizures in July 2018   Stroke [facility])    possible TIA in July 2018   Past Surgical History:  Procedure Laterality Date   ABDOMINAL HYSTERECTOMY     2002   ANTERIOR CERVICAL DECOMP/DISCECTOMY FUSION N/A 01/26/2017   Procedure: ANTERIOR CERVICAL DECOMPRESSION/DISCECTOMY FUSION, INTERBODY PROSTHESIS, PLATE/SCREWS, POSSIBLE CORPECTOMY CERVICAL FOUR- CERVICAL FIVE, CERVICAL FIVE- CERVICAL SIX, CERVICAL SIX- CERVICAL SEVEN;  Surgeon: Garry Kansas, MD;  Location: [facility] OR;  Service: Neurosurgery;  Laterality: N/A;  ANTERIOR CERVICAL DECOMPRESSION/DISCECTOMY FUSION, INTERBODY PROSTHESIS, PLATE/SCREWS, POSSIBLE CORPE   APPENDECTOMY     2010   COLONOSCOPY N/A 04/05/2022   Procedure: COLONOSCOPY;  Surgeon: Quintin Buckle, DO;  Location: [facility] ENDOSCOPY;  Service: Gastroenterology;  Laterality: N/A;   ESOPHAGOGASTRODUODENOSCOPY N/A 04/05/2022   Procedure: ESOPHAGOGASTRODUODENOSCOPY (EGD);  Surgeon: Quintin Buckle, DO;  Location: [facility] ENDOSCOPY;  Service: Gastroenterology;  Laterality: N/A;   OVARIAN CYST REMOVAL     TUBAL LIGATION     1998   Patient Active Problem List   Diagnosis Date Noted   Prediabetes 05/24/2023   Essential hypertension 05/24/2023   Anxiety and depression 05/24/2023   Seizure (HCC) 05/23/2023   ASD (atrial septal defect): Possible 06/20/2021   Tobacco abuse 06/20/2021   Hypokalemia 06/19/2021   Chest pain 06/18/2021   Seizure disorder (HCC) 06/18/2021   Well woman exam with routine gynecological exam 01/16/2019   Cervical  spondylosis with myelopathy and radiculopathy 01/26/2017    ONSET DATE: 05/23/2023  REFERRING DIAG:  Diagnosis  R53.1 (ICD-10-CM) - Weakness    THERAPY DIAG: *** No diagnosis found.  Rationale for Evaluation and Treatment: Rehabilitation  SUBJECTIVE:                                                                                                                                                                                             SUBJECTIVE STATEMENT:  ***  Pt reports she ordered the ASO, but they sent the wrong size so she is having to return it and trade it out for correct size. Pt reports she has been doing OK. Pt continues to have  pain on lateral side of R ankle/lower leg.   Reports in the past couple of days she has had a tingling sensation in her R foot. Reports some nights she can't sleep because her feet (specifically the top of her foot) itch so incredibly bad. Pt reports she often has to pull the covers off of her foot and have the fan circulating air to help manage the sensations. Pt states she has a follow-up with her PCP, but can't remember the date or timeframe. Therapist encouraged pt to reach out to neurologist to address the tingling and itching in her feet at night.   Reports she stumbled this past Saturday, but was able to catch herself before falling. Pt continues to report she is not using RW in the home, mostly using furniture/wall for support while walking.   Reports pain rated as 6/10 described as just a little on the lateral side of R ankle/lower leg to start session.  Pt reports she is trying to be intentional to walk heel-to-toe, but she sometimes finds herself walking on her toes.  Pt reports doing HEP at home and states it is going so, so.   ***  Initial Eval: Pt reports that she had Seizure and CVA  roughly 3-4 weeks ago followed by hospital admission. Reports that R side, UE and LE were both affected. States that she now has difficulty with  gait, bathing, transfers, and dressing tasks due to increased R side weakness. Due to lack of payor difficulties, she Reports that she received no PT or OT  following recent hospitalizaiton. Neck surgery in 2018, following which she had to re-learn how to walk Reports that she started having weakness in 2017 after they brought me back to life.   Pt accompanied by: self  PERTINENT HISTORY:    From recent hospitalization:  53 y.o. female presented to the ED via EMS from work for evaluation of seizure and chest pain. Patient reportedly had a seizure while at work ; on EMS arrival, patient was postictal. She had difficulty moving her lower extremities and difficulty speaking; EEG ordered; with medical history significant of hypertension, history of functional neurologic disorder with gait impairment, seizures, CVA/TIA, anxiety/depression, chronic constipation, GERD, history of PFO, prediabetes, tobacco abuse   Hx of Functional movement disorder; CVA/TIA. ACDF in 2018; sent home with HHPT at that time.  PAIN:  Are you having pain? Yes: NPRS scale: 8.5/10  Pain location: R arm  Pain description: stabbing. Hurt.  Aggravating factors: sleeping on R side and lifting  Relieving factors: heat.   PRECAUTIONS: Fall  RED FLAGS: Cervical red flags: hx of cervical fusion   WEIGHT BEARING RESTRICTIONS: limited by strength, no official restriction from Cspine surgery 2018  FALLS: Has patient fallen in last 6 months? No  LIVING ENVIRONMENT: Lives with: lives with their son Lives in: House/apartment Stairs: Yes: External: 2 steps; none deep steps that walker can fit  Has following equipment at home: Walker - 2 wheeled  PLOF: Independent with basic ADLs and Independent with household mobility without device  PATIENT GOALS: Get stronger. Walk without walker  OBJECTIVE:  Note: Objective measures were completed at Evaluation unless otherwise noted.  DIAGNOSTIC FINDINGS:   IMPRESSION: 1. No  acute intracranial abnormality. 2. No seizure focus identified. 3. Findings of chronic small vessel ischemia.  COGNITION: Overall cognitive status: Within functional limits for tasks assessed   SENSATION: Light touch: Impaired   COORDINATION: Finger to nose: mild ataxia and decreased ROM due to strength/pain  Ankle knee:   EDEMA:  WFL  MUSCLE TONE: inconsistent   DTRs:   POSTURE: rounded shoulders, forward head, and elevated shoulders  LOWER EXTREMITY ROM:    To be assessed   LOWER EXTREMITY MMT:    MMT Right Eval Left Eval  Hip flexion 3 4-  Hip extension 3+ 4  Hip abduction 4- 4-  Hip adduction 4- 4-  Hip internal rotation    Hip external rotation    Knee flexion 2 4-  Knee extension 3 4  Ankle dorsiflexion 2 4-  Ankle plantarflexion    Ankle inversion    Ankle eversion    (Blank rows = not tested)  BED MOBILITY:  Not tested  TRANSFERS: Sit to stand: SBA  Assistive device utilized: Environmental consultant - 2 wheeled     Stand to sit: SBA  Assistive device utilized: Environmental consultant - 2 wheeled     Chair to chair: SBA  Assistive device utilized: Environmental consultant - 2 wheeled       RAMP:  Not tested  CURB:  Not tested  STAIRS: Not tested GAIT: Findings: Gait Characteristics: step to pattern, step through pattern, decreased stride length, Right steppage, Right foot flat, and poor foot clearance- Right, Distance walked: inconsistent step to leading with RLE and then LLE, Assistive device utilized:Walker - 2 wheeled, Level of assistance: CGA, and Comments: inconstitent step to pattern, inconsistent foot drop drag on the RLE. Noted to have midfoot contact in turns, but toe contact   FUNCTIONAL TESTS:  5 times sit to stand: 35.95 Timed up and go (TUG): 1:34 min with RW  10 meter walk test: 2:11 min with RW. 0.28m/s  Berg Balance Scale: 31  PATIENT SURVEYS:  ABC scale 18%                                                                                                                               TREATMENT DATE: 08/04/2023   ***  Gait into therapy clinic using RW with SBA - noticed pt with continued R ankle instability rolling partially in/out of eversion and pt tending to land on lateral side of R foot; however, improving R heel landing at initial contact.   Discussed the tingling in R foot may be where pt's sensation is changing from numbness to increased sensory feedback.   Gait training 355ft, on AD, targeting R LE NMR for improved motor planning/coordination and gait mechanics:  Skilled min A for balance and facilitating weight shifting for improved fluidity and forward movement of gait Cuing for improved R heel strike on initial contact with focus on lifting R 5th toe up to avoid risk of inversion rolling Continues to have significantly decreased gait speed; however, is able to improve slightly when cued and facilitated Continues to have minor R lateral ankle instability during stance with slight lateral movements - likely associated with proprioceptive/sensory deficits   Reviewed HEP as follows:  Sit to stands with hands on knees  x10 reps From green chair with airex pad placed in it to raise seat height Cuing for increased anterior trunk lean when coming to stand Cuing to stick hips backwards and bringing chest down when going to sit Updated pt's HEP accordingly Standing marches with B UE support  Pt performs with excellent form, kept the same on HEP No concern for ankle rolling observed Standing at counter heel and toe raises Cuing to power straight up like a rocket ship when raising heels up Pt demos good control with no risk of ankle rolling even when cued for increase heel clearance Using B UE support X10 reps Forward/backwards walking at counter with R UE support Therapist cuing to take at least partial reciprocal stepping pattern in both directions Cuing for heel to toe walking during forward gait and then the reverse of toe-to-heel when walking  backwards These cues provided on pt's HEP printout Down/back x5 reps at balance bar   Updated HEP printout provided  Gait training out of therapy clinic using RW with therapist providing cuing to carry over improved R heel strike on initial contact with pt demoing significant improvement (focus on heel to toe), but does require cuing to recall the need to do it consistently rather than reverting back to toe landing on initial contact, which has become such an engrained motor plan.     ***   PATIENT EDUCATION: Education details: POC. HEP. Pt educated throughout session about proper posture and technique with exercises. Improved exercise technique, movement at target joints, use of target muscles after min to mod verbal, visual, tactile cues.  Person educated: Patient Education method: Explanation Education comprehension: verbalized understanding  HOME EXERCISE PROGRAM:    Access Code: [email or web address] URL: [email or web address] Date: 07/28/2023 Prepared by: Carlen Chasten  Exercises - Sit to Stand with Hands on Knees  - 1 x daily - 7 x weekly - 2 sets - 10 reps - Standing March with Counter Support  - 1 x daily - 4 x weekly - 3 sets - 10 reps - Heel Toe Raises with Counter Support  - 1 x daily - 4 x weekly - 3 sets - 10 reps - Backward Walking with Counter Support  - 1 x daily - 4 x weekly - 3 sets - 5 reps    GOALS: Goals reviewed with patient? Yes   SHORT TERM GOALS: Target date: 07/20/2023    Patient will be independent in home exercise program to improve strength/mobility for better functional independence with ADLs. Baseline: initiated on 06/29/2023  07/28/2023: updated Goal status: INITIAL   LONG TERM GOALS: Target date: 09/14/2023     Patient will increase ABC score to equal to or greater than   15points   to demonstrate statistically significant improvement in mobility and quality of life.  Baseline: 18 Goal status: INITIAL  2.  Patient (> 70 years old)  will complete five times sit to stand test in < 15 seconds indicating an increased LE strength and improved balance. Baseline: 35.95sec Goal status: INITIAL  3.  Patient will increase Berg Balance score by > 6 points to demonstrate decreased fall risk during functional activities Baseline: to be completed  Goal status: INITIAL  4.  Patient will increase 10 meter walk test to >0.63m/s as to improve gait speed for better community ambulation and to reduce fall risk. Baseline: 0.42m/s Goal status: INITIAL  5.  Patient will reduce timed up and go to <11 seconds to reduce fall risk and demonstrate improved transfer/gait ability. Baseline:  1:34 min with RW Goal status: INITIAL    ASSESSMENT:  CLINICAL IMPRESSION: *** Patient is a 53 y.o. Female who was seen today for physical therapy treatment for weakness following recent hospitalization. Pt was reported to have seizure like activity with possible CVA. Therapy session focused on R LE NMR via gait training with emphasis on improved R heel strike on initial contact, improved R ankle stability during stance, and improved continuous forward propulsion of gait. Patient planning to order correct size ASO to help with lateral ankle instability during gait as it is still present. Patient progressed to performing 358ft of gait training, no AD, with skilled min A and improving consistency of R heel strike at initial contact and slight improvement in gait speed, but able to maintain continuous forward movement of gait (less pauses during each stance phase). Therapist continues to cue patient to carryover improved gait mechanics when ambulating out of clinic using RW. Pt will benefit from skilled PT to address focal weakness and abnormalities of gait and improved safety and independence to allow return to PLOF.   OBJECTIVE IMPAIRMENTS: Abnormal gait, decreased activity tolerance, decreased balance, decreased coordination, decreased endurance, decreased  knowledge of condition, decreased knowledge of use of DME, decreased mobility, difficulty walking, decreased ROM, decreased strength, impaired sensation, impaired UE functional use, improper body mechanics, and pain.   ACTIVITY LIMITATIONS: lifting, standing, squatting, sleeping, transfers, bed mobility, bathing, reach over head, hygiene/grooming, locomotion level, and caring for others  PARTICIPATION LIMITATIONS: cleaning, laundry, driving, shopping, community activity, occupation, and yard work  PERSONAL FACTORS: 3+ comorbidities: HTN, seizures, CVA, functional movement disorder are also affecting patient's functional outcome.   REHAB POTENTIAL: Excellent  CLINICAL DECISION MAKING: Evolving/moderate complexity  EVALUATION COMPLEXITY: Moderate  PLAN:  PT FREQUENCY: 1-2x/week  PT DURATION: 12 weeks  PLANNED INTERVENTIONS: 97164- PT Re-evaluation, 97750- Physical Performance Testing, 97110-Therapeutic exercises, 97530- Therapeutic activity, 97112- Neuromuscular re-education, 97535- Self Care, 16109- Manual therapy, [phone]- Gait training, [phone]- Orthotic Initial, [phone]- Orthotic/Prosthetic subsequent, [phone]- Aquatic Therapy, Patient/Family education, Balance training, Stair training, Taping, Dry Needling, Joint mobilization, Joint manipulation, Spinal manipulation, Spinal mobilization, Vestibular training, Visual/preceptual remediation/compensation, DME instructions, Cryotherapy, and Moist heat  PLAN FOR NEXT SESSION: *** - further gait training focused on heel contact and reduced stop/start pattern  - continued use of DF assist ACE wrap & 4lb AW as appropriate  - follow-up on ASO brace - Progress to more dynamic gait with stepping in variable directions when not concerned about ankle inversion rolling    Shemicka Cohrs, PT, DPT, NCS, CSRS Physical Therapist - [place]  [facility]  8:01 AM 08/04/23

## 2023-08-11 ENCOUNTER — Ambulatory Visit: Payer: MEDICAID

## 2023-08-11 DIAGNOSIS — M6281 Muscle weakness (generalized): Secondary | ICD-10-CM

## 2023-08-11 DIAGNOSIS — R262 Difficulty in walking, not elsewhere classified: Secondary | ICD-10-CM | POA: Diagnosis not present

## 2023-08-11 DIAGNOSIS — R269 Unspecified abnormalities of gait and mobility: Secondary | ICD-10-CM

## 2023-08-11 DIAGNOSIS — R278 Other lack of coordination: Secondary | ICD-10-CM

## 2023-08-11 NOTE — Therapy (Signed)
 OUTPATIENT PHYSICAL THERAPY TREATMENT   Patient Name: Heather Figueroa MRN: [id number] DOB:June 10, 1970, 53 y.o., female Today's Date: 08/11/2023  PCP: Nestor Banter, MD REFERRING PROVIDER: Nestor Banter, MD  END OF SESSION:   PT End of Session - 08/11/23 1055     Visit Number 5    Number of Visits 24    Date for PT Re-Evaluation 09/14/23    Authorization Type [employer]    Progress Note Due on Visit 10    PT Start Time 1055    PT Stop Time 1135    PT Time Calculation (min) 40 min    Equipment Utilized During Treatment Gait belt    Activity Tolerance Patient tolerated treatment well;No increased pain;Patient limited by fatigue    Behavior During Therapy [facility] for tasks assessed/performed          Past Medical History:  Diagnosis Date   Anemia    Anxiety    Arthritis    Depression    Headache    Hypertension    Neuromuscular disorder (HCC)    Seizures (HCC)    pt has had 2 seizures in July 2018   Stroke [facility])    possible TIA in July 2018   Past Surgical History:  Procedure Laterality Date   ABDOMINAL HYSTERECTOMY     2002   ANTERIOR CERVICAL DECOMP/DISCECTOMY FUSION N/A 01/26/2017   Procedure: ANTERIOR CERVICAL DECOMPRESSION/DISCECTOMY FUSION, INTERBODY PROSTHESIS, PLATE/SCREWS, POSSIBLE CORPECTOMY CERVICAL FOUR- CERVICAL FIVE, CERVICAL FIVE- CERVICAL SIX, CERVICAL SIX- CERVICAL SEVEN;  Surgeon: Garry Kansas, MD;  Location: [facility] OR;  Service: Neurosurgery;  Laterality: N/A;  ANTERIOR CERVICAL DECOMPRESSION/DISCECTOMY FUSION, INTERBODY PROSTHESIS, PLATE/SCREWS, POSSIBLE CORPE   APPENDECTOMY     2010   COLONOSCOPY N/A 04/05/2022   Procedure: COLONOSCOPY;  Surgeon: Quintin Buckle, DO;  Location: [facility] ENDOSCOPY;  Service: Gastroenterology;  Laterality: N/A;   ESOPHAGOGASTRODUODENOSCOPY N/A 04/05/2022   Procedure: ESOPHAGOGASTRODUODENOSCOPY (EGD);  Surgeon: Quintin Buckle, DO;  Location: [facility] ENDOSCOPY;  Service: Gastroenterology;   Laterality: N/A;   OVARIAN CYST REMOVAL     TUBAL LIGATION     1998   Patient Active Problem List   Diagnosis Date Noted   Prediabetes 05/24/2023   Essential hypertension 05/24/2023   Anxiety and depression 05/24/2023   Seizure (HCC) 05/23/2023   ASD (atrial septal defect): Possible 06/20/2021   Tobacco abuse 06/20/2021   Hypokalemia 06/19/2021   Chest pain 06/18/2021   Seizure disorder (HCC) 06/18/2021   Well woman exam with routine gynecological exam 01/16/2019   Cervical spondylosis with myelopathy and radiculopathy 01/26/2017   ONSET DATE: 05/23/2023  REFERRING DIAG:  Diagnosis  R53.1 (ICD-10-CM) - Weakness    THERAPY DIAG:  Difficulty in walking, not elsewhere classified  Other lack of coordination  Muscle weakness (generalized)  Abnormality of gait  Rationale for Evaluation and Treatment: Rehabilitation  SUBJECTIVE:  SUBJECTIVE STATEMENT: Pt denies any medical updates or falls since last session, has been busy due to 2 deaths in the family.   PERTINENT HISTORY:  Pt reports that she had Seizure and CVA  roughly 3-4 weeks ago followed by hospital admission. Reports that R side, UE and LE were both affected. States that she now has difficulty with gait, bathing, transfers, and dressing tasks due to increased R side weakness. Due to lack of payor difficulties, she Reports that she received no PT or OT  following recent hospitalizaiton. Neck surgery in 2018, following which she had to re-learn how to walk Reports that she started having weakness in 2017 after they brought me back to life.   PAIN: None today     PRECAUTIONS: Fall  RED FLAGS: None  WEIGHT BEARING RESTRICTIONS: limited by strength, no official restriction from Cspine surgery 2018  FALLS: Has patient fallen in  last 6 months? No  LIVING ENVIRONMENT: Lives with: lives with their son Lives in: House/apartment Stairs: Yes: External: 2 steps; none deep steps that walker can fit  Has following equipment at home: Walker - 2 wheeled  PLOF: Independent with basic ADLs and Independent with household mobility without device  PATIENT GOALS: Get stronger. Walk without walker.   OBJECTIVE:  Note: Objective measures were completed at Evaluation unless otherwise noted.  DIAGNOSTIC FINDINGS:   IMPRESSION: 1. No acute intracranial abnormality. 2. No seizure focus identified. 3. Findings of chronic small vessel ischemia.  COGNITION: Overall cognitive status: Within functional limits for tasks assessed   SENSATION: Light touch: Impaired   COORDINATION: Finger to nose: mild ataxia and decreased ROM due to strength/pain  Ankle knee:   POSTURE: rounded shoulders, forward head, and elevated shoulders  LOWER EXTREMITY ROM:    To be assessed   LOWER EXTREMITY MMT:    MMT Right Eval Left Eval  Hip flexion 3 4-  Hip extension 3+ 4  Hip abduction 4- 4-  Hip adduction 4- 4-  Hip internal rotation    Hip external rotation    Knee flexion 2 4-  Knee extension 3 4  Ankle dorsiflexion 2 4-  Ankle plantarflexion    Ankle inversion    Ankle eversion    (Blank rows = not tested)  BED MOBILITY:  Not tested  TRANSFERS: Sit to stand: SBA  Assistive device utilized: Environmental consultant - 2 wheeled     Stand to sit: SBA  Assistive device utilized: Environmental consultant - 2 wheeled     Chair to chair: SBA  Assistive device utilized: Environmental consultant - 2 wheeled       GAIT: Findings: Gait Characteristics: step to pattern, step through pattern, decreased stride length, Right steppage, Right foot flat, and poor foot clearance- Right, Distance walked: inconsistent step to leading with RLE and then LLE, Assistive device utilized:Walker - 2 wheeled, Level of assistance: CGA, and Comments: inconstitent step to pattern, inconsistent foot drop  drag on the RLE. Noted to have midfoot contact in turns, but toe contact   FUNCTIONAL TESTS:  5 times sit to stand: 35.95 Timed up and go (TUG): 1:34 min with RW  10 meter walk test: 2:11 min with RW. 0.36m/s  Berg Balance Scale: 31  PATIENT SURVEYS:  ABC scale 18%  TREATMENT DATE: 08/11/2023 -4 straining stairs c 4lb AW Right: 3x up, 3x down: pt elects to go up with bad and down with good -10x STS from elevated surface height 1x10 (cues for hands on knees)  -662ft AMB with 45lb AW Right -high intensity interevals with sled push 3x38ft with 1 minute srest between  *guest gair with 60lbs loaded, 4lb RLE AW and yellow TB assist of ankle.   PATIENT EDUCATION: Education details: POC. HEP. Pt educated throughout session about proper posture and technique with exercises. Improved exercise technique, movement at target joints, use of target muscles after min to mod verbal, visual, tactile cues.  Person educated: Patient Education method: Explanation Education comprehension: verbalized understanding  HOME EXERCISE PROGRAM:   Access Code: [email or web address] URL: [email or web address] Date: 07/28/2023 Prepared by: Carlen Chasten  Exercises - Sit to Stand with Hands on Knees  - 1 x daily - 7 x weekly - 2 sets - 10 reps - Standing March with Counter Support  - 1 x daily - 4 x weekly - 3 sets - 10 reps - Heel Toe Raises with Counter Support  - 1 x daily - 4 x weekly - 3 sets - 10 reps - Backward Walking with Counter Support  - 1 x daily - 4 x weekly - 3 sets - 5 reps    GOALS: Goals reviewed with patient? Yes   SHORT TERM GOALS: Target date: 07/20/2023  Patient will be independent in home exercise program to improve strength/mobility for better functional independence with ADLs. Baseline: initiated on 06/29/2023  07/28/2023: updated Goal status: INITIAL   LONG  TERM GOALS: Target date: 09/14/2023  Patient will increase ABC score to equal to or greater than   15points   to demonstrate statistically significant improvement in mobility and quality of life.  Baseline: 18 Goal status: INITIAL  2.  Patient (> 77 years old) will complete five times sit to stand test in < 15 seconds indicating an increased LE strength and improved balance. Baseline: 35.95sec Goal status: INITIAL  3.  Patient will increase Berg Balance score by > 6 points to demonstrate decreased fall risk during functional activities Baseline: to be completed  Goal status: INITIAL  4.  Patient will increase 10 meter walk test to >0.30m/s as to improve gait speed for better community ambulation and to reduce fall risk. Baseline: 0.10m/s Goal status: INITIAL  5.  Patient will reduce timed up and go to <11 seconds to reduce fall risk and demonstrate improved transfer/gait ability. Baseline: 1:34 min with RW Goal status: INITIAL  ASSESSMENT:  CLINICAL IMPRESSION: Pt does well with integration of high intensity intervals this date. Heel strike improving, able to perform 6102ft continuous feet AMB without device. Pt will benefit from skilled PT to address focal weakness and abnormalities of gait and improved safety and independence to allow return to PLOF.   OBJECTIVE IMPAIRMENTS: Abnormal gait, decreased activity tolerance, decreased balance, decreased coordination, decreased endurance, decreased knowledge of condition, decreased knowledge of use of DME, decreased mobility, difficulty walking, decreased ROM, decreased strength, impaired sensation, impaired UE functional use, improper body mechanics, and pain.   ACTIVITY LIMITATIONS: lifting, standing, squatting, sleeping, transfers, bed mobility, bathing, reach over head, hygiene/grooming, locomotion level, and caring for others  PARTICIPATION LIMITATIONS: cleaning, laundry, driving, shopping, community activity, occupation, and yard  work  PERSONAL FACTORS: 3+ comorbidities: HTN, seizures, CVA, functional movement disorder are also affecting patient's functional outcome.   REHAB POTENTIAL: Excellent  CLINICAL DECISION MAKING: Evolving/moderate complexity  EVALUATION COMPLEXITY:  Moderate  PLAN:  PT FREQUENCY: 1-2x/week  PT DURATION: 12 weeks  PLANNED INTERVENTIONS: 97164- PT Re-evaluation, 97750- Physical Performance Testing, 97110-Therapeutic exercises, 97530- Therapeutic activity, 97112- Neuromuscular re-education, 97535- Self Care, 54098- Manual therapy, [phone]- Gait training, [phone]- Orthotic Initial, [phone]- Orthotic/Prosthetic subsequent, [phone]- Aquatic Therapy, Patient/Family education, Balance training, Stair training, Taping, Dry Needling, Joint mobilization, Joint manipulation, Spinal manipulation, Spinal mobilization, Vestibular training, Visual/preceptual remediation/compensation, DME instructions, Cryotherapy, and Moist heat  PLAN FOR NEXT SESSION:  - further gait training focused on heel contact and reduced stop/start pattern  - continued use of DF assist ACE wrap & 4lb AW as appropriate  - follow-up on ASO brace - Progress to more dynamic gait with stepping in variable directions when not concerned about ankle inversion rolling   11:46 AM, 08/11/23 Dawn Eth, PT, DPT Physical Therapist - [facility]  [phone] [facility])

## 2023-08-15 ENCOUNTER — Encounter: Payer: Self-pay | Admitting: Obstetrics and Gynecology

## 2023-08-15 ENCOUNTER — Other Ambulatory Visit (HOSPITAL_COMMUNITY)
Admission: RE | Admit: 2023-08-15 | Discharge: 2023-08-15 | Disposition: A | Discharge status: Home / Self Care | Payer: MEDICAID | Source: Ambulatory Visit | Attending: Obstetrics and Gynecology | Admitting: Obstetrics and Gynecology

## 2023-08-15 ENCOUNTER — Ambulatory Visit: Payer: MEDICAID | Admitting: Obstetrics and Gynecology

## 2023-08-15 ENCOUNTER — Other Ambulatory Visit: Payer: Self-pay

## 2023-08-15 VITALS — BP 130/92 | HR 85 | Wt 147.9 lb

## 2023-08-15 DIAGNOSIS — Z113 Encounter for screening for infections with a predominantly sexual mode of transmission: Secondary | ICD-10-CM | POA: Insufficient documentation

## 2023-08-15 DIAGNOSIS — Z124 Encounter for screening for malignant neoplasm of cervix: Secondary | ICD-10-CM | POA: Diagnosis present

## 2023-08-15 DIAGNOSIS — N930 Postcoital and contact bleeding: Secondary | ICD-10-CM

## 2023-08-15 DIAGNOSIS — Z1331 Encounter for screening for depression: Secondary | ICD-10-CM

## 2023-08-15 DIAGNOSIS — Z01419 Encounter for gynecological examination (general) (routine) without abnormal findings: Secondary | ICD-10-CM | POA: Diagnosis present

## 2023-08-15 MED ORDER — ESTRADIOL 0.1 MG/GM VA CREA: TOPICAL_CREAM | VAGINAL | 12 refills | Status: AC

## 2023-08-15 NOTE — Progress Notes (Signed)
 NEW GYNECOLOGY PATIENT Patient name: Heather Figueroa MRN [id number]  Date of birth: August 28, 1970 Chief Complaint:   Gynecologic Exam     History:  Heather Figueroa is a 53 y.o. [phone] being seen today for spotting. Like pink spotting a few times since march; same sex relationship- engaged in digital penetration but it was uncomfortable; had not engaged in sexual intercourse in a year. No pain when spotting occurs. Hysterectomy in 2002; for 10 year would have PMS symptoms but those have resolved as well.   Per chart review: cervix present 2020; hx of supracervical hysterectomy for AUB and endometriosis   Discussed the use of AI scribe software for clinical note transcription with the patient, who gave verbal consent to proceed.  History of Present Illness Heather Figueroa is a 53 year old female who presents with intermittent post-coital spotting.  She has been experiencing intermittent pink spotting since mid-March 2025. The spotting initially occurred after reengaging in intercourse after a year and a half of abstinence. It recurred in April without intercourse, lasting a couple of days, and the most recent episode occurred a couple of weeks before her birthday, also without intercourse. No associated pain or sensation of dryness during these episodes.  She has a history of a hysterectomy performed several years ago and has not experienced bleeding since the procedure until the recent episodes. She also has a history of abnormal Pap smears during her early teenage years, around ages 70 to 84.  She has a diagnosis of irritable bowel syndrome (IBS) and is on medication for it, but reports no changes in her bowel habits or urinary symptoms. No recent urinary tract infections or other infections.  During the review of symptoms, she reports no sensation of dryness, itching, or other discomforts. She wants to be checked for sexually transmitted diseases (STDs) during the visit, despite not  experiencing any symptoms suggestive of infections.       Gynecologic History No LMP recorded. Patient has had a hysterectomy. Contraception: female partner Last Pap:     Component Value Date/Time   DIAGPAP  01/16/2019 1358    - Negative for intraepithelial lesion or malignancy (NILM)   HPVHIGH Negative 01/16/2019 1358   ADEQPAP  01/16/2019 1358    Satisfactory for evaluation; transformation zone component PRESENT.   Last Mammogram: 2022 Last Colonoscopy: 2024  Obstetric History OB History  Gravida Para Term Preterm AB Living  6    2 4   SAB IAB Ectopic Multiple Live Births  1  1  4     # Outcome Date GA Lbr Len/2nd Weight Sex Type Anes PTL Lv  6 Gravida           5 Gravida           4 Gravida           3 Gravida           2 SAB           1 Ectopic             Past Medical History:  Diagnosis Date   Anemia    Anxiety    Arthritis    Depression    Headache    Hypertension    Neuromuscular disorder (HCC)    Seizures (HCC)    pt has had 2 seizures in July 2018   Stroke [facility])    possible TIA in July 2018    Past Surgical History:  Procedure Laterality Date  ABDOMINAL HYSTERECTOMY     2002   ANTERIOR CERVICAL DECOMP/DISCECTOMY FUSION N/A 01/26/2017   Procedure: ANTERIOR CERVICAL DECOMPRESSION/DISCECTOMY FUSION, INTERBODY PROSTHESIS, PLATE/SCREWS, POSSIBLE CORPECTOMY CERVICAL FOUR- CERVICAL FIVE, CERVICAL FIVE- CERVICAL SIX, CERVICAL SIX- CERVICAL SEVEN;  Surgeon: Mavis Purchase, MD;  Location: [facility] OR;  Service: Neurosurgery;  Laterality: N/A;  ANTERIOR CERVICAL DECOMPRESSION/DISCECTOMY FUSION, INTERBODY PROSTHESIS, PLATE/SCREWS, POSSIBLE CORPE   APPENDECTOMY     2010   COLONOSCOPY N/A 04/05/2022   Procedure: COLONOSCOPY;  Surgeon: Onita Elspeth Sharper, DO;  Location: [facility] ENDOSCOPY;  Service: Gastroenterology;  Laterality: N/A;   ESOPHAGOGASTRODUODENOSCOPY N/A 04/05/2022   Procedure: ESOPHAGOGASTRODUODENOSCOPY (EGD);  Surgeon: Onita Elspeth Sharper, DO;   Location: [facility] ENDOSCOPY;  Service: Gastroenterology;  Laterality: N/A;   OVARIAN CYST REMOVAL     TUBAL LIGATION     1998    Current Outpatient Medications on File Prior to Visit  Medication Sig Dispense Refill   cholecalciferol (VITAMIN D3) 25 MCG (1000 UNIT) tablet Take 1,000 Units by mouth daily.     lamoTRIgine  (LAMICTAL ) 100 MG tablet Take 100 mg by mouth daily.     linaclotide  (LINZESS ) 72 MCG capsule Take 72 mcg by mouth daily before breakfast.     metoprolol  succinate (TOPROL  XL) 25 MG 24 hr tablet Take 1 tablet (25 mg total) by mouth daily at 10 pm. 90 tablet 0   pantoprazole  (PROTONIX ) 40 MG tablet Take 40 mg by mouth every morning.     QUEtiapine  (SEROQUEL ) 300 MG tablet Take 300 mg by mouth at bedtime.     rizatriptan (MAXALT) 10 MG tablet Take 10 mg by mouth as needed for migraine.      traZODone  (DESYREL ) 100 MG tablet Take 150 mg by mouth at bedtime.     venlafaxine  XR (EFFEXOR -XR) 150 MG 24 hr capsule Take 150 mg by mouth daily with breakfast.     amLODipine  (NORVASC ) 5 MG tablet Take 1 tablet by mouth daily at 12 noon. (Patient not taking: Reported on 08/15/2023)     metFORMIN (GLUCOPHAGE) 500 MG tablet Take 2 tablets by mouth 2 (two) times daily with a meal. (Patient not taking: Reported on 08/15/2023)     sucralfate (CARAFATE) 1 g tablet Take 1 g by mouth 4 (four) times daily.     No current facility-administered medications on file prior to visit.    Allergies  Allergen Reactions   Pentazocine Anaphylaxis    Talwin   Percocet [Oxycodone -Acetaminophen ] Other (See Comments)    Swelling generalized   Tylenol  [Acetaminophen ] Swelling    Social History:  reports that she has been smoking cigarettes. She has never used smokeless tobacco. She reports current alcohol use. She reports that she does not use drugs.  Family History  Problem Relation Age of Onset   Colon cancer Mother    Hypertension Father    Prostate cancer Father    Pulmonary embolism Sister     Bladder Cancer Maternal Grandmother     The following portions of the patient's history were reviewed and updated as appropriate: allergies, current medications, past family history, past medical history, past social history, past surgical history and problem list.  Review of Systems Pertinent items noted in HPI and remainder of comprehensive ROS otherwise negative.  Physical Exam:  BP (!) 139/91   Pulse 98   Wt 147 lb 14.4 oz (67.1 kg)   BMI 27.95 kg/m  Physical Exam Vitals and nursing note reviewed. Exam conducted with a chaperone present.  Constitutional:      Appearance:  Normal appearance.  Pulmonary:     Effort: Pulmonary effort is normal.  Abdominal:     Palpations: Abdomen is soft.  Genitourinary:    General: Normal vulva.     Exam position: Lithotomy position.     Comments: Mild atrophic changes  Small atrophic cervix without lesions, non-friable Neurological:     Mental Status: She is alert.        Assessment and Plan:   Assessment & Plan Postmenopausal/Postcoital Bleeding Intermittent spotting due to decreased estrogen levels causing tissue fragility. - s/p Pap smear - STD screening completed - Prescribe vaginal estrogen to improve tissue elasticity. - Advise use of lubricant and foreplay during intercourse.  General Health Maintenance - Cervical cancer screening: Discussed guidelines. Pap with HPV collected - Breast Health: Encouraged self breast awareness/SBE. Discussed limits of clinical breast exam for detecting breast cancer. Discussed importance of annual MXR. 2022 BIRADS 1 - Climacteric/Sexual health: Reviewed typical and atypical symptoms of menopause/peri-menopause. Discussed PMB and to call if any amount of spotting.  - Colonoscopy: 2024  Routine preventative health maintenance measures emphasized. Please refer to After Visit Summary for other counseling recommendations.   Follow-up: No follow-ups on file.      Carter Quarry,  MD Obstetrician & Gynecologist, Faculty Practice [facility]im[facility]nec[facility] for [employer], [facility]

## 2023-08-16 ENCOUNTER — Ambulatory Visit: Payer: Self-pay | Admitting: Obstetrics and Gynecology

## 2023-08-16 LAB — RPR+HBSAG+HCVAB+...
HIV Screen 4th Generation wRfx: NONREACTIVE
Hep C Virus Ab: NONREACTIVE
Hepatitis B Surface Ag: NEGATIVE
RPR Ser Ql: NONREACTIVE

## 2023-08-17 LAB — CYTOLOGY - PAP
Adequacy: ABSENT
Chlamydia: NEGATIVE
Comment: NEGATIVE
Comment: NEGATIVE
Comment: NEGATIVE
Comment: NORMAL
Diagnosis: NEGATIVE
High risk HPV: NEGATIVE
Neisseria Gonorrhea: NEGATIVE
Trichomonas: NEGATIVE

## 2023-08-18 ENCOUNTER — Ambulatory Visit: Payer: MEDICAID | Admitting: Physical Therapy

## 2023-08-18 DIAGNOSIS — R278 Other lack of coordination: Secondary | ICD-10-CM

## 2023-08-18 DIAGNOSIS — R269 Unspecified abnormalities of gait and mobility: Secondary | ICD-10-CM

## 2023-08-18 DIAGNOSIS — R262 Difficulty in walking, not elsewhere classified: Secondary | ICD-10-CM | POA: Diagnosis not present

## 2023-08-18 DIAGNOSIS — M6281 Muscle weakness (generalized): Secondary | ICD-10-CM

## 2023-08-18 NOTE — Therapy (Signed)
 OUTPATIENT PHYSICAL THERAPY TREATMENT   Patient Name: Heather Figueroa MRN: [id number] DOB:07/29/1970, 53 y.o., female Today's Date: 08/18/2023  PCP: Diedra Lame, MD REFERRING PROVIDER: Diedra Lame, MD  END OF SESSION:   PT End of Session - 08/18/23 1152     Visit Number 6    Number of Visits 24    Date for PT Re-Evaluation 09/14/23    Authorization Type [employer]    Progress Note Due on Visit 10    PT Start Time 1152    PT Stop Time 1233    PT Time Calculation (min) 41 min    Equipment Utilized During Treatment Gait belt    Activity Tolerance Patient tolerated treatment well    Behavior During Therapy [facility] for tasks assessed/performed           Past Medical History:  Diagnosis Date   Anemia    Anxiety    Arthritis    Depression    Headache    Hypertension    Neuromuscular disorder (HCC)    Seizures (HCC)    pt has had 2 seizures in July 2018   Stroke [facility])    possible TIA in July 2018   Past Surgical History:  Procedure Laterality Date   ABDOMINAL HYSTERECTOMY     2002   ANTERIOR CERVICAL DECOMP/DISCECTOMY FUSION N/A 01/26/2017   Procedure: ANTERIOR CERVICAL DECOMPRESSION/DISCECTOMY FUSION, INTERBODY PROSTHESIS, PLATE/SCREWS, POSSIBLE CORPECTOMY CERVICAL FOUR- CERVICAL FIVE, CERVICAL FIVE- CERVICAL SIX, CERVICAL SIX- CERVICAL SEVEN;  Surgeon: Mavis Purchase, MD;  Location: [facility] OR;  Service: Neurosurgery;  Laterality: N/A;  ANTERIOR CERVICAL DECOMPRESSION/DISCECTOMY FUSION, INTERBODY PROSTHESIS, PLATE/SCREWS, POSSIBLE CORPE   APPENDECTOMY     2010   COLONOSCOPY N/A 04/05/2022   Procedure: COLONOSCOPY;  Surgeon: Onita Elspeth Sharper, DO;  Location: [facility] ENDOSCOPY;  Service: Gastroenterology;  Laterality: N/A;   ESOPHAGOGASTRODUODENOSCOPY N/A 04/05/2022   Procedure: ESOPHAGOGASTRODUODENOSCOPY (EGD);  Surgeon: Onita Elspeth Sharper, DO;  Location: [facility] ENDOSCOPY;  Service: Gastroenterology;  Laterality: N/A;   OVARIAN CYST REMOVAL      TUBAL LIGATION     1998   Patient Active Problem List   Diagnosis Date Noted   Prediabetes 05/24/2023   Seizure (HCC) 05/23/2023   ASD (atrial septal defect): Possible 06/20/2021   Smoking history 06/20/2021   Patent foramen ovale 06/20/2021   Hypokalemia 06/19/2021   Atypical chest pain 06/18/2021   Seizure disorder (HCC) 03/20/2021   Hypertension associated with diabetes (HCC) 03/20/2021   Bipolar disorder, in partial remission, most recent episode depressed (HCC) 03/20/2021   Well woman exam with routine gynecological exam 01/16/2019   Cervical spondylosis with myelopathy and radiculopathy 01/26/2017   Depression 12/18/2015   ONSET DATE: 05/23/2023  REFERRING DIAG:  Diagnosis  R53.1 (ICD-10-CM) - Weakness    THERAPY DIAG:  Difficulty in walking, not elsewhere classified  Other lack of coordination  Muscle weakness (generalized)  Abnormality of gait  Rationale for Evaluation and Treatment: Rehabilitation  SUBJECTIVE:  SUBJECTIVE STATEMENT:  Pt reports she is doing well, but feels she overheats in the warm weather outside. States not too much in regards to the level of pain she is currently having. Denies falls, but reports having a little stumble yesterday evening and this morning, but able to recover balance to prevent fall.  Reports she is still waiting for the new ASO to come in the mail. States she had a death in the family and was able to walk out in the community a little without the walker when at the funeral.   PERTINENT HISTORY:  Pt reports that she had Seizure and CVA  roughly 3-4 weeks ago followed by hospital admission. Reports that R side, UE and LE were both affected. States that she now has difficulty with gait, bathing, transfers, and dressing tasks due to  increased R side weakness. Due to lack of payor difficulties, she Reports that she received no PT or OT  following recent hospitalizaiton. Neck surgery in 2018, following which she had to re-learn how to walk Reports that she started having weakness in 2017 after they brought me back to life.   PAIN: None today     PRECAUTIONS: Fall  RED FLAGS: None  WEIGHT BEARING RESTRICTIONS: limited by strength, no official restriction from Cspine surgery 2018  FALLS: Has patient fallen in last 6 months? No  LIVING ENVIRONMENT: Lives with: lives with their son Lives in: House/apartment Stairs: Yes: External: 2 steps; none deep steps that walker can fit  Has following equipment at home: Walker - 2 wheeled  PLOF: Independent with basic ADLs and Independent with household mobility without device  PATIENT GOALS: Get stronger. Walk without walker.   OBJECTIVE:  Note: Objective measures were completed at Evaluation unless otherwise noted.  DIAGNOSTIC FINDINGS:   IMPRESSION: 1. No acute intracranial abnormality. 2. No seizure focus identified. 3. Findings of chronic small vessel ischemia.  COGNITION: Overall cognitive status: Within functional limits for tasks assessed   SENSATION: Light touch: Impaired   COORDINATION: Finger to nose: mild ataxia and decreased ROM due to strength/pain  Ankle knee:   POSTURE: rounded shoulders, forward head, and elevated shoulders  LOWER EXTREMITY ROM:    To be assessed   LOWER EXTREMITY MMT:    MMT Right Eval Left Eval  Hip flexion 3 4-  Hip extension 3+ 4  Hip abduction 4- 4-  Hip adduction 4- 4-  Hip internal rotation    Hip external rotation    Knee flexion 2 4-  Knee extension 3 4  Ankle dorsiflexion 2 4-  Ankle plantarflexion    Ankle inversion    Ankle eversion    (Blank rows = not tested)  BED MOBILITY:  Not tested  TRANSFERS: Sit to stand: SBA  Assistive device utilized: Environmental consultant - 2 wheeled     Stand to sit: SBA   Assistive device utilized: Environmental consultant - 2 wheeled     Chair to chair: SBA  Assistive device utilized: Environmental consultant - 2 wheeled       GAIT: Findings: Gait Characteristics: step to pattern, step through pattern, decreased stride length, Right steppage, Right foot flat, and poor foot clearance- Right, Distance walked: inconsistent step to leading with RLE and then LLE, Assistive device utilized:Walker - 2 wheeled, Level of assistance: CGA, and Comments: inconstitent step to pattern, inconsistent foot drop drag on the RLE. Noted to have midfoot contact in turns, but toe contact   FUNCTIONAL TESTS:  5 times sit to stand: 35.95 Timed up and  go (TUG): 1:34 min with RW  10 meter walk test: 2:11 min with RW. 0.69m/s  Berg Balance Scale: 31  PATIENT SURVEYS:  ABC scale 18%                                                                                                                            TREATMENT DATE: 08/18/2023  Gait in/out of clinic using pt's personal RW mod-I with pt more consistently landing with heel strike at initial contact without cuing.  Gait training 426ft, no AD, with CGA/occasional light min A for safety/steadying. Pt demonstrating the following gait deviations with therapist providing the described cuing and facilitation for improvement:  - consistent reciprocal stepping pattern throughout! - improving R heel strike on initial contact  - continues to have bilateral light ankle instability (R>L), but no risk of ankle inversion rolling noted - continues to have very slow gait speed with increased time in double-limb support - cuing for pt to match the therapist's steps to increase fluidity and consistency of steps with this improving her gait mechanics significantly, but only temporary  Sit<>stands 2x 10reps from green chair holding 3lb dumbbells in each hand with SBA for safety - cuing to tap hips down between each rep rather than sitting down fully - cuing to avoid knees adducting when  lowering - cuing to bring feet back underneath BOS further    Stair navigation training ascending/descending 4 steps x3 reps (6 height) wearing 4lb AW bilaterally, using B HRs with CGA/occasional light min assist for steadying - cuing for reciprocal stepping pattern in both directions with great success! Cuing to decrease B UE support on handrails and pt using them primarily for balance. Pt continues to move slow/cautious with this, especially when turning around at the top of the steps.   Gait training, wearing 4lb AW bilatearlly, using 2 Blaze Pods set on sequence setting, placed approximately 71ft apart with goal of making it to the next target within 10 seconds; however, pt unsuccessful, needing ~3-5 seconds longer to turn and reach the other pod. Achived 10 Hits in 49min30sec with pt noticing to have improving gait speed and confidence with repetition. This was a great challenge for patient to increase gait speed and also improve balance when turning. Requires CGA/min A for steadying balance, especially when turning.  PATIENT EDUCATION: Education details: POC. HEP. Pt educated throughout session about proper posture and technique with exercises. Improved exercise technique, movement at target joints, use of target muscles after min to mod verbal, visual, tactile cues.  Person educated: Patient Education method: Explanation Education comprehension: verbalized understanding  HOME EXERCISE PROGRAM:   Access Code: [email or web address] URL: [email or web address] Date: 07/28/2023 Prepared by: Connell Kiss  Exercises - Sit to Stand with Hands on Knees  - 1 x daily - 7 x weekly - 2 sets - 10 reps - Standing March with Counter Support  - 1 x daily - 4 x weekly - 3 sets - 10 reps -  Heel Toe Raises with Counter Support  - 1 x daily - 4 x weekly - 3 sets - 10 reps - Backward Walking with Counter Support  - 1 x daily - 4 x weekly - 3 sets - 5 reps    GOALS: Goals reviewed with patient?  Yes   SHORT TERM GOALS: Target date: 07/20/2023  Patient will be independent in home exercise program to improve strength/mobility for better functional independence with ADLs. Baseline: initiated on 06/29/2023  07/28/2023: updated Goal status: INITIAL   LONG TERM GOALS: Target date: 09/14/2023  Patient will increase ABC score to equal to or greater than   15points   to demonstrate statistically significant improvement in mobility and quality of life.  Baseline: 18 Goal status: INITIAL  2.  Patient (> 35 years old) will complete five times sit to stand test in < 15 seconds indicating an increased LE strength and improved balance. Baseline: 35.95sec Goal status: INITIAL  3.  Patient will increase Berg Balance score by > 6 points to demonstrate decreased fall risk during functional activities Baseline: to be completed  Goal status: INITIAL  4.  Patient will increase 10 meter walk test to >0.76m/s as to improve gait speed for better community ambulation and to reduce fall risk. Baseline: 0.79m/s Goal status: INITIAL  5.  Patient will reduce timed up and go to <11 seconds to reduce fall risk and demonstrate improved transfer/gait ability. Baseline: 1:34 min with RW Goal status: INITIAL  ASSESSMENT:  CLINICAL IMPRESSION:  Therapy session focused on continued gait training targeting improved fluidity of forward momentum and increasing gait speed for R LE NMR. Patient responds well to visual/mirror cuing of timing her steps with therapist's steps; however, unable to sustain this over time. Patient continues to demonstrates ankle instability during gait with pt reporting she feels off balance. Patient responds well to the final intervention of having timed walking between 2 Blaze Pods to encourage her to reach a certain distance in <10 seconds while having to turn around at end of each distance for added dynamic balance challenge. Pt will benefit from continued skilled PT to address focal  weakness and abnormalities of gait and improve safety and independence to allow return to PLOF.   OBJECTIVE IMPAIRMENTS: Abnormal gait, decreased activity tolerance, decreased balance, decreased coordination, decreased endurance, decreased knowledge of condition, decreased knowledge of use of DME, decreased mobility, difficulty walking, decreased ROM, decreased strength, impaired sensation, impaired UE functional use, improper body mechanics, and pain.   ACTIVITY LIMITATIONS: lifting, standing, squatting, sleeping, transfers, bed mobility, bathing, reach over head, hygiene/grooming, locomotion level, and caring for others  PARTICIPATION LIMITATIONS: cleaning, laundry, driving, shopping, community activity, occupation, and yard work  PERSONAL FACTORS: 3+ comorbidities: HTN, seizures, CVA, functional movement disorder are also affecting patient's functional outcome.   REHAB POTENTIAL: Excellent  CLINICAL DECISION MAKING: Evolving/moderate complexity  EVALUATION COMPLEXITY: Moderate  PLAN:  PT FREQUENCY: 1-2x/week  PT DURATION: 12 weeks  PLANNED INTERVENTIONS: 97164- PT Re-evaluation, 97750- Physical Performance Testing, 97110-Therapeutic exercises, 97530- Therapeutic activity, [id number]- Neuromuscular re-education, 97535- Self Care, [id number]- Manual therapy, [id number]- Gait training, [phone]- Orthotic Initial, [phone]- Orthotic/Prosthetic subsequent, [phone]- Aquatic Therapy, Patient/Family education, Balance training, Stair training, Taping, Dry Needling, Joint mobilization, Joint manipulation, Spinal manipulation, Spinal mobilization, Vestibular training, Visual/preceptual remediation/compensation, DME instructions, Cryotherapy, and Moist heat  PLAN FOR NEXT SESSION:  - further gait training focused on heel contact and reduced stop/start pattern  - continued use of 4lb AW as appropriate  - follow-up on ASO brace -  Progress to more dynamic gait with stepping in variable directions when not concerned about  ankle inversion rolling - continue timed laps using Blaze Pods     Keaton Beichner, PT, DPT, NCS, CSRS Physical Therapist - [place]  [facility]  12:34 PM 08/18/23

## 2023-08-25 ENCOUNTER — Ambulatory Visit: Payer: MEDICAID | Attending: Family Medicine | Admitting: Physical Therapy

## 2023-08-25 DIAGNOSIS — R269 Unspecified abnormalities of gait and mobility: Secondary | ICD-10-CM | POA: Diagnosis present

## 2023-08-25 DIAGNOSIS — R278 Other lack of coordination: Secondary | ICD-10-CM | POA: Insufficient documentation

## 2023-08-25 DIAGNOSIS — M6281 Muscle weakness (generalized): Secondary | ICD-10-CM | POA: Insufficient documentation

## 2023-08-25 DIAGNOSIS — R262 Difficulty in walking, not elsewhere classified: Secondary | ICD-10-CM | POA: Insufficient documentation

## 2023-08-25 NOTE — Therapy (Signed)
 OUTPATIENT PHYSICAL THERAPY TREATMENT   Patient Name: Heather Figueroa MRN: [id number] DOB:03/17/1970, 53 y.o., female Today's Date: 08/25/2023  PCP: Diedra Lame, MD REFERRING PROVIDER: Diedra Lame, MD  END OF SESSION:   PT End of Session - 08/25/23 1148     Visit Number 7    Number of Visits 24    Date for PT Re-Evaluation 09/14/23    Authorization Type [employer]    Progress Note Due on Visit 10    PT Start Time 1148    PT Stop Time 1233    PT Time Calculation (min) 45 min    Equipment Utilized During Treatment Gait belt    Activity Tolerance Patient tolerated treatment well    Behavior During Therapy [facility] for tasks assessed/performed            Past Medical History:  Diagnosis Date   Anemia    Anxiety    Arthritis    Depression    Headache    Hypertension    Neuromuscular disorder (HCC)    Seizures (HCC)    pt has had 2 seizures in July 2018   Stroke [facility])    possible TIA in July 2018   Past Surgical History:  Procedure Laterality Date   ABDOMINAL HYSTERECTOMY     2002   ANTERIOR CERVICAL DECOMP/DISCECTOMY FUSION N/A 01/26/2017   Procedure: ANTERIOR CERVICAL DECOMPRESSION/DISCECTOMY FUSION, INTERBODY PROSTHESIS, PLATE/SCREWS, POSSIBLE CORPECTOMY CERVICAL FOUR- CERVICAL FIVE, CERVICAL FIVE- CERVICAL SIX, CERVICAL SIX- CERVICAL SEVEN;  Surgeon: Mavis Purchase, MD;  Location: [facility] OR;  Service: Neurosurgery;  Laterality: N/A;  ANTERIOR CERVICAL DECOMPRESSION/DISCECTOMY FUSION, INTERBODY PROSTHESIS, PLATE/SCREWS, POSSIBLE CORPE   APPENDECTOMY     2010   COLONOSCOPY N/A 04/05/2022   Procedure: COLONOSCOPY;  Surgeon: Onita Elspeth Sharper, DO;  Location: [facility] ENDOSCOPY;  Service: Gastroenterology;  Laterality: N/A;   ESOPHAGOGASTRODUODENOSCOPY N/A 04/05/2022   Procedure: ESOPHAGOGASTRODUODENOSCOPY (EGD);  Surgeon: Onita Elspeth Sharper, DO;  Location: [facility] ENDOSCOPY;  Service: Gastroenterology;  Laterality: N/A;   OVARIAN CYST REMOVAL      TUBAL LIGATION     1998   Patient Active Problem List   Diagnosis Date Noted   Prediabetes 05/24/2023   Seizure (HCC) 05/23/2023   ASD (atrial septal defect): Possible 06/20/2021   Smoking history 06/20/2021   Patent foramen ovale 06/20/2021   Hypokalemia 06/19/2021   Atypical chest pain 06/18/2021   Seizure disorder (HCC) 03/20/2021   Hypertension associated with diabetes (HCC) 03/20/2021   Bipolar disorder, in partial remission, most recent episode depressed (HCC) 03/20/2021   Well woman exam with routine gynecological exam 01/16/2019   Cervical spondylosis with myelopathy and radiculopathy 01/26/2017   Depression 12/18/2015   ONSET DATE: 05/23/2023  REFERRING DIAG:  Diagnosis  R53.1 (ICD-10-CM) - Weakness    THERAPY DIAG:  Difficulty in walking, not elsewhere classified  Other lack of coordination  Muscle weakness (generalized)  Abnormality of gait  Rationale for Evaluation and Treatment: Rehabilitation  SUBJECTIVE:  SUBJECTIVE STATEMENT:  Pt states she is a little tired from keeping 4 of her grandchildren for the past ~week. Pt wearing her ASO today and states she received it yesterday.  Pt states recently she went to help a friend in [place] (assisting with caring for the friend's mother) and was having significant R lateral lower leg pain afterwards because she was on her toes more to maintain her balance on the slippery floor and from repeatedly getting up/down throughout the day. Patient sates she wasn't doing any lifting or anything, but was just up on her feet a lot. Pt states she is having to go back to [place] this weekend to help her again. Pt states she is wondering if the ASO will help decrease the pain.  Denies pain to start session.  Denies falls.   PERTINENT HISTORY:  Pt  reports that she had Seizure and CVA  roughly 3-4 weeks ago followed by hospital admission. Reports that R side, UE and LE were both affected. States that she now has difficulty with gait, bathing, transfers, and dressing tasks due to increased R side weakness. Due to lack of payor difficulties, she Reports that she received no PT or OT  following recent hospitalizaiton. Neck surgery in 2018, following which she had to re-learn how to walk Reports that she started having weakness in 2017 after they brought me back to life.   PAIN: None today     PRECAUTIONS: Fall  RED FLAGS: None  WEIGHT BEARING RESTRICTIONS: limited by strength, no official restriction from Cspine surgery 2018  FALLS: Has patient fallen in last 6 months? No  LIVING ENVIRONMENT: Lives with: lives with their son Lives in: House/apartment Stairs: Yes: External: 2 steps; none deep steps that walker can fit  Has following equipment at home: Walker - 2 wheeled  PLOF: Independent with basic ADLs and Independent with household mobility without device  PATIENT GOALS: Get stronger. Walk without walker.   OBJECTIVE:  Note: Objective measures were completed at Evaluation unless otherwise noted.  DIAGNOSTIC FINDINGS:   IMPRESSION: 1. No acute intracranial abnormality. 2. No seizure focus identified. 3. Findings of chronic small vessel ischemia.  COGNITION: Overall cognitive status: Within functional limits for tasks assessed   SENSATION: Light touch: Impaired   COORDINATION: Finger to nose: mild ataxia and decreased ROM due to strength/pain  Ankle knee:   POSTURE: rounded shoulders, forward head, and elevated shoulders  LOWER EXTREMITY ROM:    To be assessed   LOWER EXTREMITY MMT:    MMT Right Eval Left Eval  Hip flexion 3 4-  Hip extension 3+ 4  Hip abduction 4- 4-  Hip adduction 4- 4-  Hip internal rotation    Hip external rotation    Knee flexion 2 4-  Knee extension 3 4  Ankle dorsiflexion 2  4-  Ankle plantarflexion    Ankle inversion    Ankle eversion    (Blank rows = not tested)  BED MOBILITY:  Not tested  TRANSFERS: Sit to stand: SBA  Assistive device utilized: Environmental consultant - 2 wheeled     Stand to sit: SBA  Assistive device utilized: Environmental consultant - 2 wheeled     Chair to chair: SBA  Assistive device utilized: Environmental consultant - 2 wheeled       GAIT: Findings: Gait Characteristics: step to pattern, step through pattern, decreased stride length, Right steppage, Right foot flat, and poor foot clearance- Right, Distance walked: inconsistent step to leading with RLE and then LLE, Assistive device utilized:Walker -  2 wheeled, Level of assistance: CGA, and Comments: inconstitent step to pattern, inconsistent foot drop drag on the RLE. Noted to have midfoot contact in turns, but toe contact   FUNCTIONAL TESTS:  5 times sit to stand: 35.95 Timed up and go (TUG): 1:34 min with RW  10 meter walk test: 2:11 min with RW. 0.94m/s  Berg Balance Scale: 31  PATIENT SURVEYS:  ABC scale 18%                                                                                                                            TREATMENT DATE: 08/25/2023  Gait in/out of clinic using pt's personal RW mod-I with decreased consistency of landing on R LE heel strike at initial contact without cuing.  Educated pt on proper donning/positioning of ASO to help decrease risk of supination/inversion ankle rolling. Educated on wearing a tall sock underneath ASO for skin protection. Educated pt to wear ASO when she is going to be doing more activity, but not to wear all the time. Patient to report back next visit whether the ASO helped her pain or not over the weekend in [place].  Gait training 334ft, no AD, with CGA/occasional light min A for safety/steadying. Pt demonstrating the following gait deviations with therapist providing the described cuing and facilitation for improvement:  - consistent reciprocal stepping pattern throughout! -  improving R heel strike on initial contact, especially down straight pathways - continues to have bilateral light ankle instability (R>L), with slight increased R ankle supination today that is inconsistent,  but does improve with repetition and cuing. But no risk of ankle inversion rolling noted - continues to have very slow gait speed with increased time in double-limb support, but improving today - cuing for pt to match the therapist's steps to increase fluidity and consistency of steps with this improving her gait mechanics and pt also responds well to distractions while ambulating with gentle tactile facilitation to increase gait speed    Sit<>stands 3x 10reps from green chair, progressed to holding 5lb dumbbells in each hand after 1st set with SBA for safety - cuing to tap hips down between each rep rather than sitting down fully - cuing to avoid knees adducting when lowering - cuing to plant great toes into ground to avoid risk of supinating R ankle in standing   Gait training using 2 Blaze Pods set on sequence setting, placed approximately 61ft apart with goal of making it to the next target within 8.5seconds decreased to 6.5seconds as pt's gait speed is improving. Pt successful in hitting target >95% of the time. Requires CGA with occasional light min A for steadying balance, especially when turning. In 45seconds achieved 10 hits - set on 8.5sec delay In 30min30sec achieved 20 hits - set on 6.5sec delay and pt was successful >95% of the time of getting to target in that time  Patient reports she feels sometimes she is overly focused on her gait mechanics and  discussed goal of her being able to think of other things while walking rather than needing her attention focused on her gait.  Stair navigation training ascending/descending 4 steps x2 reps (6 height), using only light B HR support as needed, otherwise hovering her hands. Therapist providing CGA/light min assist for steadying - cuing  for reciprocal stepping pattern in both directions with great success! Pt continues to have slow/cautious movements with this, especially when turning around at the top of the steps, but it is improving.  Resisted walking at cable machine against 7.5lb forwards and eccentric backwards control 2 laps with CGA/light min A for steadying - improved R foot placement and ankle control when pt having to take larger steps when pushing forward - increased challenge with eccentric control requiring short steps.  PATIENT EDUCATION: Education details: POC. HEP. Pt educated throughout session about proper posture and technique with exercises. Improved exercise technique, movement at target joints, use of target muscles after min to mod verbal, visual, tactile cues.  Person educated: Patient Education method: Explanation Education comprehension: verbalized understanding  HOME EXERCISE PROGRAM:   Access Code: [email or web address] URL: [email or web address] Date: 07/28/2023 Prepared by: Connell Kiss  Exercises - Sit to Stand with Hands on Knees  - 1 x daily - 7 x weekly - 2 sets - 10 reps - Standing March with Counter Support  - 1 x daily - 4 x weekly - 3 sets - 10 reps - Heel Toe Raises with Counter Support  - 1 x daily - 4 x weekly - 3 sets - 10 reps - Backward Walking with Counter Support  - 1 x daily - 4 x weekly - 3 sets - 5 reps    GOALS: Goals reviewed with patient? Yes   SHORT TERM GOALS: Target date: 07/20/2023  Patient will be independent in home exercise program to improve strength/mobility for better functional independence with ADLs. Baseline: initiated on 06/29/2023  07/28/2023: updated Goal status: INITIAL   LONG TERM GOALS: Target date: 09/14/2023  Patient will increase ABC score to equal to or greater than   15points   to demonstrate statistically significant improvement in mobility and quality of life.  Baseline: 18 Goal status: INITIAL  2.  Patient (> 55 years old) will  complete five times sit to stand test in < 15 seconds indicating an increased LE strength and improved balance. Baseline: 35.95sec Goal status: INITIAL  3.  Patient will increase Berg Balance score by > 6 points to demonstrate decreased fall risk during functional activities Baseline: to be completed  Goal status: INITIAL  4.  Patient will increase 10 meter walk test to >0.90m/s as to improve gait speed for better community ambulation and to reduce fall risk. Baseline: 0.61m/s Goal status: INITIAL  5.  Patient will reduce timed up and go to <11 seconds to reduce fall risk and demonstrate improved transfer/gait ability. Baseline: 1:34 min with RW Goal status: INITIAL  ASSESSMENT:  CLINICAL IMPRESSION:  Therapy session focused on continued gait training targeting improved fluidity of forward reciprocal steps and increasing gait speed for R LE NMR. Patient now has ASO brace; therefore, therapist educated on proper use/wear as well as educating pt on plan not to use it during future therapy sessions as pt's ankle control has been improving, will continue to assess whether she needs the brace going forward. Patient plans to wear the brace this weekend while helping her friend due to significant R LE lateral leg pain the last time she helped her friend. Patient  continues to demonstrate ankle instability during gait with pt reporting she feels off balance. Patient improved her gait speed with the 2 Blaze Pod task set to encourage her to reach a certain distance in <6.5 seconds while having to turn around at end of each distance for added dynamic balance challenge. Patient also responded well to resisted forward gait using cable machine for increased motor recruitment. Pt will benefit from continued skilled PT to address focal weakness and abnormalities of gait and improve safety and independence to allow return to PLOF.   OBJECTIVE IMPAIRMENTS: Abnormal gait, decreased activity tolerance, decreased  balance, decreased coordination, decreased endurance, decreased knowledge of condition, decreased knowledge of use of DME, decreased mobility, difficulty walking, decreased ROM, decreased strength, impaired sensation, impaired UE functional use, improper body mechanics, and pain.   ACTIVITY LIMITATIONS: lifting, standing, squatting, sleeping, transfers, bed mobility, bathing, reach over head, hygiene/grooming, locomotion level, and caring for others  PARTICIPATION LIMITATIONS: cleaning, laundry, driving, shopping, community activity, occupation, and yard work  PERSONAL FACTORS: 3+ comorbidities: HTN, seizures, CVA, functional movement disorder are also affecting patient's functional outcome.   REHAB POTENTIAL: Excellent  CLINICAL DECISION MAKING: Evolving/moderate complexity  EVALUATION COMPLEXITY: Moderate  PLAN:  PT FREQUENCY: 1-2x/week  PT DURATION: 12 weeks  PLANNED INTERVENTIONS: [id number]- PT Re-evaluation, 97750- Physical Performance Testing, 97110-Therapeutic exercises, 97530- Therapeutic activity, 97112- Neuromuscular re-education, 97535- Self Care, [id number]- Manual therapy, [phone]- Gait training, [phone]- Orthotic Initial, [phone]- Orthotic/Prosthetic subsequent, [phone]- Aquatic Therapy, Patient/Family education, Balance training, Stair training, Taping, Dry Needling, Joint mobilization, Joint manipulation, Spinal manipulation, Spinal mobilization, Vestibular training, Visual/preceptual remediation/compensation, DME instructions, Cryotherapy, and Moist heat  PLAN FOR NEXT SESSION:  - further gait training focused on heel contact and reduced stop/start pattern  - continued use of 4lb AW as appropriate  - follow-up on ASO brace - Progress to more dynamic gait with stepping in variable directions when not concerned about ankle inversion rolling - continue timed laps using Blaze Pods - continue resisted gait trianing     Sabrin Dunlevy, PT, DPT, NCS, CSRS Physical Therapist - [place]   [facility]  12:36 PM 08/25/23

## 2023-08-29 ENCOUNTER — Ambulatory Visit: Payer: MEDICAID | Admitting: Physical Therapy

## 2023-08-31 ENCOUNTER — Ambulatory Visit: Payer: MEDICAID | Admitting: Physical Therapy

## 2023-08-31 ENCOUNTER — Telehealth: Payer: Self-pay | Admitting: Physical Therapy

## 2023-08-31 NOTE — Therapy (Incomplete)
 OUTPATIENT PHYSICAL THERAPY TREATMENT   Patient Name: Heather Figueroa MRN: [id number] DOB:06-08-1970, 53 y.o., female Today's Date: 08/31/2023  PCP: Diedra Lame, MD REFERRING PROVIDER: Diedra Lame, MD  END OF SESSION: ***      Past Medical History:  Diagnosis Date   Anemia    Anxiety    Arthritis    Depression    Headache    Hypertension    Neuromuscular disorder (HCC)    Seizures (HCC)    pt has had 2 seizures in July 2018   Stroke [facility])    possible TIA in July 2018   Past Surgical History:  Procedure Laterality Date   ABDOMINAL HYSTERECTOMY     2002   ANTERIOR CERVICAL DECOMP/DISCECTOMY FUSION N/A 01/26/2017   Procedure: ANTERIOR CERVICAL DECOMPRESSION/DISCECTOMY FUSION, INTERBODY PROSTHESIS, PLATE/SCREWS, POSSIBLE CORPECTOMY CERVICAL FOUR- CERVICAL FIVE, CERVICAL FIVE- CERVICAL SIX, CERVICAL SIX- CERVICAL SEVEN;  Surgeon: Mavis Purchase, MD;  Location: [facility] OR;  Service: Neurosurgery;  Laterality: N/A;  ANTERIOR CERVICAL DECOMPRESSION/DISCECTOMY FUSION, INTERBODY PROSTHESIS, PLATE/SCREWS, POSSIBLE CORPE   APPENDECTOMY     2010   COLONOSCOPY N/A 04/05/2022   Procedure: COLONOSCOPY;  Surgeon: Onita Elspeth Sharper, DO;  Location: [facility] ENDOSCOPY;  Service: Gastroenterology;  Laterality: N/A;   ESOPHAGOGASTRODUODENOSCOPY N/A 04/05/2022   Procedure: ESOPHAGOGASTRODUODENOSCOPY (EGD);  Surgeon: Onita Elspeth Sharper, DO;  Location: [facility] ENDOSCOPY;  Service: Gastroenterology;  Laterality: N/A;   OVARIAN CYST REMOVAL     TUBAL LIGATION     1998   Patient Active Problem List   Diagnosis Date Noted   Prediabetes 05/24/2023   Seizure (HCC) 05/23/2023   ASD (atrial septal defect): Possible 06/20/2021   Smoking history 06/20/2021   Patent foramen ovale 06/20/2021   Hypokalemia 06/19/2021   Atypical chest pain 06/18/2021   Seizure disorder (HCC) 03/20/2021   Hypertension associated with diabetes (HCC) 03/20/2021   Bipolar disorder, in partial remission, most  recent episode depressed (HCC) 03/20/2021   Well woman exam with routine gynecological exam 01/16/2019   Cervical spondylosis with myelopathy and radiculopathy 01/26/2017   Depression 12/18/2015   ONSET DATE: 05/23/2023  REFERRING DIAG:  Diagnosis  R53.1 (ICD-10-CM) - Weakness    THERAPY DIAG: *** No diagnosis found.  Rationale for Evaluation and Treatment: Rehabilitation  SUBJECTIVE:                                                                                                                                                                                             SUBJECTIVE STATEMENT:  ***   Pt states she is a little tired from keeping 4 of her grandchildren for the past ~week. Pt wearing her  ASO today and states she received it yesterday.  Pt states recently she went to help a friend in [place] (assisting with caring for the friend's mother) and was having significant R lateral lower leg pain afterwards because she was on her toes more to maintain her balance on the slippery floor and from repeatedly getting up/down throughout the day. Patient sates she wasn't doing any lifting or anything, but was just up on her feet a lot. Pt states she is having to go back to [place] this weekend to help her again. Pt states she is wondering if the ASO will help decrease the pain.  Denies pain to start session.  Denies falls.   PERTINENT HISTORY:  Pt reports that she had Seizure and CVA  roughly 3-4 weeks ago followed by hospital admission. Reports that R side, UE and LE were both affected. States that she now has difficulty with gait, bathing, transfers, and dressing tasks due to increased R side weakness. Due to lack of payor difficulties, she Reports that she received no PT or OT  following recent hospitalizaiton. Neck surgery in 2018, following which she had to re-learn how to walk Reports that she started having weakness in 2017 after they brought me back to life.   PAIN: None today      PRECAUTIONS: Fall  RED FLAGS: None  WEIGHT BEARING RESTRICTIONS: limited by strength, no official restriction from Cspine surgery 2018  FALLS: Has patient fallen in last 6 months? No  LIVING ENVIRONMENT: Lives with: lives with their son Lives in: House/apartment Stairs: Yes: External: 2 steps; none deep steps that walker can fit  Has following equipment at home: Walker - 2 wheeled  PLOF: Independent with basic ADLs and Independent with household mobility without device  PATIENT GOALS: Get stronger. Walk without walker.   OBJECTIVE:  Note: Objective measures were completed at Evaluation unless otherwise noted.  DIAGNOSTIC FINDINGS:   IMPRESSION: 1. No acute intracranial abnormality. 2. No seizure focus identified. 3. Findings of chronic small vessel ischemia.  COGNITION: Overall cognitive status: Within functional limits for tasks assessed   SENSATION: Light touch: Impaired   COORDINATION: Finger to nose: mild ataxia and decreased ROM due to strength/pain  Ankle knee:   POSTURE: rounded shoulders, forward head, and elevated shoulders  LOWER EXTREMITY ROM:    To be assessed   LOWER EXTREMITY MMT:    MMT Right Eval Left Eval  Hip flexion 3 4-  Hip extension 3+ 4  Hip abduction 4- 4-  Hip adduction 4- 4-  Hip internal rotation    Hip external rotation    Knee flexion 2 4-  Knee extension 3 4  Ankle dorsiflexion 2 4-  Ankle plantarflexion    Ankle inversion    Ankle eversion    (Blank rows = not tested)  BED MOBILITY:  Not tested  TRANSFERS: Sit to stand: SBA  Assistive device utilized: Environmental consultant - 2 wheeled     Stand to sit: SBA  Assistive device utilized: Environmental consultant - 2 wheeled     Chair to chair: SBA  Assistive device utilized: Environmental consultant - 2 wheeled       GAIT: Findings: Gait Characteristics: step to pattern, step through pattern, decreased stride length, Right steppage, Right foot flat, and poor foot clearance- Right, Distance walked: inconsistent step  to leading with RLE and then LLE, Assistive device utilized:Walker - 2 wheeled, Level of assistance: CGA, and Comments: inconstitent step to pattern, inconsistent foot drop drag on the RLE. Noted to have  midfoot contact in turns, but toe contact   FUNCTIONAL TESTS:  5 times sit to stand: 35.95 Timed up and go (TUG): 1:34 min with RW  10 meter walk test: 2:11 min with RW. 0.106m/s  Berg Balance Scale: 31  PATIENT SURVEYS:  ABC scale 18%                                                                                                                            TREATMENT DATE: 08/31/2023  *** - further gait training focused on heel contact and reduced stop/start pattern  - continued use of 4lb AW as appropriate  - follow-up on ASO brace - Progress to more dynamic gait with stepping in variable directions when not concerned about ankle inversion rolling - continue timed laps using Blaze Pods - continue resisted gait trianing  ***  Gait in/out of clinic using pt's personal RW mod-I with decreased consistency of landing on R LE heel strike at initial contact without cuing.  Educated pt on proper donning/positioning of ASO to help decrease risk of supination/inversion ankle rolling. Educated on wearing a tall sock underneath ASO for skin protection. Educated pt to wear ASO when she is going to be doing more activity, but not to wear all the time. Patient to report back next visit whether the ASO helped her pain or not over the weekend in [place].  Gait training 340ft, no AD, with CGA/occasional light min A for safety/steadying. Pt demonstrating the following gait deviations with therapist providing the described cuing and facilitation for improvement:  - consistent reciprocal stepping pattern throughout! - improving R heel strike on initial contact, especially down straight pathways - continues to have bilateral light ankle instability (R>L), with slight increased R ankle supination today that is  inconsistent,  but does improve with repetition and cuing. But no risk of ankle inversion rolling noted - continues to have very slow gait speed with increased time in double-limb support, but improving today - cuing for pt to match the therapist's steps to increase fluidity and consistency of steps with this improving her gait mechanics and pt also responds well to distractions while ambulating with gentle tactile facilitation to increase gait speed    Sit<>stands 3x 10reps from green chair, progressed to holding 5lb dumbbells in each hand after 1st set with SBA for safety - cuing to tap hips down between each rep rather than sitting down fully - cuing to avoid knees adducting when lowering - cuing to plant great toes into ground to avoid risk of supinating R ankle in standing   Gait training using 2 Blaze Pods set on sequence setting, placed approximately 64ft apart with goal of making it to the next target within 8.5seconds decreased to 6.5seconds as pt's gait speed is improving. Pt successful in hitting target >95% of the time. Requires CGA with occasional light min A for steadying balance, especially when turning. In 45seconds achieved 10 hits - set on 8.5sec delay In  34min30sec achieved 20 hits - set on 6.5sec delay and pt was successful >95% of the time of getting to target in that time  Patient reports she feels sometimes she is overly focused on her gait mechanics and discussed goal of her being able to think of other things while walking rather than needing her attention focused on her gait.  Stair navigation training ascending/descending 4 steps x2 reps (6 height), using only light B HR support as needed, otherwise hovering her hands. Therapist providing CGA/light min assist for steadying - cuing for reciprocal stepping pattern in both directions with great success! Pt continues to have slow/cautious movements with this, especially when turning around at the top of the steps, but it is  improving.  Resisted walking at cable machine against 7.5lb forwards and eccentric backwards control 2 laps with CGA/light min A for steadying - improved R foot placement and ankle control when pt having to take larger steps when pushing forward - increased challenge with eccentric control requiring short steps.  ***   PATIENT EDUCATION: Education details: POC. HEP. Pt educated throughout session about proper posture and technique with exercises. Improved exercise technique, movement at target joints, use of target muscles after min to mod verbal, visual, tactile cues.  Person educated: Patient Education method: Explanation Education comprehension: verbalized understanding  HOME EXERCISE PROGRAM:   Access Code: [email or web address] URL: [email or web address] Date: 07/28/2023 Prepared by: Connell Kiss  Exercises - Sit to Stand with Hands on Knees  - 1 x daily - 7 x weekly - 2 sets - 10 reps - Standing March with Counter Support  - 1 x daily - 4 x weekly - 3 sets - 10 reps - Heel Toe Raises with Counter Support  - 1 x daily - 4 x weekly - 3 sets - 10 reps - Backward Walking with Counter Support  - 1 x daily - 4 x weekly - 3 sets - 5 reps    GOALS: Goals reviewed with patient? Yes   SHORT TERM GOALS: Target date: 07/20/2023  Patient will be independent in home exercise program to improve strength/mobility for better functional independence with ADLs. Baseline: initiated on 06/29/2023  07/28/2023: updated Goal status: INITIAL   LONG TERM GOALS: Target date: 09/14/2023  Patient will increase ABC score to equal to or greater than   15points   to demonstrate statistically significant improvement in mobility and quality of life.  Baseline: 18 Goal status: INITIAL  2.  Patient (> 49 years old) will complete five times sit to stand test in < 15 seconds indicating an increased LE strength and improved balance. Baseline: 35.95sec Goal status: INITIAL  3.  Patient will increase  Berg Balance score by > 6 points to demonstrate decreased fall risk during functional activities Baseline: to be completed  Goal status: INITIAL  4.  Patient will increase 10 meter walk test to >0.93m/s as to improve gait speed for better community ambulation and to reduce fall risk. Baseline: 0.109m/s Goal status: INITIAL  5.  Patient will reduce timed up and go to <11 seconds to reduce fall risk and demonstrate improved transfer/gait ability. Baseline: 1:34 min with RW Goal status: INITIAL  ASSESSMENT:  CLINICAL IMPRESSION: *** Therapy session focused on continued gait training targeting improved fluidity of forward reciprocal steps and increasing gait speed for R LE NMR. Patient now has ASO brace; therefore, therapist educated on proper use/wear as well as educating pt on plan not to use it during future therapy sessions as pt's ankle control  has been improving, will continue to assess whether she needs the brace going forward. Patient plans to wear the brace this weekend while helping her friend due to significant R LE lateral leg pain the last time she helped her friend. Patient continues to demonstrate ankle instability during gait with pt reporting she feels off balance. Patient improved her gait speed with the 2 Blaze Pod task set to encourage her to reach a certain distance in <6.5 seconds while having to turn around at end of each distance for added dynamic balance challenge. Patient also responded well to resisted forward gait using cable machine for increased motor recruitment. Pt will benefit from continued skilled PT to address focal weakness and abnormalities of gait and improve safety and independence to allow return to PLOF.   OBJECTIVE IMPAIRMENTS: Abnormal gait, decreased activity tolerance, decreased balance, decreased coordination, decreased endurance, decreased knowledge of condition, decreased knowledge of use of DME, decreased mobility, difficulty walking, decreased ROM,  decreased strength, impaired sensation, impaired UE functional use, improper body mechanics, and pain.   ACTIVITY LIMITATIONS: lifting, standing, squatting, sleeping, transfers, bed mobility, bathing, reach over head, hygiene/grooming, locomotion level, and caring for others  PARTICIPATION LIMITATIONS: cleaning, laundry, driving, shopping, community activity, occupation, and yard work  PERSONAL FACTORS: 3+ comorbidities: HTN, seizures, CVA, functional movement disorder are also affecting patient's functional outcome.   REHAB POTENTIAL: Excellent  CLINICAL DECISION MAKING: Evolving/moderate complexity  EVALUATION COMPLEXITY: Moderate  PLAN:  PT FREQUENCY: 1-2x/week  PT DURATION: 12 weeks  PLANNED INTERVENTIONS: 97164- PT Re-evaluation, 97750- Physical Performance Testing, 97110-Therapeutic exercises, 97530- Therapeutic activity, 97112- Neuromuscular re-education, 97535- Self Care, [id number]- Manual therapy, [phone]- Gait training, [phone]- Orthotic Initial, [phone]- Orthotic/Prosthetic subsequent, [phone]- Aquatic Therapy, Patient/Family education, Balance training, Stair training, Taping, Dry Needling, Joint mobilization, Joint manipulation, Spinal manipulation, Spinal mobilization, Vestibular training, Visual/preceptual remediation/compensation, DME instructions, Cryotherapy, and Moist heat  PLAN FOR NEXT SESSION:  *** - further gait training focused on heel contact and reduced stop/start pattern  - continued use of 4lb AW as appropriate  - follow-up on ASO brace - Progress to more dynamic gait with stepping in variable directions when not concerned about ankle inversion rolling - continue timed laps using Blaze Pods - continue resisted gait trianing     Amellia Panik, PT, DPT, NCS, CSRS Physical Therapist - [place]  [facility]  11:40 AM 08/31/23

## 2023-08-31 NOTE — Telephone Encounter (Signed)
 Pt contacted via telephone and author left voice mail informing of missed appointment and informed pt of future PT appointment date and time.   Casimiro Needle, PT, DPT, NCS, CSRS

## 2023-09-09 ENCOUNTER — Ambulatory Visit: Payer: MEDICAID | Admitting: Physical Therapy

## 2023-09-09 DIAGNOSIS — R262 Difficulty in walking, not elsewhere classified: Secondary | ICD-10-CM

## 2023-09-09 DIAGNOSIS — R269 Unspecified abnormalities of gait and mobility: Secondary | ICD-10-CM

## 2023-09-09 DIAGNOSIS — M6281 Muscle weakness (generalized): Secondary | ICD-10-CM

## 2023-09-09 DIAGNOSIS — R278 Other lack of coordination: Secondary | ICD-10-CM

## 2023-09-09 NOTE — Therapy (Signed)
 OUTPATIENT PHYSICAL THERAPY TREATMENT   Patient Name: Heather Figueroa MRN: [id number] DOB:March 11, 1970, 53 y.o., female Today's Date: 09/09/2023  PCP: Diedra Lame, MD REFERRING PROVIDER: Diedra Lame, MD  END OF SESSION:   PT End of Session - 09/09/23 0933     Visit Number 8    Number of Visits 24    Date for PT Re-Evaluation 09/14/23    Authorization Type [employer]    Progress Note Due on Visit 10    PT Start Time 0935    PT Stop Time 1014    PT Time Calculation (min) 39 min    Equipment Utilized During Treatment Gait belt    Activity Tolerance Patient tolerated treatment well    Behavior During Therapy [facility] for tasks assessed/performed             Past Medical History:  Diagnosis Date   Anemia    Anxiety    Arthritis    Depression    Headache    Hypertension    Neuromuscular disorder (HCC)    Seizures (HCC)    pt has had 2 seizures in July 2018   Stroke [facility])    possible TIA in July 2018   Past Surgical History:  Procedure Laterality Date   ABDOMINAL HYSTERECTOMY     2002   ANTERIOR CERVICAL DECOMP/DISCECTOMY FUSION N/A 01/26/2017   Procedure: ANTERIOR CERVICAL DECOMPRESSION/DISCECTOMY FUSION, INTERBODY PROSTHESIS, PLATE/SCREWS, POSSIBLE CORPECTOMY CERVICAL FOUR- CERVICAL FIVE, CERVICAL FIVE- CERVICAL SIX, CERVICAL SIX- CERVICAL SEVEN;  Surgeon: Mavis Purchase, MD;  Location: [facility] OR;  Service: Neurosurgery;  Laterality: N/A;  ANTERIOR CERVICAL DECOMPRESSION/DISCECTOMY FUSION, INTERBODY PROSTHESIS, PLATE/SCREWS, POSSIBLE CORPE   APPENDECTOMY     2010   COLONOSCOPY N/A 04/05/2022   Procedure: COLONOSCOPY;  Surgeon: Onita Elspeth Sharper, DO;  Location: [facility] ENDOSCOPY;  Service: Gastroenterology;  Laterality: N/A;   ESOPHAGOGASTRODUODENOSCOPY N/A 04/05/2022   Procedure: ESOPHAGOGASTRODUODENOSCOPY (EGD);  Surgeon: Onita Elspeth Sharper, DO;  Location: [facility] ENDOSCOPY;  Service: Gastroenterology;  Laterality: N/A;   OVARIAN CYST  REMOVAL     TUBAL LIGATION     1998   Patient Active Problem List   Diagnosis Date Noted   Prediabetes 05/24/2023   Seizure (HCC) 05/23/2023   ASD (atrial septal defect): Possible 06/20/2021   Smoking history 06/20/2021   Patent foramen ovale 06/20/2021   Hypokalemia 06/19/2021   Atypical chest pain 06/18/2021   Seizure disorder (HCC) 03/20/2021   Hypertension associated with diabetes (HCC) 03/20/2021   Bipolar disorder, in partial remission, most recent episode depressed (HCC) 03/20/2021   Well woman exam with routine gynecological exam 01/16/2019   Cervical spondylosis with myelopathy and radiculopathy 01/26/2017   Depression 12/18/2015   ONSET DATE: 05/23/2023  REFERRING DIAG:  Diagnosis  R53.1 (ICD-10-CM) - Weakness    THERAPY DIAG:  Difficulty in walking, not elsewhere classified  Muscle weakness (generalized)  Abnormality of gait  Other lack of coordination  Rationale for Evaluation and Treatment: Rehabilitation  SUBJECTIVE:  SUBJECTIVE STATEMENT:  Pt presents wearing R ASO brace. No new changes since last session.  Denies pain to start session.  Denies falls.   PERTINENT HISTORY:  Pt reports that she had Seizure and CVA  roughly 3-4 weeks ago followed by hospital admission. Reports that R side, UE and LE were both affected. States that she now has difficulty with gait, bathing, transfers, and dressing tasks due to increased R side weakness. Due to lack of payor difficulties, she Reports that she received no PT or OT  following recent hospitalizaiton. Neck surgery in 2018, following which she had to re-learn how to walk Reports that she started having weakness in 2017 after they brought me back to life.   PAIN: None today     PRECAUTIONS: Fall  RED FLAGS: None  WEIGHT  BEARING RESTRICTIONS: limited by strength, no official restriction from Cspine surgery 2018  FALLS: Has patient fallen in last 6 months? No  LIVING ENVIRONMENT: Lives with: lives with their son Lives in: House/apartment Stairs: Yes: External: 2 steps; none deep steps that walker can fit  Has following equipment at home: Walker - 2 wheeled  PLOF: Independent with basic ADLs and Independent with household mobility without device  PATIENT GOALS: Get stronger. Walk without walker.   OBJECTIVE:  Note: Objective measures were completed at Evaluation unless otherwise noted.  DIAGNOSTIC FINDINGS:   IMPRESSION: 1. No acute intracranial abnormality. 2. No seizure focus identified. 3. Findings of chronic small vessel ischemia.  COGNITION: Overall cognitive status: Within functional limits for tasks assessed   SENSATION: Light touch: Impaired   COORDINATION: Finger to nose: mild ataxia and decreased ROM due to strength/pain  Ankle knee:   POSTURE: rounded shoulders, forward head, and elevated shoulders  LOWER EXTREMITY ROM:    To be assessed   LOWER EXTREMITY MMT:    MMT Right Eval Left Eval  Hip flexion 3 4-  Hip extension 3+ 4  Hip abduction 4- 4-  Hip adduction 4- 4-  Hip internal rotation    Hip external rotation    Knee flexion 2 4-  Knee extension 3 4  Ankle dorsiflexion 2 4-  Ankle plantarflexion    Ankle inversion    Ankle eversion    (Blank rows = not tested)  BED MOBILITY:  Not tested  TRANSFERS: Sit to stand: SBA  Assistive device utilized: Environmental consultant - 2 wheeled     Stand to sit: SBA  Assistive device utilized: Environmental consultant - 2 wheeled     Chair to chair: SBA  Assistive device utilized: Environmental consultant - 2 wheeled       GAIT: Findings: Gait Characteristics: step to pattern, step through pattern, decreased stride length, Right steppage, Right foot flat, and poor foot clearance- Right, Distance walked: inconsistent step to leading with RLE and then LLE, Assistive device  utilized:Walker - 2 wheeled, Level of assistance: CGA, and Comments: inconstitent step to pattern, inconsistent foot drop drag on the RLE. Noted to have midfoot contact in turns, but toe contact   FUNCTIONAL TESTS:  5 times sit to stand: 35.95 Timed up and go (TUG): 1:34 min with RW  10 meter walk test: 2:11 min with RW. 0.19m/s  Berg Balance Scale: 31  PATIENT SURVEYS:  ABC scale 18%  TREATMENT DATE: 09/09/2023  TA- To improve functional movements patterns for everyday tasks   Gait in/out of clinic using pt's personal RW mod-I with decreased consistency of landing on R LE heel strike at initial contact without cuing.  Educated pt on proper donning/positioning of ASO to help decrease risk of supination/inversion ankle rolling. Educated on wearing a tall sock underneath ASO for skin protection. Educated pt to wear ASO when she is going to be doing more activity, but not to wear all the time. Patient to report back next visit whether the ASO helped her pain or not over the weekend in [place].  Gait training 321ft, no AD, with CGA/occasional light min A for safety/steadying. Pt demonstrating the following gait deviations with therapist providing the described cuing and facilitation for improvement:  - consistent reciprocal stepping pattern throughout - improving R heel strike on initial contact, especially down straight pathways -Challenged to increase pace throughout    Sit<>stands 3x 10reps from green chair, progressed to holding 3KG med ball - cuing to tap hips down between each rep rather than sitting down fully - cuing to avoid knees adducting when lowering - cuing to plant great toes into ground to avoid risk of supinating R ankle in standing  NMR: To facilitate reeducation of movement, balance, posture, coordination, and/or proprioception/kinesthetic sense.  Activity  Description: stair alternating navigation pattern ( pods on steps, pt has to use alternating pattern and hit pod in appropriate stride)  Activity Setting:  home base ( bottom of steps home)  Number of Pods:  5 Cycles/Sets:  2 Duration (Time or Hit Count):  1:30 sec   Gait training using 3 Blaze Pods set on sequence setting, placed approximately 9 ft apart with goal of making it to the next target faster each round  X 4 rounds of 60 sec ea 7 hits, 10 hits, 9 hits, 10 hits respectively each round   TA- To improve functional movements patterns for everyday tasks   Negative split walking on area  - pt challenged to improve speed each round  - times:  18.6 sec, 15 sec, 12.42 sec 10.98 sec, 11.27 sec, 11.78 sec     PATIENT EDUCATION: Education details: POC. HEP. Pt educated throughout session about proper posture and technique with exercises. Improved exercise technique, movement at target joints, use of target muscles after min to mod verbal, visual, tactile cues.  Person educated: Patient Education method: Explanation Education comprehension: verbalized understanding  HOME EXERCISE PROGRAM:   Access Code: [email or web address] URL: [email or web address] Date: 07/28/2023 Prepared by: Connell Kiss  Exercises - Sit to Stand with Hands on Knees  - 1 x daily - 7 x weekly - 2 sets - 10 reps - Standing March with Counter Support  - 1 x daily - 4 x weekly - 3 sets - 10 reps - Heel Toe Raises with Counter Support  - 1 x daily - 4 x weekly - 3 sets - 10 reps - Backward Walking with Counter Support  - 1 x daily - 4 x weekly - 3 sets - 5 reps    GOALS: Goals reviewed with patient? Yes   SHORT TERM GOALS: Target date: 07/20/2023  Patient will be independent in home exercise program to improve strength/mobility for better functional independence with ADLs. Baseline: initiated on 06/29/2023  07/28/2023: updated Goal status: INITIAL   LONG TERM GOALS: Target date:  09/14/2023  Patient will increase ABC score to equal to or greater than   15points   to demonstrate  statistically significant improvement in mobility and quality of life.  Baseline: 18 Goal status: INITIAL  2.  Patient (> 91 years old) will complete five times sit to stand test in < 15 seconds indicating an increased LE strength and improved balance. Baseline: 35.95sec Goal status: INITIAL  3.  Patient will increase Berg Balance score by > 6 points to demonstrate decreased fall risk during functional activities Baseline: to be completed  Goal status: INITIAL  4.  Patient will increase 10 meter walk test to >0.56m/s as to improve gait speed for better community ambulation and to reduce fall risk. Baseline: 0.52m/s Goal status: INITIAL  5.  Patient will reduce timed up and go to <11 seconds to reduce fall risk and demonstrate improved transfer/gait ability. Baseline: 1:34 min with RW Goal status: INITIAL  ASSESSMENT:  CLINICAL IMPRESSION:  Patient arrived with good motivation for completion of pt activities.  PT focussed on improving gait speed and direction changing. Pt does well but still demonstrated NBOS likely causing imbalance with gait. Pt able to increased gait speed when tasked to but still below norms at fast pace. Pt will continue to benefit from skilled physical therapy intervention to address impairments, improve QOL, and attain therapy goals.    OBJECTIVE IMPAIRMENTS: Abnormal gait, decreased activity tolerance, decreased balance, decreased coordination, decreased endurance, decreased knowledge of condition, decreased knowledge of use of DME, decreased mobility, difficulty walking, decreased ROM, decreased strength, impaired sensation, impaired UE functional use, improper body mechanics, and pain.   ACTIVITY LIMITATIONS: lifting, standing, squatting, sleeping, transfers, bed mobility, bathing, reach over head, hygiene/grooming, locomotion level, and caring for  others  PARTICIPATION LIMITATIONS: cleaning, laundry, driving, shopping, community activity, occupation, and yard work  PERSONAL FACTORS: 3+ comorbidities: HTN, seizures, CVA, functional movement disorder are also affecting patient's functional outcome.   REHAB POTENTIAL: Excellent  CLINICAL DECISION MAKING: Evolving/moderate complexity  EVALUATION COMPLEXITY: Moderate  PLAN:  PT FREQUENCY: 1-2x/week  PT DURATION: 12 weeks  PLANNED INTERVENTIONS: 97164- PT Re-evaluation, 97750- Physical Performance Testing, 97110-Therapeutic exercises, 97530- Therapeutic activity, 97112- Neuromuscular re-education, 97535- Self Care, [id number]- Manual therapy, [phone]- Gait training, [phone]- Orthotic Initial, [phone]- Orthotic/Prosthetic subsequent, [phone]- Aquatic Therapy, Patient/Family education, Balance training, Stair training, Taping, Dry Needling, Joint mobilization, Joint manipulation, Spinal manipulation, Spinal mobilization, Vestibular training, Visual/preceptual remediation/compensation, DME instructions, Cryotherapy, and Moist heat  PLAN FOR NEXT SESSION:  - further gait training focused on heel contact and reduced stop/start pattern  - continued use of 4lb AW as appropriate - Progress to more dynamic gait with stepping in variable directions when not concerned about ankle inversion rolling - continue timed laps using Blaze Pods - continue resisted gait trianing - step width training     Note: Portions of this document were prepared using Dragon voice recognition software and although reviewed may contain unintentional dictation errors in syntax, grammar, or spelling.  Lonni KATHEE Gainer PT ,DPT Physical Therapist- [place]  [facility]   9:34 AM 09/09/23

## 2023-09-12 ENCOUNTER — Ambulatory Visit: Payer: MEDICAID | Admitting: Physical Therapy

## 2023-09-12 DIAGNOSIS — M6281 Muscle weakness (generalized): Secondary | ICD-10-CM

## 2023-09-12 DIAGNOSIS — R262 Difficulty in walking, not elsewhere classified: Secondary | ICD-10-CM

## 2023-09-12 DIAGNOSIS — R278 Other lack of coordination: Secondary | ICD-10-CM

## 2023-09-12 DIAGNOSIS — R269 Unspecified abnormalities of gait and mobility: Secondary | ICD-10-CM

## 2023-09-12 NOTE — Therapy (Signed)
 OUTPATIENT PHYSICAL THERAPY TREATMENT/RECERT   Patient Name: Heather Figueroa MRN: [id number] DOB:11/10/1970, 53 y.o., female Today's Date: 09/12/2023  PCP: Diedra Lame, MD REFERRING PROVIDER: Diedra Lame, MD  END OF SESSION:   PT End of Session - 09/12/23 1451     Visit Number 9    Number of Visits 48    Date for PT Re-Evaluation 12/05/23    Authorization Type [employer]    Progress Note Due on Visit 10    PT Start Time 1451    PT Stop Time 1539    PT Time Calculation (min) 48 min    Equipment Utilized During Treatment Gait belt    Activity Tolerance Patient tolerated treatment well    Behavior During Therapy [facility] for tasks assessed/performed              Past Medical History:  Diagnosis Date   Anemia    Anxiety    Arthritis    Depression    Headache    Hypertension    Neuromuscular disorder (HCC)    Seizures (HCC)    pt has had 2 seizures in July 2018   Stroke [facility])    possible TIA in July 2018   Past Surgical History:  Procedure Laterality Date   ABDOMINAL HYSTERECTOMY     2002   ANTERIOR CERVICAL DECOMP/DISCECTOMY FUSION N/A 01/26/2017   Procedure: ANTERIOR CERVICAL DECOMPRESSION/DISCECTOMY FUSION, INTERBODY PROSTHESIS, PLATE/SCREWS, POSSIBLE CORPECTOMY CERVICAL FOUR- CERVICAL FIVE, CERVICAL FIVE- CERVICAL SIX, CERVICAL SIX- CERVICAL SEVEN;  Surgeon: Mavis Purchase, MD;  Location: [facility] OR;  Service: Neurosurgery;  Laterality: N/A;  ANTERIOR CERVICAL DECOMPRESSION/DISCECTOMY FUSION, INTERBODY PROSTHESIS, PLATE/SCREWS, POSSIBLE CORPE   APPENDECTOMY     2010   COLONOSCOPY N/A 04/05/2022   Procedure: COLONOSCOPY;  Surgeon: Onita Elspeth Sharper, DO;  Location: [facility] ENDOSCOPY;  Service: Gastroenterology;  Laterality: N/A;   ESOPHAGOGASTRODUODENOSCOPY N/A 04/05/2022   Procedure: ESOPHAGOGASTRODUODENOSCOPY (EGD);  Surgeon: Onita Elspeth Sharper, DO;  Location: [facility] ENDOSCOPY;  Service: Gastroenterology;  Laterality: N/A;   OVARIAN  CYST REMOVAL     TUBAL LIGATION     1998   Patient Active Problem List   Diagnosis Date Noted   Prediabetes 05/24/2023   Seizure (HCC) 05/23/2023   ASD (atrial septal defect): Possible 06/20/2021   Smoking history 06/20/2021   Patent foramen ovale 06/20/2021   Hypokalemia 06/19/2021   Atypical chest pain 06/18/2021   Seizure disorder (HCC) 03/20/2021   Hypertension associated with diabetes (HCC) 03/20/2021   Bipolar disorder, in partial remission, most recent episode depressed (HCC) 03/20/2021   Well woman exam with routine gynecological exam 01/16/2019   Cervical spondylosis with myelopathy and radiculopathy 01/26/2017   Depression 12/18/2015   ONSET DATE: 05/23/2023  REFERRING DIAG:  Diagnosis  R53.1 (ICD-10-CM) - Weakness    THERAPY DIAG:  Difficulty in walking, not elsewhere classified  Muscle weakness (generalized)  Abnormality of gait  Other lack of coordination  Rationale for Evaluation and Treatment: Rehabilitation  SUBJECTIVE:  SUBJECTIVE STATEMENT:  Pt reports her Aunt passed away this past 09-29-23 and the funeral is on Thursday. Pt reports this Thursday she is leaving for 10 days and then will come back to therapy on Monday, August 4th.  Pt reports no issues with ASO and wore it all day this past 2023-09-29 at a cookout.  Pt reports she feels the ASO is helping her ankle and that she can tell when she is not wearing it. Reports she wears ASO when going out into community, but at home will wear it ~1hr at a time.  Pt reports ASO is helping some with the pain on lateral side of her lower leg with pt reporting she wasn't having pain for ~2-3 weeks, until the past few days where she has been on her feet more.   Pt reports still using RW for community ambulation, unless she has a  buggy at a grocery store, and then doesn't use RW at home unless she is having pain or feeling more imbalanced. Patient reports her goal is to not need the RW any longer.  Denies falls.   PERTINENT HISTORY:  Pt reports that she had Seizure and CVA  roughly 3-4 weeks ago followed by hospital admission. Reports that R side, UE and LE were both affected. States that she now has difficulty with gait, bathing, transfers, and dressing tasks due to increased R side weakness. Due to lack of payor difficulties, she Reports that she received no PT or OT  following recent hospitalizaiton. Neck surgery in 2018, following which she had to re-learn how to walk Reports that she started having weakness in 2017 after they brought me back to life.   PAIN: None today     PRECAUTIONS: Fall  RED FLAGS: None  WEIGHT BEARING RESTRICTIONS: limited by strength, no official restriction from Cspine surgery 2018  FALLS: Has patient fallen in last 6 months? No  LIVING ENVIRONMENT: Lives with: lives with their son Lives in: House/apartment Stairs: Yes: External: 2 steps; none deep steps that walker can fit  Has following equipment at home: Walker - 2 wheeled  PLOF: Independent with basic ADLs and Independent with household mobility without device  PATIENT GOALS: Get stronger. Walk without walker.   OBJECTIVE:  Note: Objective measures were completed at Evaluation unless otherwise noted.  DIAGNOSTIC FINDINGS:   IMPRESSION: 1. No acute intracranial abnormality. 2. No seizure focus identified. 3. Findings of chronic small vessel ischemia.  COGNITION: Overall cognitive status: Within functional limits for tasks assessed   SENSATION: Light touch: Impaired   COORDINATION: Finger to nose: mild ataxia and decreased ROM due to strength/pain  Ankle knee:   POSTURE: rounded shoulders, forward head, and elevated shoulders  LOWER EXTREMITY ROM:    To be assessed   LOWER EXTREMITY MMT:    MMT  Right Eval Left Eval  Hip flexion 3 4-  Hip extension 3+ 4  Hip abduction 4- 4-  Hip adduction 4- 4-  Hip internal rotation    Hip external rotation    Knee flexion 2 4-  Knee extension 3 4  Ankle dorsiflexion 2 4-  Ankle plantarflexion    Ankle inversion    Ankle eversion    (Blank rows = not tested)  BED MOBILITY:  Not tested  TRANSFERS: Sit to stand: SBA  Assistive device utilized: Environmental consultant - 2 wheeled     Stand to sit: SBA  Assistive device utilized: Environmental consultant - 2 wheeled     Chair to chair: [employer]  device utilized: Environmental consultant - 2 wheeled       GAIT: Findings: Gait Characteristics: step to pattern, step through pattern, decreased stride length, Right steppage, Right foot flat, and poor foot clearance- Right, Distance walked: inconsistent step to leading with RLE and then LLE, Assistive device utilized:Walker - 2 wheeled, Level of assistance: CGA, and Comments: inconstitent step to pattern, inconsistent foot drop drag on the RLE. Noted to have midfoot contact in turns, but toe contact   FUNCTIONAL TESTS:  5 times sit to stand: 35.95 Timed up and go (TUG): 1:34 min with RW  10 meter walk test: 2:11 min with RW. 0.60m/s  Berg Balance Scale: 31  PATIENT SURVEYS:  ABC scale 18%                                                                                                                            TREATMENT DATE: 09/12/2023   Therapy session focused on re-assessment of standardized outcome measures and subjective questionnaire to determine pt's progress with therapy thus far in order to submit for re-certification today.  Pt wearing her ASO ankle brace throughout session.   The Activities-Specific Balance Confidence (ABC) Scale 0% 10 20 30  40 50 60 70 80 90 100% No confidence<->completely confident  "How confident are you that you will not lose your balance or become unsteady when you . . .   Date tested 09/12/2023  Walk around the house 90%  2. Walk up or down stairs  70%  3. Bend over and pick up a slipper from in front of a closet floor 40%  4. Reach for a small can off a shelf at eye level 50%  5. Stand on tip toes and reach for something above your head 40%  6. Stand on a chair and reach for something 0%  7. Sweep the floor 70%  8. Walk outside the house to a car parked in the driveway 80%  9. Get into or out of a car 90%  10. Walk across a parking lot to the mall 20%  11. Walk up or down a ramp 80%  12. Walk in a crowded mall where people rapidly walk past you 0%  13. Are bumped into by people as you walk through the mall 0%  14. Step onto or off of an escalator while you are holding onto the railing 10%  15. Step onto or off an escalator while holding onto parcels such that you cannot hold onto the railing 0%  16. Walk outside on icy sidewalks 0%  Total: #/16 40%   Five times Sit to Stand Test (FTSS) "Stand up and sit down as quickly as possible 5 times, keeping your arms folded across your chest."    TIME: 16.14 seconds from green chair, no UE support  Times > 13.6 seconds is associated with increased disability and morbidity (Guralnik, 2000) Times > 15 seconds is predictive of recurrent falls in healthy individuals aged 27 and older (  Buatois, et al., 2008) Normal performance values in community dwelling individuals aged 49 and older (Bohannon, 2006): 60-69 years: 11.4 seconds 70-79 years: 12.6 seconds 80-89 years: 14.8 seconds  MCID: >= 2.3 seconds for Vestibular Disorders (Meretta, 2006)   Participated in Timed Up and Go (TUG): 1st trial: 23.36 seconds 2nd trial: 23.01 seconds  Average: 23.185 seconds using RW with supervision/mod-I  Patient demonstrates high fall risk as indicated by requiring >13.5seconds to complete the TUG.   Participated in Timed Up and Go (TUG): 1st trial: 22.12 seconds 2nd trial: 21.27 seconds  Average: 21.695 seconds, no AD, with CGA progressed to SBA for safety Patient demonstrates high fall risk as  indicated by requiring >13.5seconds to complete the TUG.   10 Meter Walk Test - using RW: Patient instructed to walk 10 meters (32.8 ft) as quickly and as safely as possible at their normal speed x2 and at a fast speed x2. Time measured from 2 meter mark to 8 meter mark to accommodate ramp-up and ramp-down.  Normal speed 1: 0.53 m/s (18.76 seconds) Normal speed 2: 0.50 m/s (19.97 seconds) Average Normal speed: 0.515 m/s using RW Fast speed 1: 0.625 m/s (16.00 seconds) Fast speed 2: 0.748 m/s (13.36 seconds) Average Fast speed: 0.69 m/s using RW Cut off scores: <0.4 m/s = household Ambulator, 0.4-0.8 m/s = limited community Ambulator, >0.8 m/s = community Ambulator, >1.2 m/s = crossing a street, <1.0 = increased fall risk MCID 0.05 m/s (small), 0.13 m/s (moderate), 0.06 m/s (significant)  (ANPTA Core Set of Outcome Measures for Adults with Neurologic Conditions, 2018)   10 Meter Walk Test - without RW: Patient instructed to walk 10 meters (32.8 ft) as quickly and as safely as possible at their normal speed x2 and at a fast speed x2. Time measured from 2 meter mark to 8 meter mark to accommodate ramp-up and ramp-down.  Normal speed 1: 0.726 m/s (13.76 seconds) Normal speed 2: 0.838 m/s (11.92 seconds) Average Normal speed: 0.785 m/s, no AD, with SBA for safety Fast speed 1: 0.85 m/s (11.76 seconds) Fast speed 2: 0.98 m/s (10.16 seconds) Average Fast speed: 0.915 m/s, no AD, with SBA for safety Cut off scores: <0.4 m/s = household Ambulator, 0.4-0.8 m/s = limited community Ambulator, >0.8 m/s = community Ambulator, >1.2 m/s = crossing a street, <1.0 = increased fall risk MCID 0.05 m/s (small), 0.13 m/s (moderate), 0.06 m/s (significant)  (ANPTA Core Set of Outcome Measures for Adults with Neurologic Conditions, 2018)   Patient participated in [place] Balance Test and demonstrates increased fall risk as noted by score of  49/56.  (<36= high risk for falls, close to 100%; 37-45 significant >80%;  46-51 moderate >50%; 52-55 lower >25%).   [facility] PT Assessment - 09/12/23 0001       Berg Balance Test   Sit to Stand Able to stand without using hands and stabilize independently    Standing Unsupported Able to stand safely 2 minutes    Sitting with Back Unsupported but Feet Supported on Floor or Stool Able to sit safely and securely 2 minutes    Stand to Sit Sits safely with minimal use of hands    Transfers Able to transfer safely, definite need of hands    Standing Unsupported with Eyes Closed Able to stand 10 seconds safely    Standing Unsupported with Feet Together Able to place feet together independently and stand 1 minute safely    From Standing, Reach Forward with Outstretched Arm Can reach forward >12 cm  safely (5)    From Standing Position, Pick up Object from Floor Able to pick up shoe, needs supervision    From Standing Position, Turn to Look Behind Over each Shoulder Turn sideways only but maintains balance    Turn 360 Degrees Able to turn 360 degrees safely but slowly    Standing Unsupported, Alternately Place Feet on Step/Stool Able to stand independently and safely and complete 8 steps in 20 seconds    Standing Unsupported, One Foot in Front Able to place foot tandem independently and hold 30 seconds    Standing on One Leg Able to lift leg independently and hold > 10 seconds    Total Score 49         Educated pt on results of assessments and her progress with therapy thus far.    PATIENT EDUCATION: Education details: POC. HEP. Pt educated throughout session about proper posture and technique with exercises. Improved exercise technique, movement at target joints, use of target muscles after min to mod verbal, visual, tactile cues.  Person educated: Patient Education method: Explanation Education comprehension: verbalized understanding  HOME EXERCISE PROGRAM:   Access Code: [email or web address] URL: [email or web address] Date: 07/28/2023 Prepared by: Connell Kiss  Exercises - Sit to Stand with Hands on Knees  - 1 x daily - 7 x weekly - 2 sets - 10 reps - Standing March with Counter Support  - 1 x daily - 4 x weekly - 3 sets - 10 reps - Heel Toe Raises with Counter Support  - 1 x daily - 4 x weekly - 3 sets - 10 reps - Backward Walking with Counter Support  - 1 x daily - 4 x weekly - 3 sets - 5 reps    GOALS: Goals reviewed with patient? Yes   SHORT TERM GOALS: Target date: 10/24/2023  Patient will be independent in home exercise program to improve strength/mobility for better functional independence with ADLs. Baseline: initiated on 06/29/2023  07/28/2023: updated Goal status: INITIAL   LONG TERM GOALS: Target date: 12/05/2023  Patient will increase ABC score by equal to or greater than 15points to demonstrate statistically significant improvement in mobility and quality of life.  Baseline: 18 09/12/2023: 40% Goal status: MET and CONTINUE   2.  Patient (> 9 years old) will complete five times sit to stand test in < 15 seconds indicating an increased LE strength and improved balance. Baseline: 35.95sec 09/12/2023: 16.14 seconds Goal status: IN PROGRESS  3.  Patient will increase Berg Balance score by > 6 points to demonstrate decreased fall risk during functional activities Baseline: to be completed  09/12/2023: 49/56 Goal status: IN PROGRESS  4.  Patient will increase 10 meter walk test to >0.37m/s as to improve gait speed for better community ambulation and to reduce fall risk. Baseline: 0.36m/s 09/12/2023: USING RW: Average Normal speed: 0.515 m/s & Average Fast speed: 0.69 m/s  --- WITHOUT AD: Average Normal speed: 0.785 m/s & Average Fast speed: 0.915 m/s with SBA for safety  Goal status: IN PROGRESS  5.  Patient will reduce timed up and go to <11 seconds to reduce fall risk and demonstrate improved transfer/gait ability. Baseline: 1:34 min with RW 09/12/2023: 23.185 seconds using RW with supervision/mod-I & 21.695 seconds, no  AD, with SBA for safety  Goal status: IN PROGRESS  ASSESSMENT:  CLINICAL IMPRESSION:  Patient arrived with good motivation for completion of pt activities. Therapy session focused on re-assessment of standardized outcome measures and subjective questionnaire to determine pt's progress  with therapy thus far in order to submit for re-certification today. Patient demonstrates significant improvement in her balance with ADLs and functional mobility tasks as noted by improvement from 18% to 40% on ABC Scale; however, still indicative of increased fall risk. Patient demonstrates significant improvement on all outcome measures assessing transfers, standing balance, and gait. Patient able to also participate in assessment of outcome measures without use of AD today and only requires CGA for safety/steadying. Patient reports her goal is to be independent with functional mobility without use of RW and demonstrates progression towards this goal in order to return towards her PLOF. Pt will continue to benefit from skilled physical therapy intervention to address impairments, improve QOL, and attain therapy goals. Patient's condition has the potential to improve in response to therapy. Maximum improvement is yet to be obtained. The anticipated improvement is attainable and reasonable in a generally predictable time.     OBJECTIVE IMPAIRMENTS: Abnormal gait, decreased activity tolerance, decreased balance, decreased coordination, decreased endurance, decreased knowledge of condition, decreased knowledge of use of DME, decreased mobility, difficulty walking, decreased ROM, decreased strength, impaired sensation, impaired UE functional use, improper body mechanics, and pain.   ACTIVITY LIMITATIONS: lifting, standing, squatting, sleeping, transfers, bed mobility, bathing, reach over head, hygiene/grooming, locomotion level, and caring for others  PARTICIPATION LIMITATIONS: cleaning, laundry, driving, shopping, community  activity, occupation, and yard work  PERSONAL FACTORS: 3+ comorbidities: HTN, seizures, CVA, functional movement disorder are also affecting patient's functional outcome.   REHAB POTENTIAL: Excellent  CLINICAL DECISION MAKING: Evolving/moderate complexity  EVALUATION COMPLEXITY: Moderate  PLAN:  PT FREQUENCY: 1-2x/week  PT DURATION: 12 weeks  PLANNED INTERVENTIONS: 97164- PT Re-evaluation, 97750- Physical Performance Testing, 97110-Therapeutic exercises, 97530- Therapeutic activity, 97112- Neuromuscular re-education, 97535- Self Care, [id number]- Manual therapy, [phone]- Gait training, [phone]- Orthotic Initial, [phone]- Orthotic/Prosthetic subsequent, [phone]- Aquatic Therapy, Patient/Family education, Balance training, Stair training, Taping, Dry Needling, Joint mobilization, Joint manipulation, Spinal manipulation, Spinal mobilization, Vestibular training, Visual/preceptual remediation/compensation, DME instructions, Cryotherapy, and Moist heat  PLAN FOR NEXT SESSION:  *Progress Note - just assessed goals at last visit* - further gait training focused on heel contact and reduced stop/start pattern  - continued use of 4lb AW as appropriate - Continue more dynamic gait with stepping in variable directions  - continue timed laps using Blaze Pods - continue resisted gait trianing - step width training  - gait training outside in community setting at hospital   Connell Kiss, PT, DPT, NCS, CSRS Physical Therapist - [facility]  [facility]  3:58 PM 09/12/23

## 2023-09-26 ENCOUNTER — Ambulatory Visit: Payer: MEDICAID | Attending: Family Medicine | Admitting: Physical Therapy

## 2023-09-26 DIAGNOSIS — R278 Other lack of coordination: Secondary | ICD-10-CM | POA: Diagnosis present

## 2023-09-26 DIAGNOSIS — R262 Difficulty in walking, not elsewhere classified: Secondary | ICD-10-CM | POA: Diagnosis present

## 2023-09-26 DIAGNOSIS — M6281 Muscle weakness (generalized): Secondary | ICD-10-CM | POA: Insufficient documentation

## 2023-09-26 DIAGNOSIS — R269 Unspecified abnormalities of gait and mobility: Secondary | ICD-10-CM | POA: Insufficient documentation

## 2023-09-26 NOTE — Therapy (Signed)
 OUTPATIENT PHYSICAL THERAPY TREATMENT Physical Therapy Progress Note   Dates of reporting period  06/22/2023   to   09/26/2023   Patient Name: Heather Figueroa MRN: [id number] DOB:08-Oct-1970, 53 y.o., female Today's Date: 09/26/2023  PCP: Diedra Lame, MD REFERRING PROVIDER: Diedra Lame, MD  END OF SESSION:   PT End of Session - 09/26/23 1147     Visit Number 10    Number of Visits 48    Date for PT Re-Evaluation 12/05/23    Authorization Type 12 visits from 7/21 - 10/2 (8/4 is 2 of 12)    Progress Note Due on Visit 10    PT Start Time 1148    PT Stop Time 1230    PT Time Calculation (min) 42 min    Equipment Utilized During Treatment Gait belt    Activity Tolerance Patient tolerated treatment well    Behavior During Therapy [facility] for tasks assessed/performed               Past Medical History:  Diagnosis Date   Anemia    Anxiety    Arthritis    Depression    Headache    Hypertension    Neuromuscular disorder (HCC)    Seizures (HCC)    pt has had 2 seizures in July 2018   Stroke [facility])    possible TIA in July 2018   Past Surgical History:  Procedure Laterality Date   ABDOMINAL HYSTERECTOMY     2002   ANTERIOR CERVICAL DECOMP/DISCECTOMY FUSION N/A 01/26/2017   Procedure: ANTERIOR CERVICAL DECOMPRESSION/DISCECTOMY FUSION, INTERBODY PROSTHESIS, PLATE/SCREWS, POSSIBLE CORPECTOMY CERVICAL FOUR- CERVICAL FIVE, CERVICAL FIVE- CERVICAL SIX, CERVICAL SIX- CERVICAL SEVEN;  Surgeon: Mavis Purchase, MD;  Location: [facility] OR;  Service: Neurosurgery;  Laterality: N/A;  ANTERIOR CERVICAL DECOMPRESSION/DISCECTOMY FUSION, INTERBODY PROSTHESIS, PLATE/SCREWS, POSSIBLE CORPE   APPENDECTOMY     2010   COLONOSCOPY N/A 04/05/2022   Procedure: COLONOSCOPY;  Surgeon: Onita Elspeth Sharper, DO;  Location: [facility] ENDOSCOPY;  Service: Gastroenterology;  Laterality: N/A;   ESOPHAGOGASTRODUODENOSCOPY N/A 04/05/2022   Procedure: ESOPHAGOGASTRODUODENOSCOPY (EGD);  Surgeon: Onita Elspeth Sharper, DO;  Location: [facility] ENDOSCOPY;  Service: Gastroenterology;  Laterality: N/A;   OVARIAN CYST REMOVAL     TUBAL LIGATION     1998   Patient Active Problem List   Diagnosis Date Noted   Prediabetes 05/24/2023   Seizure (HCC) 05/23/2023   ASD (atrial septal defect): Possible 06/20/2021   Smoking history 06/20/2021   Patent foramen ovale 06/20/2021   Hypokalemia 06/19/2021   Atypical chest pain 06/18/2021   Seizure disorder (HCC) 03/20/2021   Hypertension associated with diabetes (HCC) 03/20/2021   Bipolar disorder, in partial remission, most recent episode depressed (HCC) 03/20/2021   Well woman exam with routine gynecological exam 01/16/2019   Cervical spondylosis with myelopathy and radiculopathy 01/26/2017   Depression 12/18/2015   ONSET DATE: 05/23/2023  REFERRING DIAG:  Diagnosis  R53.1 (ICD-10-CM) - Weakness    THERAPY DIAG:  Difficulty in walking, not elsewhere classified  Muscle weakness (generalized)  Abnormality of gait  Other lack of coordination  Rationale for Evaluation and Treatment: Rehabilitation  SUBJECTIVE:  SUBJECTIVE STATEMENT:  Pt reports her trip to help care for her friend's mother was exhausting. Pt states she was only able to sleep maybe 5hrs the whole 10 days. Pt states she got home Saturday evening and has rested for the past 2 days. Pt states she hasn't had her walker the entire past 10 days and that she did well. Pt states she did well walking into the therapy clinic today. Pt reports she accidentally left her RW in her son's car, which is why she hasn't had it.  Reports no pain today. Pt states she wore her ASO only a little over the past 10 days because she didn't have time to lace it up. Denies falls.  Pt reports her goal is to go back to work. 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 Pt states she is on her feet the entire time she is at work (~6hours), but has been told accommodations will be made for her to return.    Patient reports her goal is to not need the RW any longer.   PERTINENT HISTORY:  Pt reports that she had Seizure and CVA  roughly 3-4 weeks ago followed by hospital admission. Reports that R side, UE and LE were both affected. States that she now has difficulty with gait, bathing, transfers, and dressing tasks due to increased R side weakness. Due to lack of payor difficulties, she Reports that she received no PT or OT  following recent hospitalizaiton. Neck surgery in 2018, following which she had to re-learn how to walk Reports that she started having weakness in 2017 after they brought me back to life.   PAIN: None today     PRECAUTIONS: Fall  RED FLAGS: None  WEIGHT BEARING RESTRICTIONS: limited by strength, no official restriction from Cspine surgery 2018  FALLS: Has patient fallen in last 6 months? No  LIVING ENVIRONMENT: Lives with: lives with their son Lives in: House/apartment Stairs: Yes: External: 2 steps; none deep steps that walker can fit  Has following equipment at home: Walker - 2 wheeled  PLOF: Independent with basic ADLs and Independent with household mobility without device  PATIENT GOALS: Get stronger. Walk without walker.   OBJECTIVE:  Note: Objective measures were completed at Evaluation unless otherwise noted.  DIAGNOSTIC FINDINGS:   IMPRESSION: 1. No acute intracranial abnormality. 2. No seizure focus identified. 3. Findings of chronic small vessel ischemia.  COGNITION: Overall cognitive status: Within functional limits for tasks assessed   SENSATION: Light touch: Impaired   COORDINATION: Finger to nose: mild ataxia and decreased ROM due to  strength/pain  Ankle knee:   POSTURE: rounded shoulders, forward head, and elevated shoulders  LOWER EXTREMITY ROM:    To be assessed   LOWER EXTREMITY MMT:    MMT Right Eval Left Eval  Hip flexion 3 4-  Hip extension 3+ 4  Hip abduction 4- 4-  Hip adduction 4- 4-  Hip internal rotation    Hip external rotation    Knee flexion 2 4-  Knee extension 3 4  Ankle dorsiflexion 2 4-  Ankle plantarflexion    Ankle inversion    Ankle eversion    (Blank rows = not tested)  BED MOBILITY:  Not tested  TRANSFERS: Sit to stand: SBA  Assistive device utilized: Environmental consultant - 2 wheeled     Stand to sit: SBA  Assistive device utilized: Environmental consultant - 2 wheeled     Chair to chair: SBA  Assistive device utilized: Environmental consultant - 2 wheeled       GAIT: Findings: Gait Characteristics: step to pattern, step through pattern, decreased stride length, Right steppage, Right foot flat, and poor foot clearance- Right, Distance walked: inconsistent step to leading with RLE and then LLE, Assistive device utilized:Walker - 2 wheeled, Level of assistance: CGA, and Comments: inconstitent step to pattern, inconsistent foot drop drag on the RLE. Noted to have midfoot contact in turns, but toe contact   FUNCTIONAL TESTS:  5 times sit to stand: 35.95 Timed up and go (TUG): 1:34 min with RW  10 meter walk test: 2:11 min with RW. 0.16m/s  Berg Balance Scale: 31  PATIENT SURVEYS:  ABC scale 18%                                                                                                                            TREATMENT DATE: 09/26/2023  Unless otherwise stated, CGA was provided and gait belt donned in order to ensure pt safety throughout session.  LTGs were re-assessed at last visit and therefore not assessed again at this time. Patient also demonstrating continued progress as she has successfully performed functional mobility the past ~10 days without use of her RW.  Pt wearing her ASO ankle brace throughout  session.  Gait training 452ft, no AD, with CGA/light min assist for steadying and facilitating increasing gait speed. Verbal cuing for increased gait speed Achieves reciprocal pattern throughout Slight R ankle instability during initial contact through stance, but improving  Donned 4lb AW on R LE for the following dynamic gait challenges to promote increased motor recruitment and increase intensity of gait training  Dynamic gait training using agility ladder including the following:  Forward reciprocal pattern 2laps Side stepping 2 laps Backwards step-to pattern progressed to reciprocal pattern 2 laps  Requires CGA/light min A for steadying  Continues to demo mild R ankle instability, but no risk of inversion rolling.  Pt reporting fatigue today from her long trip, benefiting from seated rest breaks throughout session.  Stair navigation training ascending/descending 8 steps (6 height) no HRs with skilled CGA/light min assist for steadying with goal of reciprocal stepping pattern - progressed to adding 5lb dumbbell in R hand during stair navigation with pt able to maintain reciprocal pattern with same assistance level.  Discussed plan to continue performing interventions to help prepare for returning to work including: side stepping and carrying items at a counter as well as continued gait training without use of AD.    PATIENT EDUCATION: Education details: POC. HEP. Pt educated throughout session about proper posture and technique with exercises. Improved exercise technique, movement at target joints, use of target muscles after min to mod verbal, visual, tactile cues.  Person educated: Patient Education method: Explanation Education comprehension: verbalized understanding  HOME EXERCISE PROGRAM:   Access Code: [email or web address] URL: [email or web address] Date: 07/28/2023 Prepared  by: Connell Kiss  Exercises - Sit to Stand with Hands on Knees  - 1 x daily - 7 x weekly  - 2 sets - 10 reps - Standing March with Counter Support  - 1 x daily - 4 x weekly - 3 sets - 10 reps - Heel Toe Raises with Counter Support  - 1 x daily - 4 x weekly - 3 sets - 10 reps - Backward Walking with Counter Support  - 1 x daily - 4 x weekly - 3 sets - 5 reps    GOALS: Goals reviewed with patient? Yes   SHORT TERM GOALS: Target date: 10/24/2023  Patient will be independent in home exercise program to improve strength/mobility for better functional independence with ADLs. Baseline: initiated on 06/29/2023  07/28/2023: updated Goal status: INITIAL   LONG TERM GOALS: Target date: 12/05/2023  Patient will increase ABC score by equal to or greater than 15points to demonstrate statistically significant improvement in mobility and quality of life.  Baseline: 18 09/12/2023: 40% Goal status: MET and CONTINUE   2.  Patient (> 41 years old) will complete five times sit to stand test in < 15 seconds indicating an increased LE strength and improved balance. Baseline: 35.95sec 09/12/2023: 16.14 seconds Goal status: IN PROGRESS  3.  Patient will increase Berg Balance score by > 6 points to demonstrate decreased fall risk during functional activities Baseline: to be completed  09/12/2023: 49/56 Goal status: IN PROGRESS  4.  Patient will increase 10 meter walk test to >0.9m/s as to improve gait speed for better community ambulation and to reduce fall risk. Baseline: 0.21m/s 09/12/2023: USING RW: Average Normal speed: 0.515 m/s & Average Fast speed: 0.69 m/s  --- WITHOUT AD: Average Normal speed: 0.785 m/s & Average Fast speed: 0.915 m/s with SBA for safety  Goal status: IN PROGRESS  5.  Patient will reduce timed up and go to <11 seconds to reduce fall risk and demonstrate improved transfer/gait ability. Baseline: 1:34 min with RW 09/12/2023: 23.185 seconds using RW with supervision/mod-I & 21.695 seconds, no AD, with SBA for safety  Goal status: IN PROGRESS  ASSESSMENT:  CLINICAL  IMPRESSION:  Patient arrived with good motivation for therapy despite reporting fatigue from recent trip. Long term goals were assessed at last visit and therefore not re-assessed at this time; however, as noted in last visit and today patient improving towards her goal of performing independent functional mobility without use of an AD. Patient ambulates into therapy clinic today without use of AD, due to pt reporting she forgot her walker in the back of her son's vehicle, but reports she has done well ambulating without it. Therapy session continued to focus on progressively higher level dynamic gait training without use of UE support to promote R LE NMR and improve balance during functional mobility. Pt demos improving R LE gait mechanics, increasing gait speed, and improving gait endurance. Patient will benefit from dynamic gait training and dynamic balance interventions to prepare for return to work. Pt will continue to benefit from skilled physical therapy intervention to address impairments, improve QOL, and attain therapy goals.     OBJECTIVE IMPAIRMENTS: Abnormal gait, decreased activity tolerance, decreased balance, decreased coordination, decreased endurance, decreased knowledge of condition, decreased knowledge of use of DME, decreased mobility, difficulty walking, decreased ROM, decreased strength, impaired sensation, impaired UE functional use, improper body mechanics, and pain.   ACTIVITY LIMITATIONS: lifting, standing, squatting, sleeping, transfers, bed mobility, bathing, reach over head, hygiene/grooming, locomotion level, and caring  for others  PARTICIPATION LIMITATIONS: cleaning, laundry, driving, shopping, community activity, occupation, and yard work  PERSONAL FACTORS: 3+ comorbidities: HTN, seizures, CVA, functional movement disorder are also affecting patient's functional outcome.   REHAB POTENTIAL: Excellent  CLINICAL DECISION MAKING: Evolving/moderate complexity  EVALUATION  COMPLEXITY: Moderate  PLAN:  PT FREQUENCY: 1-2x/week  PT DURATION: 12 weeks  PLANNED INTERVENTIONS: 97164- PT Re-evaluation, 97750- Physical Performance Testing, 97110-Therapeutic exercises, 97530- Therapeutic activity, 97112- Neuromuscular re-education, 97535- Self Care, [id number]- Manual therapy, [phone]- Gait training, [phone]- Orthotic Initial, [phone]- Orthotic/Prosthetic subsequent, [phone]- Aquatic Therapy, Patient/Family education, Balance training, Stair training, Taping, Dry Needling, Joint mobilization, Joint manipulation, Spinal manipulation, Spinal mobilization, Vestibular training, Visual/preceptual remediation/compensation, DME instructions, Cryotherapy, and Moist heat  PLAN FOR NEXT SESSION:  - progressive dynamic gait training  - continued use of 4lb AW as appropriate - stepping in variable directions  - resisted gait trianing - step width training  - gait training outside in community setting at hospital   Connell Kiss, PT, DPT, NCS, CSRS Physical Therapist - [facility]  [facility]  12:30 PM 09/26/23

## 2023-09-30 ENCOUNTER — Ambulatory Visit: Payer: MEDICAID | Admitting: Physical Therapy

## 2023-09-30 DIAGNOSIS — M6281 Muscle weakness (generalized): Secondary | ICD-10-CM

## 2023-09-30 DIAGNOSIS — R269 Unspecified abnormalities of gait and mobility: Secondary | ICD-10-CM

## 2023-09-30 DIAGNOSIS — R262 Difficulty in walking, not elsewhere classified: Secondary | ICD-10-CM | POA: Diagnosis not present

## 2023-09-30 NOTE — Therapy (Signed)
 OUTPATIENT PHYSICAL THERAPY TREATMENT   Patient Name: Heather Figueroa MRN: [id number] DOB:1970-07-03, 53 y.o., female Today's Date: 09/30/2023  PCP: Diedra Lame, MD REFERRING PROVIDER: Diedra Lame, MD  END OF SESSION:   PT End of Session - 09/30/23 1050     Visit Number 11    Number of Visits 48    Date for PT Re-Evaluation 12/05/23    Authorization Type 12 visits from 7/21 - 10/2 (8/4 is 2 of 12)    Progress Note Due on Visit 20    PT Start Time 1017    PT Stop Time 1057    PT Time Calculation (min) 40 min    Equipment Utilized During Treatment Gait belt    Activity Tolerance Patient tolerated treatment well    Behavior During Therapy [facility] for tasks assessed/performed                Past Medical History:  Diagnosis Date   Anemia    Anxiety    Arthritis    Depression    Headache    Hypertension    Neuromuscular disorder (HCC)    Seizures (HCC)    pt has had 2 seizures in July 2018   Stroke [facility])    possible TIA in July 2018   Past Surgical History:  Procedure Laterality Date   ABDOMINAL HYSTERECTOMY     2002   ANTERIOR CERVICAL DECOMP/DISCECTOMY FUSION N/A 01/26/2017   Procedure: ANTERIOR CERVICAL DECOMPRESSION/DISCECTOMY FUSION, INTERBODY PROSTHESIS, PLATE/SCREWS, POSSIBLE CORPECTOMY CERVICAL FOUR- CERVICAL FIVE, CERVICAL FIVE- CERVICAL SIX, CERVICAL SIX- CERVICAL SEVEN;  Surgeon: Mavis Purchase, MD;  Location: [facility] OR;  Service: Neurosurgery;  Laterality: N/A;  ANTERIOR CERVICAL DECOMPRESSION/DISCECTOMY FUSION, INTERBODY PROSTHESIS, PLATE/SCREWS, POSSIBLE CORPE   APPENDECTOMY     2010   COLONOSCOPY N/A 04/05/2022   Procedure: COLONOSCOPY;  Surgeon: Onita Elspeth Sharper, DO;  Location: [facility] ENDOSCOPY;  Service: Gastroenterology;  Laterality: N/A;   ESOPHAGOGASTRODUODENOSCOPY N/A 04/05/2022   Procedure: ESOPHAGOGASTRODUODENOSCOPY (EGD);  Surgeon: Onita Elspeth Sharper, DO;  Location: [facility] ENDOSCOPY;  Service: Gastroenterology;  Laterality: N/A;    OVARIAN CYST REMOVAL     TUBAL LIGATION     1998   Patient Active Problem List   Diagnosis Date Noted   Prediabetes 05/24/2023   Seizure (HCC) 05/23/2023   ASD (atrial septal defect): Possible 06/20/2021   Smoking history 06/20/2021   Patent foramen ovale 06/20/2021   Hypokalemia 06/19/2021   Atypical chest pain 06/18/2021   Seizure disorder (HCC) 03/20/2021   Hypertension associated with diabetes (HCC) 03/20/2021   Bipolar disorder, in partial remission, most recent episode depressed (HCC) 03/20/2021   Well woman exam with routine gynecological exam 01/16/2019   Cervical spondylosis with myelopathy and radiculopathy 01/26/2017   Depression 12/18/2015   ONSET DATE: 05/23/2023  REFERRING DIAG:  Diagnosis  R53.1 (ICD-10-CM) - Weakness    THERAPY DIAG:  No diagnosis found.  Rationale for Evaluation and Treatment: Rehabilitation  SUBJECTIVE:  SUBJECTIVE STATEMENT:  Pt reports doing well today. Pt denies any recent falls/stumbles since prior session. Pt denies any updates to medications or medical appointment since prior session. Pt reports good compliance with HEP when time permits.     Patient reports her goal is to not need the RW any longer.   PERTINENT HISTORY:  Pt reports that she had Seizure and CVA  roughly 3-4 weeks ago followed by hospital admission. Reports that R side, UE and LE were both affected. States that she now has difficulty with gait, bathing, transfers, and dressing tasks due to increased R side weakness. Due to lack of payor difficulties, she Reports that she received no PT or OT  following recent hospitalizaiton. Neck surgery in 2018, following which she had to re-learn how to walk Reports that she started having weakness in 2017 after they brought me back to life.    PAIN: None today     PRECAUTIONS: Fall  RED FLAGS: None  WEIGHT BEARING RESTRICTIONS: limited by strength, no official restriction from Cspine surgery 2018  FALLS: Has patient fallen in last 6 months? No  LIVING ENVIRONMENT: Lives with: lives with their son Lives in: House/apartment Stairs: Yes: External: 2 steps; none deep steps that walker can fit  Has following equipment at home: Walker - 2 wheeled  PLOF: Independent with basic ADLs and Independent with household mobility without device  PATIENT GOALS: Get stronger. Walk without walker.   OBJECTIVE:  Note: Objective measures were completed at Evaluation unless otherwise noted.  DIAGNOSTIC FINDINGS:   IMPRESSION: 1. No acute intracranial abnormality. 2. No seizure focus identified. 3. Findings of chronic small vessel ischemia.  COGNITION: Overall cognitive status: Within functional limits for tasks assessed   SENSATION: Light touch: Impaired   COORDINATION: Finger to nose: mild ataxia and decreased ROM due to strength/pain  Ankle knee:   POSTURE: rounded shoulders, forward head, and elevated shoulders  LOWER EXTREMITY ROM:    To be assessed   LOWER EXTREMITY MMT:    MMT Right Eval Left Eval  Hip flexion 3 4-  Hip extension 3+ 4  Hip abduction 4- 4-  Hip adduction 4- 4-  Hip internal rotation    Hip external rotation    Knee flexion 2 4-  Knee extension 3 4  Ankle dorsiflexion 2 4-  Ankle plantarflexion    Ankle inversion    Ankle eversion    (Blank rows = not tested)  BED MOBILITY:  Not tested  TRANSFERS: Sit to stand: SBA  Assistive device utilized: Environmental consultant - 2 wheeled     Stand to sit: SBA  Assistive device utilized: Environmental consultant - 2 wheeled     Chair to chair: SBA  Assistive device utilized: Environmental consultant - 2 wheeled       GAIT: Findings: Gait Characteristics: step to pattern, step through pattern, decreased stride length, Right steppage, Right foot flat, and poor foot clearance- Right, Distance  walked: inconsistent step to leading with RLE and then LLE, Assistive device utilized:Walker - 2 wheeled, Level of assistance: CGA, and Comments: inconstitent step to pattern, inconsistent foot drop drag on the RLE. Noted to have midfoot contact in turns, but toe contact   FUNCTIONAL TESTS:  5 times sit to stand: 35.95 Timed up and go (TUG): 1:34 min with RW  10 meter walk test: 2:11 min with RW. 0.89m/s  Berg Balance Scale: 31  PATIENT SURVEYS:  ABC scale 18%  TREATMENT DATE: 09/30/2023  Unless otherwise stated, CGA was provided and gait belt donned in order to ensure pt safety throughout session.   Pt wearing her ASO ankle brace throughout session.  Donned 4lb AW on R LE for the following dynamic gait challenges to promote increased motor recruitment and increase intensity of gait training  Gait training 7 min with 4# AW, changing from forward to retro gait intermittently, slow gait speed after transitioning form retro to forward gair    Forward and retr gait through cones with AW  X 4 laps   TA- To improve functional movements patterns for everyday tasks   *1 lap = 1 time each way to return to start point  Side step and forward/retro through cones x 4 laps ( alternating using sides)   Obstacle course with handweight x 3 laps   Obstacle course with carrying tray to simulate carrying biscuits in job x 3 laps through   TA- To improve functional movements patterns for everyday tasks   Stair training forward up 4 steps then retro down stairs 2 sets of 3 laps, no UE ascending throughout. Progressed to no UE descending backwards.   PATIENT EDUCATION: Education details: POC. HEP. Pt educated throughout session about proper posture and technique with exercises. Improved exercise technique, movement at target joints, use of target muscles after min to mod verbal,  visual, tactile cues.  Person educated: Patient Education method: Explanation Education comprehension: verbalized understanding  HOME EXERCISE PROGRAM:   Access Code: [email or web address] URL: [email or web address] Date: 07/28/2023 Prepared by: Connell Kiss  Exercises - Sit to Stand with Hands on Knees  - 1 x daily - 7 x weekly - 2 sets - 10 reps - Standing March with Counter Support  - 1 x daily - 4 x weekly - 3 sets - 10 reps - Heel Toe Raises with Counter Support  - 1 x daily - 4 x weekly - 3 sets - 10 reps - Backward Walking with Counter Support  - 1 x daily - 4 x weekly - 3 sets - 5 reps    GOALS: Goals reviewed with patient? Yes   SHORT TERM GOALS: Target date: 10/24/2023  Patient will be independent in home exercise program to improve strength/mobility for better functional independence with ADLs. Baseline: initiated on 06/29/2023  07/28/2023: updated Goal status: INITIAL   LONG TERM GOALS: Target date: 12/05/2023  Patient will increase ABC score by equal to or greater than 15points to demonstrate statistically significant improvement in mobility and quality of life.  Baseline: 18 09/12/2023: 40% Goal status: MET and CONTINUE   2.  Patient (> 58 years old) will complete five times sit to stand test in < 15 seconds indicating an increased LE strength and improved balance. Baseline: 35.95sec 09/12/2023: 16.14 seconds Goal status: IN PROGRESS  3.  Patient will increase Berg Balance score by > 6 points to demonstrate decreased fall risk during functional activities Baseline: to be completed  09/12/2023: 49/56 Goal status: IN PROGRESS  4.  Patient will increase 10 meter walk test to >0.51m/s as to improve gait speed for better community ambulation and to reduce fall risk. Baseline: 0.5m/s 09/12/2023: USING RW: Average Normal speed: 0.515 m/s & Average Fast speed: 0.69 m/s  --- WITHOUT AD: Average Normal speed: 0.785 m/s & Average Fast speed: 0.915 m/s with SBA for  safety  Goal status: IN PROGRESS  5.  Patient will reduce timed up and go to <11 seconds to reduce fall risk and demonstrate improved transfer/gait  ability. Baseline: 1:34 min with RW 09/12/2023: 23.185 seconds using RW with supervision/mod-I & 21.695 seconds, no AD, with SBA for safety  Goal status: IN PROGRESS  ASSESSMENT:  CLINICAL IMPRESSION:   Patient presents with good motivation for completion of physical therapy activities.  Patient presents without rolling walker at this date.  Completed all interventions without assistive device.  Patient challenged with various forms of gait including anterior, retro-, and stair navigation all demonstrating good balance but still occasional instability on the right lower extremity and ankle region. Pt will continue to benefit from skilled physical therapy intervention to address impairments, improve QOL, and attain therapy goals.      OBJECTIVE IMPAIRMENTS: Abnormal gait, decreased activity tolerance, decreased balance, decreased coordination, decreased endurance, decreased knowledge of condition, decreased knowledge of use of DME, decreased mobility, difficulty walking, decreased ROM, decreased strength, impaired sensation, impaired UE functional use, improper body mechanics, and pain.   ACTIVITY LIMITATIONS: lifting, standing, squatting, sleeping, transfers, bed mobility, bathing, reach over head, hygiene/grooming, locomotion level, and caring for others  PARTICIPATION LIMITATIONS: cleaning, laundry, driving, shopping, community activity, occupation, and yard work  PERSONAL FACTORS: 3+ comorbidities: HTN, seizures, CVA, functional movement disorder are also affecting patient's functional outcome.   REHAB POTENTIAL: Excellent  CLINICAL DECISION MAKING: Evolving/moderate complexity  EVALUATION COMPLEXITY: Moderate  PLAN:  PT FREQUENCY: 1-2x/week  PT DURATION: 12 weeks  PLANNED INTERVENTIONS: 97164- PT Re-evaluation, 97750- Physical  Performance Testing, 97110-Therapeutic exercises, 97530- Therapeutic activity, 97112- Neuromuscular re-education, 97535- Self Care, [id number]- Manual therapy, [phone]- Gait training, [phone]- Orthotic Initial, [phone]- Orthotic/Prosthetic subsequent, [phone]- Aquatic Therapy, Patient/Family education, Balance training, Stair training, Taping, Dry Needling, Joint mobilization, Joint manipulation, Spinal manipulation, Spinal mobilization, Vestibular training, Visual/preceptual remediation/compensation, DME instructions, Cryotherapy, and Moist heat  PLAN FOR NEXT SESSION:  - progressive dynamic gait training  - continued use of 4lb AW as appropriate - stepping in variable directions  - resisted gait trianing - step width training  - gait training outside in community setting at hospital   Note: Portions of this document were prepared using Dragon voice recognition software and although reviewed may contain unintentional dictation errors in syntax, grammar, or spelling.  Lonni KATHEE Gainer PT ,DPT Physical Therapist- [facility]   10:50 AM 09/30/23

## 2023-10-04 ENCOUNTER — Ambulatory Visit: Payer: MEDICAID | Admitting: Physical Therapy

## 2023-10-04 DIAGNOSIS — R269 Unspecified abnormalities of gait and mobility: Secondary | ICD-10-CM

## 2023-10-04 DIAGNOSIS — R262 Difficulty in walking, not elsewhere classified: Secondary | ICD-10-CM | POA: Diagnosis not present

## 2023-10-04 DIAGNOSIS — M6281 Muscle weakness (generalized): Secondary | ICD-10-CM

## 2023-10-04 DIAGNOSIS — R278 Other lack of coordination: Secondary | ICD-10-CM

## 2023-10-04 NOTE — Therapy (Signed)
 OUTPATIENT PHYSICAL THERAPY TREATMENT   Patient Name: Heather Figueroa MRN: [id number] DOB:August 28, 1970, 53 y.o., female Today's Date: 10/04/2023  PCP: Diedra Lame, MD REFERRING PROVIDER: Diedra Lame, MD  END OF SESSION:   PT End of Session - 10/04/23 1318     Visit Number 12    Number of Visits 48    Date for PT Re-Evaluation 12/05/23    Authorization Type 12 visits from 7/21 - 10/2 (8/12 is 4 of 12)    Progress Note Due on Visit 20    PT Start Time 1319    PT Stop Time 1400    PT Time Calculation (min) 41 min    Equipment Utilized During Treatment Gait belt    Activity Tolerance Patient tolerated treatment well    Behavior During Therapy [facility] for tasks assessed/performed                 Past Medical History:  Diagnosis Date   Anemia    Anxiety    Arthritis    Depression    Headache    Hypertension    Neuromuscular disorder (HCC)    Seizures (HCC)    pt has had 2 seizures in July 2018   Stroke [facility])    possible TIA in July 2018   Past Surgical History:  Procedure Laterality Date   ABDOMINAL HYSTERECTOMY     2002   ANTERIOR CERVICAL DECOMP/DISCECTOMY FUSION N/A 01/26/2017   Procedure: ANTERIOR CERVICAL DECOMPRESSION/DISCECTOMY FUSION, INTERBODY PROSTHESIS, PLATE/SCREWS, POSSIBLE CORPECTOMY CERVICAL FOUR- CERVICAL FIVE, CERVICAL FIVE- CERVICAL SIX, CERVICAL SIX- CERVICAL SEVEN;  Surgeon: Mavis Purchase, MD;  Location: [facility] OR;  Service: Neurosurgery;  Laterality: N/A;  ANTERIOR CERVICAL DECOMPRESSION/DISCECTOMY FUSION, INTERBODY PROSTHESIS, PLATE/SCREWS, POSSIBLE CORPE   APPENDECTOMY     2010   COLONOSCOPY N/A 04/05/2022   Procedure: COLONOSCOPY;  Surgeon: Onita Elspeth Sharper, DO;  Location: [facility] ENDOSCOPY;  Service: Gastroenterology;  Laterality: N/A;   ESOPHAGOGASTRODUODENOSCOPY N/A 04/05/2022   Procedure: ESOPHAGOGASTRODUODENOSCOPY (EGD);  Surgeon: Onita Elspeth Sharper, DO;  Location: [facility] ENDOSCOPY;  Service: Gastroenterology;  Laterality: N/A;    OVARIAN CYST REMOVAL     TUBAL LIGATION     1998   Patient Active Problem List   Diagnosis Date Noted   Prediabetes 05/24/2023   Seizure (HCC) 05/23/2023   ASD (atrial septal defect): Possible 06/20/2021   Smoking history 06/20/2021   Patent foramen ovale 06/20/2021   Hypokalemia 06/19/2021   Atypical chest pain 06/18/2021   Seizure disorder (HCC) 03/20/2021   Hypertension associated with diabetes (HCC) 03/20/2021   Bipolar disorder, in partial remission, most recent episode depressed (HCC) 03/20/2021   Well woman exam with routine gynecological exam 01/16/2019   Cervical spondylosis with myelopathy and radiculopathy 01/26/2017   Depression 12/18/2015   ONSET DATE: 05/23/2023  REFERRING DIAG:  Diagnosis  R53.1 (ICD-10-CM) - Weakness    THERAPY DIAG:  Difficulty in walking, not elsewhere classified  Other lack of coordination  Muscle weakness (generalized)  Abnormality of gait  Rationale for Evaluation and Treatment: Rehabilitation  SUBJECTIVE:  SUBJECTIVE STATEMENT: Patient reports that she continues to not utilize RW. Patient states that she has felt fine without the RW at the grocery store. Patient stated that she felt like she went to the park with family and it was too much secondary to endurance deficits rather than balance. Patient reports that she is still experiencing lots of fatigue after her trip to [place], noting it takes about 2 weeks to return to baseline; overall, doing well. Pt denies any recent falls/stumbles since prior session. Pt denies any updates to medications or medical appointment since prior session.     Patient reports her goal is to improve her tolerance to be on the RLE more throughout the day.    PERTINENT HISTORY:  Pt reports that she had Seizure and CVA   roughly 3-4 weeks ago followed by hospital admission. Reports that R side, UE and LE were both affected. States that she now has difficulty with gait, bathing, transfers, and dressing tasks due to increased R side weakness. Due to lack of payor difficulties, she Reports that she received no PT or OT  following recent hospitalizaiton. Neck surgery in 2018, following which she had to re-learn how to walk Reports that she started having weakness in 2017 after they brought me back to life.   PAIN: None today     PRECAUTIONS: Fall  RED FLAGS: None  WEIGHT BEARING RESTRICTIONS: limited by strength, no official restriction from Cspine surgery 2018  FALLS: Has patient fallen in last 6 months? No  LIVING ENVIRONMENT: Lives with: lives with their son Lives in: House/apartment Stairs: Yes: External: 2 steps; none deep steps that walker can fit  Has following equipment at home: Walker - 2 wheeled  PLOF: Independent with basic ADLs and Independent with household mobility without device  PATIENT GOALS: Get stronger. Walk without walker.   OBJECTIVE:  Note: Objective measures were completed at Evaluation unless otherwise noted.  DIAGNOSTIC FINDINGS:   IMPRESSION: 1. No acute intracranial abnormality. 2. No seizure focus identified. 3. Findings of chronic small vessel ischemia.  COGNITION: Overall cognitive status: Within functional limits for tasks assessed   SENSATION: Light touch: Impaired   COORDINATION: Finger to nose: mild ataxia and decreased ROM due to strength/pain  Ankle knee:   POSTURE: rounded shoulders, forward head, and elevated shoulders  LOWER EXTREMITY ROM:    To be assessed   LOWER EXTREMITY MMT:    MMT Right Eval Left Eval  Hip flexion 3 4-  Hip extension 3+ 4  Hip abduction 4- 4-  Hip adduction 4- 4-  Hip internal rotation    Hip external rotation    Knee flexion 2 4-  Knee extension 3 4  Ankle dorsiflexion 2 4-  Ankle plantarflexion    Ankle  inversion    Ankle eversion    (Blank rows = not tested)  BED MOBILITY:  Not tested  TRANSFERS: Sit to stand: SBA  Assistive device utilized: Environmental consultant - 2 wheeled     Stand to sit: SBA  Assistive device utilized: Environmental consultant - 2 wheeled     Chair to chair: SBA  Assistive device utilized: Environmental consultant - 2 wheeled       GAIT: Findings: Gait Characteristics: step to pattern, step through pattern, decreased stride length, Right steppage, Right foot flat, and poor foot clearance- Right, Distance walked: inconsistent step to leading with RLE and then LLE, Assistive device utilized:Walker - 2 wheeled, Level of assistance: CGA, and Comments: inconstitent step to pattern, inconsistent foot drop drag on  the RLE. Noted to have midfoot contact in turns, but toe contact   FUNCTIONAL TESTS:  5 times sit to stand: 35.95 Timed up and go (TUG): 1:34 min with RW  10 meter walk test: 2:11 min with RW. 0.58m/s  Berg Balance Scale: 31  PATIENT SURVEYS:  ABC scale 18%                                                                                                                            TREATMENT DATE: 10/04/2023   Unless otherwise stated, CGA was provided and gait belt donned in order to ensure pt safety throughout session.  Pt wearing her ASO ankle brace throughout session.  Gait ~450' without AD, SBA-CGA Trendelenberg noted in RLE resulting in hip drop, knee hyperextension, forefoot strike Verbal cueing for increased gait speed in last ~150' of gait trial Improved gait mechanics with increased speed; decreased hip drop & knee hyperextension, increased heel strike   Blazepods Stepping Activities, CGA 2x 2:30, random sequencing 5 pods placed laterally, posteriolaterally, & posterior; promote dynamic stepping strategies Of note, posterior pod, patient instructed to either turn & face pod or complete step backwards towards pod 3 on floor laterally & posterior, 3 on countertop/table laterally & posterior;  promote dynamic stepping strategies & reaching outside BOS Patient instructed to complete step backwards to posterior pod; turn to tap posterior pod at table surface  Patient reporting increased difficulty with lateral stepping with RLE, feeling like she has to take more steps with RLE   Patient educated on POC for future sessions, plan to adjust HEP. Patient expressing concerns regarding decreased sensation in BLEs.    PATIENT EDUCATION: Education details: POC. HEP. Pt educated throughout session about proper posture and technique with exercises. Improved exercise technique, movement at target joints, use of target muscles after min to mod verbal, visual, tactile cues.  Person educated: Patient Education method: Explanation Education comprehension: verbalized understanding  HOME EXERCISE PROGRAM:   Access Code: [email or web address] URL: [email or web address] Date: 07/28/2023 Prepared by: Connell Kiss  Exercises - Sit to Stand with Hands on Knees  - 1 x daily - 7 x weekly - 2 sets - 10 reps - Standing March with Counter Support  - 1 x daily - 4 x weekly - 3 sets - 10 reps - Heel Toe Raises with Counter Support  - 1 x daily - 4 x weekly - 3 sets - 10 reps - Backward Walking with Counter Support  - 1 x daily - 4 x weekly - 3 sets - 5 reps    GOALS: Goals reviewed with patient? Yes   SHORT TERM GOALS: Target date: 10/24/2023  Patient will be independent in home exercise program to improve strength/mobility for better functional independence with ADLs. Baseline: initiated on 06/29/2023  07/28/2023: updated Goal status: INITIAL   LONG TERM GOALS: Target date: 12/05/2023  Patient will increase ABC score by equal to or greater than 15points to demonstrate statistically significant improvement in  mobility and quality of life.  Baseline: 18 09/12/2023: 40% Goal status: MET and CONTINUE   2.  Patient (> 14 years old) will complete five times sit to stand test in < 15 seconds  indicating an increased LE strength and improved balance. Baseline: 35.95sec 09/12/2023: 16.14 seconds Goal status: IN PROGRESS  3.  Patient will increase Berg Balance score by > 6 points to demonstrate decreased fall risk during functional activities Baseline: to be completed  09/12/2023: 49/56 Goal status: IN PROGRESS  4.  Patient will increase 10 meter walk test to >0.28m/s as to improve gait speed for better community ambulation and to reduce fall risk. Baseline: 0.29m/s 09/12/2023: USING RW: Average Normal speed: 0.515 m/s & Average Fast speed: 0.69 m/s  --- WITHOUT AD: Average Normal speed: 0.785 m/s & Average Fast speed: 0.915 m/s with SBA for safety  Goal status: IN PROGRESS  5.  Patient will reduce timed up and go to <11 seconds to reduce fall risk and demonstrate improved transfer/gait ability. Baseline: 1:34 min with RW 09/12/2023: 23.185 seconds using RW with supervision/mod-I & 21.695 seconds, no AD, with SBA for safety  Goal status: IN PROGRESS  ASSESSMENT:  CLINICAL IMPRESSION:   Patient continuing to present with good motivation for completion of physical therapy activities.  Completed all interventions without assistive device again this date.  Patient completing gait & demonstrating knee hyperextension, Trendelenberg, & decreased heel strike with comfortable gait speed. Patient participating in lateral & posterior stepping activity in order to challenge balance. Patient educated on POC to include change in HEP at next session. Pt will continue to benefit from skilled physical therapy intervention to address impairments, improve QOL, and attain therapy goals.      OBJECTIVE IMPAIRMENTS: Abnormal gait, decreased activity tolerance, decreased balance, decreased coordination, decreased endurance, decreased knowledge of condition, decreased knowledge of use of DME, decreased mobility, difficulty walking, decreased ROM, decreased strength, impaired sensation, impaired UE  functional use, improper body mechanics, and pain.   ACTIVITY LIMITATIONS: lifting, standing, squatting, sleeping, transfers, bed mobility, bathing, reach over head, hygiene/grooming, locomotion level, and caring for others  PARTICIPATION LIMITATIONS: cleaning, laundry, driving, shopping, community activity, occupation, and yard work  PERSONAL FACTORS: 3+ comorbidities: HTN, seizures, CVA, functional movement disorder are also affecting patient's functional outcome.   REHAB POTENTIAL: Excellent  CLINICAL DECISION MAKING: Evolving/moderate complexity  EVALUATION COMPLEXITY: Moderate  PLAN:  PT FREQUENCY: 1-2x/week  PT DURATION: 12 weeks  PLANNED INTERVENTIONS: 97164- PT Re-evaluation, 97750- Physical Performance Testing, 97110-Therapeutic exercises, 97530- Therapeutic activity, 97112- Neuromuscular re-education, 97535- Self Care, [id number]- Manual therapy, [phone]- Gait training, [phone]- Orthotic Initial, [phone]- Orthotic/Prosthetic subsequent, [phone]- Aquatic Therapy, Patient/Family education, Balance training, Stair training, Taping, Dry Needling, Joint mobilization, Joint manipulation, Spinal manipulation, Spinal mobilization, Vestibular training, Visual/preceptual remediation/compensation, DME instructions, Cryotherapy, and Moist heat  PLAN FOR NEXT SESSION:  - Continue progressive dynamic gait training  - continued use of 4lb AW as appropriate - stepping in variable directions  - resisted gait training - step width training  - use of agility ladder for increased lateral step length - gait training outside in community setting at hospital    Chiquita Silvan, SPT Physical Therapy Student - [facility]  [facility]    3:27 PM 10/04/23

## 2023-10-07 ENCOUNTER — Ambulatory Visit: Payer: MEDICAID | Admitting: Physical Therapy

## 2023-10-12 ENCOUNTER — Ambulatory Visit: Payer: MEDICAID | Admitting: Physical Therapy

## 2023-10-12 DIAGNOSIS — R269 Unspecified abnormalities of gait and mobility: Secondary | ICD-10-CM

## 2023-10-12 DIAGNOSIS — M6281 Muscle weakness (generalized): Secondary | ICD-10-CM

## 2023-10-12 DIAGNOSIS — R262 Difficulty in walking, not elsewhere classified: Secondary | ICD-10-CM

## 2023-10-12 DIAGNOSIS — R278 Other lack of coordination: Secondary | ICD-10-CM

## 2023-10-12 NOTE — Therapy (Signed)
 OUTPATIENT PHYSICAL THERAPY TREATMENT   Patient Name: Heather Figueroa MRN: [id number] DOB:[birth year], 53 y.o., female Today's Date: 10/12/2023  PCP: Diedra Lame, MD REFERRING PROVIDER: Diedra Lame, MD  END OF SESSION:   PT End of Session - 10/12/23 1405     Visit Number 13    Number of Visits 48    Date for PT Re-Evaluation 12/05/23    Authorization Type 12 visits from 7/21 - 10/2 (8/12 is 4 of 12)    Progress Note Due on Visit 20    PT Start Time 1406    PT Stop Time 1445    PT Time Calculation (min) 39 min    Equipment Utilized During Treatment Gait belt    Activity Tolerance Patient tolerated treatment well    Behavior During Therapy [facility] for tasks assessed/performed                  Past Medical History:  Diagnosis Date   Anemia    Anxiety    Arthritis    Depression    Headache    Hypertension    Neuromuscular disorder (HCC)    Seizures (HCC)    pt has had 2 seizures in July 2018   Stroke [facility])    possible TIA in July 2018   Past Surgical History:  Procedure Laterality Date   ABDOMINAL HYSTERECTOMY     2002   ANTERIOR CERVICAL DECOMP/DISCECTOMY FUSION N/A 01/26/2017   Procedure: ANTERIOR CERVICAL DECOMPRESSION/DISCECTOMY FUSION, INTERBODY PROSTHESIS, PLATE/SCREWS, POSSIBLE CORPECTOMY CERVICAL FOUR- CERVICAL FIVE, CERVICAL FIVE- CERVICAL SIX, CERVICAL SIX- CERVICAL SEVEN;  Surgeon: Mavis Purchase, MD;  Location: [facility] OR;  Service: Neurosurgery;  Laterality: N/A;  ANTERIOR CERVICAL DECOMPRESSION/DISCECTOMY FUSION, INTERBODY PROSTHESIS, PLATE/SCREWS, POSSIBLE CORPE   APPENDECTOMY     2010   COLONOSCOPY N/A 04/05/2022   Procedure: COLONOSCOPY;  Surgeon: Onita Elspeth Sharper, DO;  Location: [facility] ENDOSCOPY;  Service: Gastroenterology;  Laterality: N/A;   ESOPHAGOGASTRODUODENOSCOPY N/A 04/05/2022   Procedure: ESOPHAGOGASTRODUODENOSCOPY (EGD);  Surgeon: Onita Elspeth Sharper, DO;  Location: [facility] ENDOSCOPY;  Service: Gastroenterology;  Laterality:  N/A;   OVARIAN CYST REMOVAL     TUBAL LIGATION     1998   Patient Active Problem List   Diagnosis Date Noted   Prediabetes 05/24/2023   Seizure (HCC) 05/23/2023   ASD (atrial septal defect): Possible 06/20/2021   Smoking history 06/20/2021   Patent foramen ovale 06/20/2021   Hypokalemia 06/19/2021   Atypical chest pain 06/18/2021   Seizure disorder (HCC) 03/20/2021   Hypertension associated with diabetes (HCC) 03/20/2021   Bipolar disorder, in partial remission, most recent episode depressed (HCC) 03/20/2021   Well woman exam with routine gynecological exam 01/16/2019   Cervical spondylosis with myelopathy and radiculopathy 01/26/2017   Depression 12/18/2015   ONSET DATE: 05/23/2023  REFERRING DIAG:  Diagnosis  R53.1 (ICD-10-CM) - Weakness    THERAPY DIAG:  Difficulty in walking, not elsewhere classified  Other lack of coordination  Muscle weakness (generalized)  Abnormality of gait  Rationale for Evaluation and Treatment: Rehabilitation  SUBJECTIVE:  SUBJECTIVE STATEMENT:  Pt states that she felt like she was tired after last session, denies soreness. Pt denies any updates to medications or medical appointment since prior session. Pt states that she did have one non-injurious fall after tripping over her grandkid's foot. Pt states that she has been walking to & from the mailbox daily. Pt states that she went to the grocery store and her foot was a little swollen from being on it all day. Pt stated that she would like to return to work next week but is awaiting clearance from her doctor.      Patient reports her goal is to improve her tolerance to be on the RLE more throughout the day.    PERTINENT HISTORY:  Pt reports that she had Seizure and CVA  roughly 3-4 weeks ago followed by  hospital admission. Reports that R side, UE and LE were both affected. States that she now has difficulty with gait, bathing, transfers, and dressing tasks due to increased R side weakness. Due to lack of payor difficulties, she Reports that she received no PT or OT  following recent hospitalizaiton. Neck surgery in 2018, following which she had to re-learn how to walk Reports that she started having weakness in 2017 after they brought me back to life.   PAIN: None today     PRECAUTIONS: Fall  RED FLAGS: None  WEIGHT BEARING RESTRICTIONS: limited by strength, no official restriction from Cspine surgery 2018  FALLS: Has patient fallen in last 6 months? No  LIVING ENVIRONMENT: Lives with: lives with their son Lives in: House/apartment Stairs: Yes: External: 2 steps; none deep steps that walker can fit  Has following equipment at home: Walker - 2 wheeled  PLOF: Independent with basic ADLs and Independent with household mobility without device  PATIENT GOALS: Get stronger. Walk without walker.   OBJECTIVE:  Note: Objective measures were completed at Evaluation unless otherwise noted.  DIAGNOSTIC FINDINGS:   IMPRESSION: 1. No acute intracranial abnormality. 2. No seizure focus identified. 3. Findings of chronic small vessel ischemia.  COGNITION: Overall cognitive status: Within functional limits for tasks assessed   SENSATION: Light touch: Impaired   COORDINATION: Finger to nose: mild ataxia and decreased ROM due to strength/pain  Ankle knee:   POSTURE: rounded shoulders, forward head, and elevated shoulders  LOWER EXTREMITY ROM:    To be assessed   LOWER EXTREMITY MMT:    MMT Right Eval Left Eval  Hip flexion 3 4-  Hip extension 3+ 4  Hip abduction 4- 4-  Hip adduction 4- 4-  Hip internal rotation    Hip external rotation    Knee flexion 2 4-  Knee extension 3 4  Ankle dorsiflexion 2 4-  Ankle plantarflexion    Ankle inversion    Ankle eversion     (Blank rows = not tested)  BED MOBILITY:  Not tested  TRANSFERS: Sit to stand: SBA  Assistive device utilized: Environmental consultant - 2 wheeled     Stand to sit: SBA  Assistive device utilized: Environmental consultant - 2 wheeled     Chair to chair: SBA  Assistive device utilized: Environmental consultant - 2 wheeled       GAIT: Findings: Gait Characteristics: step to pattern, step through pattern, decreased stride length, Right steppage, Right foot flat, and poor foot clearance- Right, Distance walked: inconsistent step to leading with RLE and then LLE, Assistive device utilized:Walker - 2 wheeled, Level of assistance: CGA, and Comments: inconstitent step to pattern, inconsistent foot drop drag  on the RLE. Noted to have midfoot contact in turns, but toe contact   FUNCTIONAL TESTS:  5 times sit to stand: 35.95 Timed up and go (TUG): 1:34 min with RW  10 meter walk test: 2:11 min with RW. 0.65m/s  Berg Balance Scale: 31  PATIENT SURVEYS:  ABC scale 18%                                                                                                                            TREATMENT DATE: 10/12/2023  Unless otherwise stated, CGA was provided and gait belt donned in order to ensure pt safety throughout session.  Pt wearing her ASO ankle brace throughout session.  Gait >400' without AD, SBA-CGA, completed over uneven paved surfaces to include ascending and descending incline Continue to note trendelenberg noted in RLE resulting in hip drop, knee hyperextension; improved heelstrike noted this date Pt reported that she feels like she was walking on tiptoes when ascending incline Verbal cueing for increased gait speed at beginning of gait trial Patient demonstrating decreased confidence with turning without advanced notice Patient reported that she found turning to be more difficult when she was not able to plan her steps.  Continued to note improved gait mechanics with increased speed; decreased hip drop & knee  hyperextension Dynamic gait training to include forward, backward, lateral walking with turns bilaterally  Patient randomly instructed to complete turning 180 deg or 90 deg in either direction, walk forward or backward; patient without significant LOB throughout.  Patient demonstrating increased instability when turning 180 deg to L side as evidenced by decreased speed of activity, need for additional stepping strategies to maintain balance.  Patient able to improve turning bilaterally as evidenced by increased speed and patient report of increased confidence with activity.  Patient able to progress to completing 90 deg turns to either side, then to complete lateral stepping.  Patient with increased step length laterally R > L Progressed activity by adding visual scanning, reading letters on sticky notes in hallway.     Patient educated on POC for future sessions, to include focus on functional activities like lateral and posterior stepping to prepare for return to work.    PATIENT EDUCATION: Education details: POC. HEP. Pt educated throughout session about proper posture and technique with exercises. Improved exercise technique, movement at target joints, use of target muscles after min to mod verbal, visual, tactile cues.  Person educated: Patient Education method: Explanation Education comprehension: verbalized understanding  HOME EXERCISE PROGRAM:   Access Code: [email or web address] URL: [email or web address] Date: 07/28/2023 Prepared by: Connell Kiss  Exercises - Sit to Stand with Hands on Knees  - 1 x daily - 7 x weekly - 2 sets - 10 reps - Standing March with Counter Support  - 1 x daily - 4 x weekly - 3 sets - 10 reps - Heel Toe Raises with Counter Support  - 1 x daily - 4 x weekly - 3 sets - 10  reps - Backward Walking with Counter Support  - 1 x daily - 4 x weekly - 3 sets - 5 reps    GOALS: Goals reviewed with patient? Yes   SHORT TERM GOALS: Target date:  10/24/2023  Patient will be independent in home exercise program to improve strength/mobility for better functional independence with ADLs. Baseline: initiated on 06/29/2023  07/28/2023: updated Goal status: INITIAL   LONG TERM GOALS: Target date: 12/05/2023  Patient will increase ABC score by equal to or greater than 15points to demonstrate statistically significant improvement in mobility and quality of life.  Baseline: 18 09/12/2023: 40% Goal status: MET and CONTINUE   2.  Patient (> 53 years old) will complete five times sit to stand test in < 15 seconds indicating an increased LE strength and improved balance. Baseline: 35.95sec 09/12/2023: 16.14 seconds Goal status: IN PROGRESS  3.  Patient will increase Berg Balance score by > 6 points to demonstrate decreased fall risk during functional activities Baseline: to be completed  09/12/2023: 49/56 Goal status: IN PROGRESS  4.  Patient will increase 10 meter walk test to >0.81m/s as to improve gait speed for better community ambulation and to reduce fall risk. Baseline: 0.41m/s 09/12/2023: USING RW: Average Normal speed: 0.515 m/s & Average Fast speed: 0.69 m/s  --- WITHOUT AD: Average Normal speed: 0.785 m/s & Average Fast speed: 0.915 m/s with SBA for safety  Goal status: IN PROGRESS  5.  Patient will reduce timed up and go to <11 seconds to reduce fall risk and demonstrate improved transfer/gait ability. Baseline: 1:34 min with RW 09/12/2023: 23.185 seconds using RW with supervision/mod-I & 21.695 seconds, no AD, with SBA for safety  Goal status: IN PROGRESS  ASSESSMENT:  CLINICAL IMPRESSION:   Patient continuing to present with good motivation for completion of physical therapy activities.  Completed all interventions without assistive device again this date.  Patient completing gait & continuing to demonstrate knee hyperextension, Trendelenberg, & decreased heel strike with comfortable gait speed. Patient participating in dynamic  gait activities to include forward, lateral, posterior stepping, as well as random turning in order to challenge balance. Patient educated on POC for future sessions to include continued dynamic gait activities to simulate return to work activities. Pt will continue to benefit from skilled physical therapy intervention to address impairments, improve QOL, and attain therapy goals.      OBJECTIVE IMPAIRMENTS: Abnormal gait, decreased activity tolerance, decreased balance, decreased coordination, decreased endurance, decreased knowledge of condition, decreased knowledge of use of DME, decreased mobility, difficulty walking, decreased ROM, decreased strength, impaired sensation, impaired UE functional use, improper body mechanics, and pain.   ACTIVITY LIMITATIONS: lifting, standing, squatting, sleeping, transfers, bed mobility, bathing, reach over head, hygiene/grooming, locomotion level, and caring for others  PARTICIPATION LIMITATIONS: cleaning, laundry, driving, shopping, community activity, occupation, and yard work  PERSONAL FACTORS: 3+ comorbidities: HTN, seizures, CVA, functional movement disorder are also affecting patient's functional outcome.   REHAB POTENTIAL: Excellent  CLINICAL DECISION MAKING: Evolving/moderate complexity  EVALUATION COMPLEXITY: Moderate  PLAN:  PT FREQUENCY: 1-2x/week  PT DURATION: 12 weeks  PLANNED INTERVENTIONS: 97164- PT Re-evaluation, 97750- Physical Performance Testing, 97110-Therapeutic exercises, 97530- Therapeutic activity, [id number]- Neuromuscular re-education, 97535- Self Care, [id number]- Manual therapy, [phone]- Gait training, [phone]- Orthotic Initial, [phone]- Orthotic/Prosthetic subsequent, [phone]- Aquatic Therapy, Patient/Family education, Balance training, Stair training, Taping, Dry Needling, Joint mobilization, Joint manipulation, Spinal manipulation, Spinal mobilization, Vestibular training, Visual/preceptual remediation/compensation, DME instructions,  Cryotherapy, and Moist heat  PLAN FOR NEXT SESSION:  -  Continue progressive dynamic gait training - sudden turning, visual scanning - resisted gait training? Treadmill?  - use of agility ladder for increased lateral step length - simulating work environment in order to practice lateral and posterior stepping strategies; Blaze pods, unpredictable environment -reassess recommendation for brace  Chiquita Silvan, SPT Physical Therapy Student - [place]  [facility]    5:35 PM 10/12/23

## 2023-10-14 ENCOUNTER — Ambulatory Visit: Payer: MEDICAID | Admitting: Physical Therapy

## 2023-10-14 DIAGNOSIS — R269 Unspecified abnormalities of gait and mobility: Secondary | ICD-10-CM

## 2023-10-14 DIAGNOSIS — M6281 Muscle weakness (generalized): Secondary | ICD-10-CM

## 2023-10-14 DIAGNOSIS — R262 Difficulty in walking, not elsewhere classified: Secondary | ICD-10-CM | POA: Diagnosis not present

## 2023-10-14 DIAGNOSIS — R278 Other lack of coordination: Secondary | ICD-10-CM

## 2023-10-14 NOTE — Therapy (Signed)
 OUTPATIENT PHYSICAL THERAPY TREATMENT   Patient Name: Heather Figueroa MRN: [id number] DOB:[birth year], 53 y.o., female Today's Date: 10/14/2023  PCP: Diedra Lame, MD REFERRING PROVIDER: Diedra Lame, MD  END OF SESSION:   PT End of Session - 10/14/23 1018     Visit Number 14    Number of Visits 48    Date for PT Re-Evaluation 12/05/23    Authorization Type 12 visits from 7/21 - 10/2 (8/22 is 6 of 12)    Progress Note Due on Visit 20    PT Start Time 1016    PT Stop Time 1101    PT Time Calculation (min) 45 min    Equipment Utilized During Treatment Gait belt    Activity Tolerance Patient tolerated treatment well    Behavior During Therapy [facility] for tasks assessed/performed                   Past Medical History:  Diagnosis Date   Anemia    Anxiety    Arthritis    Depression    Headache    Hypertension    Neuromuscular disorder (HCC)    Seizures (HCC)    pt has had 2 seizures in July 2018   Stroke [facility])    possible TIA in July 2018   Past Surgical History:  Procedure Laterality Date   ABDOMINAL HYSTERECTOMY     2002   ANTERIOR CERVICAL DECOMP/DISCECTOMY FUSION N/A 01/26/2017   Procedure: ANTERIOR CERVICAL DECOMPRESSION/DISCECTOMY FUSION, INTERBODY PROSTHESIS, PLATE/SCREWS, POSSIBLE CORPECTOMY CERVICAL FOUR- CERVICAL FIVE, CERVICAL FIVE- CERVICAL SIX, CERVICAL SIX- CERVICAL SEVEN;  Surgeon: Mavis Purchase, MD;  Location: [facility] OR;  Service: Neurosurgery;  Laterality: N/A;  ANTERIOR CERVICAL DECOMPRESSION/DISCECTOMY FUSION, INTERBODY PROSTHESIS, PLATE/SCREWS, POSSIBLE CORPE   APPENDECTOMY     2010   COLONOSCOPY N/A 04/05/2022   Procedure: COLONOSCOPY;  Surgeon: Onita Elspeth Sharper, DO;  Location: [facility] ENDOSCOPY;  Service: Gastroenterology;  Laterality: N/A;   ESOPHAGOGASTRODUODENOSCOPY N/A 04/05/2022   Procedure: ESOPHAGOGASTRODUODENOSCOPY (EGD);  Surgeon: Onita Elspeth Sharper, DO;  Location: [facility] ENDOSCOPY;  Service: Gastroenterology;  Laterality:  N/A;   OVARIAN CYST REMOVAL     TUBAL LIGATION     1998   Patient Active Problem List   Diagnosis Date Noted   Prediabetes 05/24/2023   Seizure (HCC) 05/23/2023   ASD (atrial septal defect): Possible 06/20/2021   Smoking history 06/20/2021   Patent foramen ovale 06/20/2021   Hypokalemia 06/19/2021   Atypical chest pain 06/18/2021   Seizure disorder (HCC) 03/20/2021   Hypertension associated with diabetes (HCC) 03/20/2021   Bipolar disorder, in partial remission, most recent episode depressed (HCC) 03/20/2021   Well woman exam with routine gynecological exam 01/16/2019   Cervical spondylosis with myelopathy and radiculopathy 01/26/2017   Depression 12/18/2015   ONSET DATE: 05/23/2023  REFERRING DIAG:  Diagnosis  R53.1 (ICD-10-CM) - Weakness    THERAPY DIAG:  Difficulty in walking, not elsewhere classified  Muscle weakness (generalized)  Other lack of coordination  Abnormality of gait  Rationale for Evaluation and Treatment: Rehabilitation  SUBJECTIVE:  SUBJECTIVE STATEMENT:  Pt stated that she is very tired this morning, as she was not able to sleep last night until about 5 AM & only got about 3 hours of sleep. Pt stated that she was tired after last session, but felt okay. Pt states that she has an appointment with her doctor soon, but is unsure of when it is scheduled for. Pt denies any falls or stumbles since previous session. Pt denies any new medical or medication updates.      Patient reports her goal is to improve her tolerance to be on the RLE more throughout the day.    PERTINENT HISTORY:  Pt reports that she had Seizure and CVA  roughly 3-4 weeks ago followed by hospital admission. Reports that R side, UE and LE were both affected. States that she now has difficulty with  gait, bathing, transfers, and dressing tasks due to increased R side weakness. Due to lack of payor difficulties, she Reports that she received no PT or OT  following recent hospitalizaiton. Neck surgery in 2018, following which she had to re-learn how to walk Reports that she started having weakness in 2017 after they brought me back to life.   PAIN: None today     PRECAUTIONS: Fall  RED FLAGS: None  WEIGHT BEARING RESTRICTIONS: limited by strength, no official restriction from Cspine surgery 2018  FALLS: Has patient fallen in last 6 months? No  LIVING ENVIRONMENT: Lives with: lives with their son Lives in: House/apartment Stairs: Yes: External: 2 steps; none deep steps that walker can fit  Has following equipment at home: Walker - 2 wheeled  PLOF: Independent with basic ADLs and Independent with household mobility without device  PATIENT GOALS: Get stronger. Walk without walker.   OBJECTIVE:  Note: Objective measures were completed at Evaluation unless otherwise noted.  DIAGNOSTIC FINDINGS:   IMPRESSION: 1. No acute intracranial abnormality. 2. No seizure focus identified. 3. Findings of chronic small vessel ischemia.  COGNITION: Overall cognitive status: Within functional limits for tasks assessed   SENSATION: Light touch: Impaired   COORDINATION: Finger to nose: mild ataxia and decreased ROM due to strength/pain  Ankle knee:   POSTURE: rounded shoulders, forward head, and elevated shoulders  LOWER EXTREMITY ROM:    To be assessed   LOWER EXTREMITY MMT:    MMT Right Eval Left Eval  Hip flexion 3 4-  Hip extension 3+ 4  Hip abduction 4- 4-  Hip adduction 4- 4-  Hip internal rotation    Hip external rotation    Knee flexion 2 4-  Knee extension 3 4  Ankle dorsiflexion 2 4-  Ankle plantarflexion    Ankle inversion    Ankle eversion    (Blank rows = not tested)  BED MOBILITY:  Not tested  TRANSFERS: Sit to stand: SBA  Assistive device  utilized: Environmental consultant - 2 wheeled     Stand to sit: SBA  Assistive device utilized: Environmental consultant - 2 wheeled     Chair to chair: SBA  Assistive device utilized: Environmental consultant - 2 wheeled       GAIT: Findings: Gait Characteristics: step to pattern, step through pattern, decreased stride length, Right steppage, Right foot flat, and poor foot clearance- Right, Distance walked: inconsistent step to leading with RLE and then LLE, Assistive device utilized:Walker - 2 wheeled, Level of assistance: CGA, and Comments: inconstitent step to pattern, inconsistent foot drop drag on the RLE. Noted to have midfoot contact in turns, but toe contact   FUNCTIONAL  TESTS:  5 times sit to stand: 35.95 Timed up and go (TUG): 1:34 min with RW  10 meter walk test: 2:11 min with RW. 0.62m/s  Berg Balance Scale: 31  PATIENT SURVEYS:  ABC scale 18%                                                                                                                            TREATMENT DATE: 10/14/2023  Unless otherwise stated, CGA was provided and gait belt donned in order to ensure pt safety throughout session.  Pt wearing her ASO ankle brace throughout session.  Gait 450' without AD, close SBA, verbal cueing to increase & then maintain gait speed. Continued to note improved gait mechanics with increased gait speed to include decreased Trendelenberg & knee hyperextension.   Dynamic gait using blaze pods in kitchen to simulate work environment, promote navigation of small spaces via lateral, posterior stepping as appropriate.  1st round: Hurdle, 1/2 foam roll, randomly cut off by thearpist 3 min., 49 hits.  Pt reported unsteadiness when stepping over hurdles, demonstrated some imbalance when completing SLS phase of navigating over hurdle No significant LOB noted throughout activity 2nd round: L = blue, R = purple for cognitive dual task- spaced out one of the pods to be through small space, continuing to be intermittently cut off by  therapist to simulate unpredictable environment, airex pad, hurdle placed intermittently; 42 hits, 3 minutes Pt demonstrated some minor imbalance when completing SLS on airex pad without major LOB  Agility ladder activities:  Lateral stepping with 4# AW on RLE, 4x up & down Pt able to increase lateral step length within session Pt reported that she intermittently felt unsteady, would like to continue to work on this activity Backwards walking with 4# AW on RLE, 3x up and down, one foot in each square Pt able to complete activity with increase confidence within session Pt demonstrating some instability with SLS on RLE, able to maintain upright posture without therapist assist  Stepping at stairs; all completed with 4# AW on RLE, no UE support at handrails to promote strength of LE stabilizers in SLS  LLE on first step, RLE on second step 10x RLE on first step, LLE on second step 10x LLE on first step, RLE on third step 10x RLE on first step, LLE on third step 10x  Patient without LOB, able to maintain SLS with success.   Patient reporting fatigue at end of session. Patient inquiring about POC moving forward; reporting that she found lateral stepping and dynamic gait activities beneficial & felt that these activities work helping her to address her goals. Pt educated on continuation of POC to address continued dynamic gait, strengthening, preparation of return to work activities.     PATIENT EDUCATION: Education details: POC. HEP. Pt educated throughout session about proper posture and technique with exercises. Improved exercise technique, movement at target joints, use of target muscles after min to mod verbal, visual, tactile cues.  Person educated: Patient Education method: Explanation Education comprehension: verbalized understanding  HOME EXERCISE PROGRAM:   Access Code: [email or web address] URL: [email or web address] Date: 07/28/2023 Prepared by: Connell Kiss  Exercises -  Sit to Stand with Hands on Knees  - 1 x daily - 7 x weekly - 2 sets - 10 reps - Standing March with Counter Support  - 1 x daily - 4 x weekly - 3 sets - 10 reps - Heel Toe Raises with Counter Support  - 1 x daily - 4 x weekly - 3 sets - 10 reps - Backward Walking with Counter Support  - 1 x daily - 4 x weekly - 3 sets - 5 reps    GOALS: Goals reviewed with patient? Yes   SHORT TERM GOALS: Target date: 10/24/2023  Patient will be independent in home exercise program to improve strength/mobility for better functional independence with ADLs. Baseline: initiated on 06/29/2023  07/28/2023: updated Goal status: INITIAL   LONG TERM GOALS: Target date: 12/05/2023  Patient will increase ABC score by equal to or greater than 15points to demonstrate statistically significant improvement in mobility and quality of life.  Baseline: 18 09/12/2023: 40% Goal status: MET and CONTINUE   2.  Patient (> 52 years old) will complete five times sit to stand test in < 15 seconds indicating an increased LE strength and improved balance. Baseline: 35.95sec 09/12/2023: 16.14 seconds Goal status: IN PROGRESS  3.  Patient will increase Berg Balance score by > 6 points to demonstrate decreased fall risk during functional activities Baseline: to be completed  09/12/2023: 49/56 Goal status: IN PROGRESS  4.  Patient will increase 10 meter walk test to >0.4m/s as to improve gait speed for better community ambulation and to reduce fall risk. Baseline: 0.83m/s 09/12/2023: USING RW: Average Normal speed: 0.515 m/s & Average Fast speed: 0.69 m/s  --- WITHOUT AD: Average Normal speed: 0.785 m/s & Average Fast speed: 0.915 m/s with SBA for safety  Goal status: IN PROGRESS  5.  Patient will reduce timed up and go to <11 seconds to reduce fall risk and demonstrate improved transfer/gait ability. Baseline: 1:34 min with RW 09/12/2023: 23.185 seconds using RW with supervision/mod-I & 21.695 seconds, no AD, with SBA for safety   Goal status: IN PROGRESS  ASSESSMENT:  CLINICAL IMPRESSION:   Patient continuing to present with good motivation for completion of physical therapy activities.  Completed all interventions without assistive device again this date.  Patient completing gait & continuing to demonstrate knee hyperextension, Trendelenberg that improves with increased gait speed. Maintenance of increased gait speed requiring verbal cueing. Patient participating in dynamic gait activities to include forward & lateral stepping, quick turning, stepping over hurdles, stepping onto airex pad, & therapist walking in front of patient to mimic unpredictable work environment in order to challenge balance. Patient educated on POC for future sessions to include continued dynamic gait activities to simulate return to work activities. Pt will continue to benefit from skilled physical therapy intervention to address impairments, improve QOL, and attain therapy goals.      OBJECTIVE IMPAIRMENTS: Abnormal gait, decreased activity tolerance, decreased balance, decreased coordination, decreased endurance, decreased knowledge of condition, decreased knowledge of use of DME, decreased mobility, difficulty walking, decreased ROM, decreased strength, impaired sensation, impaired UE functional use, improper body mechanics, and pain.   ACTIVITY LIMITATIONS: lifting, standing, squatting, sleeping, transfers, bed mobility, bathing, reach over head, hygiene/grooming, locomotion level, and caring for others  PARTICIPATION LIMITATIONS: cleaning, laundry, driving, shopping, community activity, occupation, and  yard work  PERSONAL FACTORS: 3+ comorbidities: HTN, seizures, CVA, functional movement disorder are also affecting patient's functional outcome.   REHAB POTENTIAL: Excellent  CLINICAL DECISION MAKING: Evolving/moderate complexity  EVALUATION COMPLEXITY: Moderate  PLAN:  PT FREQUENCY: 1-2x/week  PT DURATION: 12 weeks  PLANNED  INTERVENTIONS: 97164- PT Re-evaluation, 97750- Physical Performance Testing, 97110-Therapeutic exercises, 97530- Therapeutic activity, [id number]- Neuromuscular re-education, 97535- Self Care, [id number]- Manual therapy, [phone]- Gait training, [phone]- Orthotic Initial, [phone]- Orthotic/Prosthetic subsequent, [phone]- Aquatic Therapy, Patient/Family education, Balance training, Stair training, Taping, Dry Needling, Joint mobilization, Joint manipulation, Spinal manipulation, Spinal mobilization, Vestibular training, Visual/preceptual remediation/compensation, DME instructions, Cryotherapy, and Moist heat  PLAN FOR NEXT SESSION:  - Continue progressive dynamic gait training - visual scanning - resisted gait training? Treadmill?  - simulating work environment in order to practice lateral and posterior stepping strategies; Blaze pods, unpredictable environment -picking up objects of unknown weight -reassess recommendation for brace -assess HEP, update as appropriate  Chiquita Silvan, SPT Physical Therapy Student - [place]  [facility]    11:20 AM 10/14/23

## 2023-10-19 ENCOUNTER — Ambulatory Visit: Payer: MEDICAID | Admitting: Physical Therapy

## 2023-10-19 DIAGNOSIS — R278 Other lack of coordination: Secondary | ICD-10-CM

## 2023-10-19 DIAGNOSIS — R262 Difficulty in walking, not elsewhere classified: Secondary | ICD-10-CM | POA: Diagnosis not present

## 2023-10-19 DIAGNOSIS — R269 Unspecified abnormalities of gait and mobility: Secondary | ICD-10-CM

## 2023-10-19 DIAGNOSIS — M6281 Muscle weakness (generalized): Secondary | ICD-10-CM

## 2023-10-19 NOTE — Therapy (Signed)
 OUTPATIENT PHYSICAL THERAPY TREATMENT   Patient Name: Heather Figueroa MRN: [id number] DOB:1970-06-12, 53 y.o., female Today's Date: 10/19/2023  PCP: Diedra Lame, MD REFERRING PROVIDER: Diedra Lame, MD  END OF SESSION:    PT End of Session - 10/19/23 1642     Visit Number 15    Number of Visits 48    Date for PT Re-Evaluation 12/05/23    Authorization Type 12 visits from 7/21 - 10/2 (8/27 is 7 of 12)    Progress Note Due on Visit 20    PT Start Time 1537    PT Stop Time 1620    PT Time Calculation (min) 43 min    Equipment Utilized During Treatment Gait belt    Activity Tolerance Patient tolerated treatment well    Behavior During Therapy [facility] for tasks assessed/performed                    Past Medical History:  Diagnosis Date   Anemia    Anxiety    Arthritis    Depression    Headache    Hypertension    Neuromuscular disorder (HCC)    Seizures (HCC)    pt has had 2 seizures in July 2018   Stroke [facility])    possible TIA in July 2018   Past Surgical History:  Procedure Laterality Date   ABDOMINAL HYSTERECTOMY     2002   ANTERIOR CERVICAL DECOMP/DISCECTOMY FUSION N/A 01/26/2017   Procedure: ANTERIOR CERVICAL DECOMPRESSION/DISCECTOMY FUSION, INTERBODY PROSTHESIS, PLATE/SCREWS, POSSIBLE CORPECTOMY CERVICAL FOUR- CERVICAL FIVE, CERVICAL FIVE- CERVICAL SIX, CERVICAL SIX- CERVICAL SEVEN;  Surgeon: Mavis Purchase, MD;  Location: [facility] OR;  Service: Neurosurgery;  Laterality: N/A;  ANTERIOR CERVICAL DECOMPRESSION/DISCECTOMY FUSION, INTERBODY PROSTHESIS, PLATE/SCREWS, POSSIBLE CORPE   APPENDECTOMY     2010   COLONOSCOPY N/A 04/05/2022   Procedure: COLONOSCOPY;  Surgeon: Onita Elspeth Sharper, DO;  Location: [facility] ENDOSCOPY;  Service: Gastroenterology;  Laterality: N/A;   ESOPHAGOGASTRODUODENOSCOPY N/A 04/05/2022   Procedure: ESOPHAGOGASTRODUODENOSCOPY (EGD);  Surgeon: Onita Elspeth Sharper, DO;  Location: [facility] ENDOSCOPY;  Service: Gastroenterology;   Laterality: N/A;   OVARIAN CYST REMOVAL     TUBAL LIGATION     1998   Patient Active Problem List   Diagnosis Date Noted   Prediabetes 05/24/2023   Seizure (HCC) 05/23/2023   ASD (atrial septal defect): Possible 06/20/2021   Smoking history 06/20/2021   Patent foramen ovale 06/20/2021   Hypokalemia 06/19/2021   Atypical chest pain 06/18/2021   Seizure disorder (HCC) 03/20/2021   Hypertension associated with diabetes (HCC) 03/20/2021   Bipolar disorder, in partial remission, most recent episode depressed (HCC) 03/20/2021   Well woman exam with routine gynecological exam 01/16/2019   Cervical spondylosis with myelopathy and radiculopathy 01/26/2017   Depression 12/18/2015   ONSET DATE: 05/23/2023  REFERRING DIAG:  Diagnosis  R53.1 (ICD-10-CM) - Weakness    THERAPY DIAG:  Difficulty in walking, not elsewhere classified  Abnormality of gait  Muscle weakness (generalized)  Other lack of coordination  Rationale for Evaluation and Treatment: Rehabilitation  SUBJECTIVE:  SUBJECTIVE STATEMENT:  Pt stated that she is sleepy this afternoon, as she had to attend a funeral on Monday and had lots of visitors that she had to help with. Pt stated that she did wear ~6' heels for the service; did report some soreness after being up and walking in these all day, particularly in the thighs & the back of her legs. Ankle hurt for approx 15 min.; did have some swelling that resolved with elevation. Pt stated that she felt tired, but no soreness after last session. Pt denies falls or stumbles since last session.     Patient reports her goal is to improve her tolerance to be on the RLE more throughout the day.    PERTINENT HISTORY:  Pt reports that she had Seizure and CVA  roughly 3-4 weeks ago followed by  hospital admission. Reports that R side, UE and LE were both affected. States that she now has difficulty with gait, bathing, transfers, and dressing tasks due to increased R side weakness. Due to lack of payor difficulties, she Reports that she received no PT or OT  following recent hospitalizaiton. Neck surgery in 2018, following which she had to re-learn how to walk Reports that she started having weakness in 2017 after they brought me back to life.   PAIN: None today     PRECAUTIONS: Fall  RED FLAGS: None  WEIGHT BEARING RESTRICTIONS: limited by strength, no official restriction from Cspine surgery 2018  FALLS: Has patient fallen in last 6 months? No  LIVING ENVIRONMENT: Lives with: lives with their son Lives in: House/apartment Stairs: Yes: External: 2 steps; none deep steps that walker can fit  Has following equipment at home: Walker - 2 wheeled  PLOF: Independent with basic ADLs and Independent with household mobility without device  PATIENT GOALS: Get stronger. Walk without walker.   OBJECTIVE:  Note: Objective measures were completed at Evaluation unless otherwise noted.  DIAGNOSTIC FINDINGS:   IMPRESSION: 1. No acute intracranial abnormality. 2. No seizure focus identified. 3. Findings of chronic small vessel ischemia.  COGNITION: Overall cognitive status: Within functional limits for tasks assessed   SENSATION: Light touch: Impaired   COORDINATION: Finger to nose: mild ataxia and decreased ROM due to strength/pain  Ankle knee:   POSTURE: rounded shoulders, forward head, and elevated shoulders  LOWER EXTREMITY ROM:    To be assessed   LOWER EXTREMITY MMT:    MMT Right Eval Left Eval  Hip flexion 3 4-  Hip extension 3+ 4  Hip abduction 4- 4-  Hip adduction 4- 4-  Hip internal rotation    Hip external rotation    Knee flexion 2 4-  Knee extension 3 4  Ankle dorsiflexion 2 4-  Ankle plantarflexion    Ankle inversion    Ankle eversion     (Blank rows = not tested)  BED MOBILITY:  Not tested  TRANSFERS: Sit to stand: SBA  Assistive device utilized: Environmental consultant - 2 wheeled     Stand to sit: SBA  Assistive device utilized: Environmental consultant - 2 wheeled     Chair to chair: SBA  Assistive device utilized: Environmental consultant - 2 wheeled       GAIT: Findings: Gait Characteristics: step to pattern, step through pattern, decreased stride length, Right steppage, Right foot flat, and poor foot clearance- Right, Distance walked: inconsistent step to leading with RLE and then LLE, Assistive device utilized:Walker - 2 wheeled, Level of assistance: CGA, and Comments: inconstitent step to pattern, inconsistent foot drop drag  on the RLE. Noted to have midfoot contact in turns, but toe contact   FUNCTIONAL TESTS:  5 times sit to stand: 35.95 Timed up and go (TUG): 1:34 min with RW  10 meter walk test: 2:11 min with RW. 0.32m/s  Berg Balance Scale: 31  PATIENT SURVEYS:  ABC scale 18%                                                                                                                            TREATMENT DATE: 10/19/2023  Unless otherwise stated, CGA was provided and gait belt donned in order to ensure pt safety throughout session.  Pt wearing her ASO ankle brace throughout session.  Pt completed dynamic gait with focus on visual scanning & quick turns; able to progress to adding walking backwards. Verbal cueing for increased gait speed throughout. Pt completed >300' without AD. Pt with 1 instance of decreased stability when completing activity & turning head to L side but able to recover independently without therapist intervention. Continued to note knee hyperextension & Trendelenberg gait this date.   Pt able to pick up weighted balls of varying weight (up to 9#) from mat table, step to additional mat table ~6' away. Pt started with forward stepping, quickly progressed to lateral stepping, 4x laps total. Of note, on final 2 laps, added 2x foam rolls for  pt to step over. Pt without LOB throughout activity.   Pt completed following obstacle course: bending to pick up/put down cones, stepping over hurdles, stepping over ~6' box, stepping onto airex pad & tossing ball into air, in order to promote functional SLS needed for return to work activities, as well as reactive balance on airex pad. Of note, decreased knee hyperextension noted when completing hurdles.   Stepping at stairs; all completed with 4# AW on RLE, no UE support at handrails to promote strength of LE stabilizers in SLS LLE on first step, RLE on third step 10x RLE on first step, LLE on third step 10x  Patient without LOB, able to maintain SLS with success.  Pt reporting fatigue at end of session. Throughout session, educated pt on progress with activities.        PATIENT EDUCATION: Education details: POC. HEP. Pt educated throughout session about proper posture and technique with exercises. Improved exercise technique, movement at target joints, use of target muscles after min to mod verbal, visual, tactile cues.  Person educated: Patient Education method: Explanation Education comprehension: verbalized understanding  HOME EXERCISE PROGRAM:   Access Code: [email or web address] URL: [email or web address] Date: 07/28/2023 Prepared by: Connell Kiss  Exercises - Sit to Stand with Hands on Knees  - 1 x daily - 7 x weekly - 2 sets - 10 reps - Standing March with Counter Support  - 1 x daily - 4 x weekly - 3 sets - 10 reps - Heel Toe Raises with Counter Support  - 1 x daily - 4 x weekly - 3 sets - 10 reps -  Backward Walking with Counter Support  - 1 x daily - 4 x weekly - 3 sets - 5 reps    GOALS: Goals reviewed with patient? Yes   SHORT TERM GOALS: Target date: 10/24/2023  Patient will be independent in home exercise program to improve strength/mobility for better functional independence with ADLs. Baseline: initiated on 06/29/2023  07/28/2023: updated Goal status:  INITIAL   LONG TERM GOALS: Target date: 12/05/2023  Patient will increase ABC score by equal to or greater than 15points to demonstrate statistically significant improvement in mobility and quality of life.  Baseline: 18 09/12/2023: 40% Goal status: MET and CONTINUE   2.  Patient (> 28 years old) will complete five times sit to stand test in < 15 seconds indicating an increased LE strength and improved balance. Baseline: 35.95sec 09/12/2023: 16.14 seconds Goal status: IN PROGRESS  3.  Patient will increase Berg Balance score by > 6 points to demonstrate decreased fall risk during functional activities Baseline: to be completed  09/12/2023: 49/56 Goal status: IN PROGRESS  4.  Patient will increase 10 meter walk test to >0.76m/s as to improve gait speed for better community ambulation and to reduce fall risk. Baseline: 0.45m/s 09/12/2023: USING RW: Average Normal speed: 0.515 m/s & Average Fast speed: 0.69 m/s  --- WITHOUT AD: Average Normal speed: 0.785 m/s & Average Fast speed: 0.915 m/s with SBA for safety  Goal status: IN PROGRESS  5.  Patient will reduce timed up and go to <11 seconds to reduce fall risk and demonstrate improved transfer/gait ability. Baseline: 1:34 min with RW 09/12/2023: 23.185 seconds using RW with supervision/mod-I & 21.695 seconds, no AD, with SBA for safety  Goal status: IN PROGRESS  ASSESSMENT:  CLINICAL IMPRESSION:    Patient continuing to present with good motivation for completion of physical therapy activities.  Completed all interventions without assistive device again this date.  Patient completing gait & continuing to demonstrate knee hyperextension, Trendelenberg that improves with increased gait speed. Maintenance of increased gait speed requiring verbal cueing. Patient participating in dynamic gait activities to promote visual scanning of environment, quick turns, & walking backwards in order to challenge dynamic balance. Patient educated on progress  with gait, strength, & dynamic balance activities.  Pt will continue to benefit from skilled physical therapy intervention to address impairments, improve QOL, and attain therapy goals.      OBJECTIVE IMPAIRMENTS: Abnormal gait, decreased activity tolerance, decreased balance, decreased coordination, decreased endurance, decreased knowledge of condition, decreased knowledge of use of DME, decreased mobility, difficulty walking, decreased ROM, decreased strength, impaired sensation, impaired UE functional use, improper body mechanics, and pain.   ACTIVITY LIMITATIONS: lifting, standing, squatting, sleeping, transfers, bed mobility, bathing, reach over head, hygiene/grooming, locomotion level, and caring for others  PARTICIPATION LIMITATIONS: cleaning, laundry, driving, shopping, community activity, occupation, and yard work  PERSONAL FACTORS: 3+ comorbidities: HTN, seizures, CVA, functional movement disorder are also affecting patient's functional outcome.   REHAB POTENTIAL: Excellent  CLINICAL DECISION MAKING: Evolving/moderate complexity  EVALUATION COMPLEXITY: Moderate  PLAN:  PT FREQUENCY: 1-2x/week  PT DURATION: 12 weeks  PLANNED INTERVENTIONS: 97164- PT Re-evaluation, 97750- Physical Performance Testing, 97110-Therapeutic exercises, 97530- Therapeutic activity, [id number]- Neuromuscular re-education, 97535- Self Care, [id number]- Manual therapy, [phone]- Gait training, [phone]- Orthotic Initial, [phone]- Orthotic/Prosthetic subsequent, [phone]- Aquatic Therapy, Patient/Family education, Balance training, Stair training, Taping, Dry Needling, Joint mobilization, Joint manipulation, Spinal manipulation, Spinal mobilization, Vestibular training, Visual/preceptual remediation/compensation, DME instructions, Cryotherapy, and Moist heat  PLAN FOR NEXT SESSION:   - Continue  progressive dynamic gait training - resisted gait training? Treadmill?  - reassess recommendation for brace -mini lunges/weighted  lunges for motor control to prevent knee hyperextension - Reactive balance - simulating work environment in order to practice lateral and posterior stepping strategies; Blaze pods, unpredictable environment -assess HEP, update as appropriate  Chiquita Silvan, SPT Physical Therapy Student - [place]  [facility]    4:59 PM 10/19/23

## 2023-10-21 ENCOUNTER — Ambulatory Visit: Payer: MEDICAID

## 2023-10-21 DIAGNOSIS — R269 Unspecified abnormalities of gait and mobility: Secondary | ICD-10-CM

## 2023-10-21 DIAGNOSIS — R262 Difficulty in walking, not elsewhere classified: Secondary | ICD-10-CM | POA: Diagnosis not present

## 2023-10-21 DIAGNOSIS — R278 Other lack of coordination: Secondary | ICD-10-CM

## 2023-10-21 DIAGNOSIS — M6281 Muscle weakness (generalized): Secondary | ICD-10-CM

## 2023-10-21 NOTE — Therapy (Signed)
 OUTPATIENT PHYSICAL THERAPY TREATMENT   Patient Name: Heather Figueroa MRN: [id number] DOB:09/18/1970, 53 y.o., female Today's Date: 10/21/2023  PCP: Diedra Lame, MD REFERRING PROVIDER: Diedra Lame, MD  END OF SESSION:     PT End of Session - 10/21/23 1019     Visit Number 16    Number of Visits 48    Date for PT Re-Evaluation 12/05/23    Authorization Type 12 visits from 7/21 - 10/2 (8/29 is 8 of 12)    Progress Note Due on Visit 20    PT Start Time 1019    PT Stop Time 1102    PT Time Calculation (min) 43 min    Equipment Utilized During Treatment Gait belt    Activity Tolerance Patient tolerated treatment well    Behavior During Therapy [facility] for tasks assessed/performed                     Past Medical History:  Diagnosis Date   Anemia    Anxiety    Arthritis    Depression    Headache    Hypertension    Neuromuscular disorder (HCC)    Seizures (HCC)    pt has had 2 seizures in July 2018   Stroke [facility])    possible TIA in July 2018   Past Surgical History:  Procedure Laterality Date   ABDOMINAL HYSTERECTOMY     2002   ANTERIOR CERVICAL DECOMP/DISCECTOMY FUSION N/A 01/26/2017   Procedure: ANTERIOR CERVICAL DECOMPRESSION/DISCECTOMY FUSION, INTERBODY PROSTHESIS, PLATE/SCREWS, POSSIBLE CORPECTOMY CERVICAL FOUR- CERVICAL FIVE, CERVICAL FIVE- CERVICAL SIX, CERVICAL SIX- CERVICAL SEVEN;  Surgeon: Mavis Purchase, MD;  Location: [facility] OR;  Service: Neurosurgery;  Laterality: N/A;  ANTERIOR CERVICAL DECOMPRESSION/DISCECTOMY FUSION, INTERBODY PROSTHESIS, PLATE/SCREWS, POSSIBLE CORPE   APPENDECTOMY     2010   COLONOSCOPY N/A 04/05/2022   Procedure: COLONOSCOPY;  Surgeon: Onita Elspeth Sharper, DO;  Location: [facility] ENDOSCOPY;  Service: Gastroenterology;  Laterality: N/A;   ESOPHAGOGASTRODUODENOSCOPY N/A 04/05/2022   Procedure: ESOPHAGOGASTRODUODENOSCOPY (EGD);  Surgeon: Onita Elspeth Sharper, DO;  Location: [facility] ENDOSCOPY;  Service: Gastroenterology;   Laterality: N/A;   OVARIAN CYST REMOVAL     TUBAL LIGATION     1998   Patient Active Problem List   Diagnosis Date Noted   Prediabetes 05/24/2023   Seizure (HCC) 05/23/2023   ASD (atrial septal defect): Possible 06/20/2021   Smoking history 06/20/2021   Patent foramen ovale 06/20/2021   Hypokalemia 06/19/2021   Atypical chest pain 06/18/2021   Seizure disorder (HCC) 03/20/2021   Hypertension associated with diabetes (HCC) 03/20/2021   Bipolar disorder, in partial remission, most recent episode depressed (HCC) 03/20/2021   Well woman exam with routine gynecological exam 01/16/2019   Cervical spondylosis with myelopathy and radiculopathy 01/26/2017   Depression 12/18/2015   ONSET DATE: 05/23/2023  REFERRING DIAG:  Diagnosis  R53.1 (ICD-10-CM) - Weakness    THERAPY DIAG:   Difficulty in walking, not elsewhere classified  Other lack of coordination  Abnormality of gait  Muscle weakness (generalized)  Rationale for Evaluation and Treatment: Rehabilitation  SUBJECTIVE:  SUBJECTIVE STATEMENT:   Pt states that she is doing good today, sleepy this morning. Pt stated that she had some leg soreness after last session that resolved the next day. Pt denies any falls or stumbles.   Pt stated that she would like to continue to work on dynamic balance, especially in a busy environment. Pt reported that she feel like it may be helpful to reach a box or weight from a surface where she would have to reach up for it & see if this affects her balance to prepare for return to work.     Patient reports her goal is to improve her tolerance to be on the RLE more throughout the day.    PERTINENT HISTORY:  Pt reports that she had Seizure and CVA  roughly 3-4 weeks ago followed by hospital admission. Reports  that R side, UE and LE were both affected. States that she now has difficulty with gait, bathing, transfers, and dressing tasks due to increased R side weakness. Due to lack of payor difficulties, she Reports that she received no PT or OT  following recent hospitalizaiton. Neck surgery in 2018, following which she had to re-learn how to walk Reports that she started having weakness in 2017 after they brought me back to life.   PAIN: None today     PRECAUTIONS: Fall  RED FLAGS: None  WEIGHT BEARING RESTRICTIONS: limited by strength, no official restriction from Cspine surgery 2018  FALLS: Has patient fallen in last 6 months? No  LIVING ENVIRONMENT: Lives with: lives with their son Lives in: House/apartment Stairs: Yes: External: 2 steps; none deep steps that walker can fit  Has following equipment at home: Walker - 2 wheeled  PLOF: Independent with basic ADLs and Independent with household mobility without device  PATIENT GOALS: Get stronger. Walk without walker.   OBJECTIVE:  Note: Objective measures were completed at Evaluation unless otherwise noted.  DIAGNOSTIC FINDINGS:   IMPRESSION: 1. No acute intracranial abnormality. 2. No seizure focus identified. 3. Findings of chronic small vessel ischemia.  COGNITION: Overall cognitive status: Within functional limits for tasks assessed   SENSATION: Light touch: Impaired   COORDINATION: Finger to nose: mild ataxia and decreased ROM due to strength/pain  Ankle knee:   POSTURE: rounded shoulders, forward head, and elevated shoulders  LOWER EXTREMITY ROM:    To be assessed   LOWER EXTREMITY MMT:    MMT Right Eval Left Eval  Hip flexion 3 4-  Hip extension 3+ 4  Hip abduction 4- 4-  Hip adduction 4- 4-  Hip internal rotation    Hip external rotation    Knee flexion 2 4-  Knee extension 3 4  Ankle dorsiflexion 2 4-  Ankle plantarflexion    Ankle inversion    Ankle eversion    (Blank rows = not tested)  BED  MOBILITY:  Not tested  TRANSFERS: Sit to stand: SBA  Assistive device utilized: Environmental consultant - 2 wheeled     Stand to sit: SBA  Assistive device utilized: Environmental consultant - 2 wheeled     Chair to chair: SBA  Assistive device utilized: Environmental consultant - 2 wheeled       GAIT: Findings: Gait Characteristics: step to pattern, step through pattern, decreased stride length, Right steppage, Right foot flat, and poor foot clearance- Right, Distance walked: inconsistent step to leading with RLE and then LLE, Assistive device utilized:Walker - 2 wheeled, Level of assistance: CGA, and Comments: inconstitent step to pattern, inconsistent foot drop drag on  the RLE. Noted to have midfoot contact in turns, but toe contact   FUNCTIONAL TESTS:  5 times sit to stand: 35.95 Timed up and go (TUG): 1:34 min with RW  10 meter walk test: 2:11 min with RW. 0.7m/s  Berg Balance Scale: 31  PATIENT SURVEYS:  ABC scale 18%                                                                                                                            TREATMENT DATE: 10/21/2023  Throuhgout session, discussed POC for future sessions related to pt goal of wanting to continue to work on dynamic gait, especially in busy environment.   Unless otherwise stated, CGA was provided and gait belt donned in order to ensure pt safety throughout session.  Pt entered session wearing her ASO ankle brace; left session without ASO ankle brace.   Assessed gait without ASO ankle brace. Continued to note R knee hyperextension that improves with increased gait speed. Of note, no significant difference in gait compared to with ASO ankle brace. Pt educated to gradually decrease use of ASO ankle brace as comfortable; I.e. wearing brace out in community, no brace in therapy.    Following interventions to promote ankle stability in SLS, LE stability:  2x down & back 10 m, mini lunges w/ 5# dumbbell in LUE  Pt with some R ankle instability; able to use ankle strategies to  maintain balance Stepping at stairs; all completed with 5# dumbbell, no UE support at handrails LLE on first step, RLE on third step 2x10 RLE on first step, LLE on third step 2x10  Patient without LOB, able to maintain SLS with success. Able to progress to additional set this date.  Resisted gait at cable column  3x forward/backward: pt continuing to demo some R ankle instability, use of ankle strategies to maintain balance 2x each lateral direction: increased difficulty noted with eccentric portion following leading with LLE   Gait focused on increasing and maintaining speed 300' without AD, close SBA-CGA. Pt with no LOB. Mod verbal cueing to increase & maintain speed following turns needed to navigate environment. Continued to note decreased knee hyperextension & Trendelenberg gait with increased speed.   Assessed backwards gait: ~30' without AD. Pt able to increase step length this date compared to previous sessions without verbal cueing. Pt without significant LOB or moments of instability during activity.      PATIENT EDUCATION: Education details: POC. HEP. Pt educated throughout session about proper posture and technique with exercises. Improved exercise technique, movement at target joints, use of target muscles after min to mod verbal, visual, tactile cues.  Person educated: Patient Education method: Explanation Education comprehension: verbalized understanding  HOME EXERCISE PROGRAM:   Access Code: [email or web address] URL: [email or web address] Date: 07/28/2023 Prepared by: Connell Kiss  Exercises - Sit to Stand with Hands on Knees  - 1 x daily - 7 x weekly - 2 sets - 10 reps - Standing March with  Counter Support  - 1 x daily - 4 x weekly - 3 sets - 10 reps - Heel Toe Raises with Counter Support  - 1 x daily - 4 x weekly - 3 sets - 10 reps - Backward Walking with Counter Support  - 1 x daily - 4 x weekly - 3 sets - 5 reps    GOALS: Goals reviewed with patient?  Yes   SHORT TERM GOALS: Target date: 10/24/2023  Patient will be independent in home exercise program to improve strength/mobility for better functional independence with ADLs. Baseline: initiated on 06/29/2023  07/28/2023: updated Goal status: INITIAL   LONG TERM GOALS: Target date: 12/05/2023  Patient will increase ABC score by equal to or greater than 15points to demonstrate statistically significant improvement in mobility and quality of life.  Baseline: 18 09/12/2023: 40% Goal status: MET and CONTINUE   2.  Patient (> 32 years old) will complete five times sit to stand test in < 15 seconds indicating an increased LE strength and improved balance. Baseline: 35.95sec 09/12/2023: 16.14 seconds Goal status: IN PROGRESS  3.  Patient will increase Berg Balance score by > 6 points to demonstrate decreased fall risk during functional activities Baseline: to be completed  09/12/2023: 49/56 Goal status: IN PROGRESS  4.  Patient will increase 10 meter walk test to >0.32m/s as to improve gait speed for better community ambulation and to reduce fall risk. Baseline: 0.5m/s 09/12/2023: USING RW: Average Normal speed: 0.515 m/s & Average Fast speed: 0.69 m/s  --- WITHOUT AD: Average Normal speed: 0.785 m/s & Average Fast speed: 0.915 m/s with SBA for safety  Goal status: IN PROGRESS  5.  Patient will reduce timed up and go to <11 seconds to reduce fall risk and demonstrate improved transfer/gait ability. Baseline: 1:34 min with RW 09/12/2023: 23.185 seconds using RW with supervision/mod-I & 21.695 seconds, no AD, with SBA for safety  Goal status: IN PROGRESS  ASSESSMENT:  CLINICAL IMPRESSION:     Patient continuing to present with good motivation for completion of physical therapy activities.  Completed all interventions without assistive device again this date.  Patient completing gait & continuing to demonstrate knee hyperextension, Trendelenberg that improves with increased gait speed.  Maintenance of increased gait speed requiring verbal cueing, most notably after navigating through turns. Patient participating in activities to increased ankle & general LE strength, stability. Pt able to progress stair stepping activity this date to include increased repetitions. Pt will continue to benefit from skilled physical therapy intervention to address impairments, improve QOL, and attain therapy goals.      OBJECTIVE IMPAIRMENTS: Abnormal gait, decreased activity tolerance, decreased balance, decreased coordination, decreased endurance, decreased knowledge of condition, decreased knowledge of use of DME, decreased mobility, difficulty walking, decreased ROM, decreased strength, impaired sensation, impaired UE functional use, improper body mechanics, and pain.   ACTIVITY LIMITATIONS: lifting, standing, squatting, sleeping, transfers, bed mobility, bathing, reach over head, hygiene/grooming, locomotion level, and caring for others  PARTICIPATION LIMITATIONS: cleaning, laundry, driving, shopping, community activity, occupation, and yard work  PERSONAL FACTORS: 3+ comorbidities: HTN, seizures, CVA, functional movement disorder are also affecting patient's functional outcome.   REHAB POTENTIAL: Excellent  CLINICAL DECISION MAKING: Evolving/moderate complexity  EVALUATION COMPLEXITY: Moderate  PLAN:  PT FREQUENCY: 1-2x/week  PT DURATION: 12 weeks  PLANNED INTERVENTIONS: [id number]- PT Re-evaluation, 97750- Physical Performance Testing, 97110-Therapeutic exercises, 97530- Therapeutic activity, [id number]- Neuromuscular re-education, 97535- Self Care, [id number]- Manual therapy, [id number]- Gait training, [id number]- Orthotic Initial, [id number]- Orthotic/Prosthetic subsequent, [id number]- Aquatic  Therapy, Patient/Family education, Balance training, Stair training, Taping, Dry Needling, Joint mobilization, Joint manipulation, Spinal manipulation, Spinal mobilization, Vestibular training, Visual/preceptual  remediation/compensation, DME instructions, Cryotherapy, and Moist heat  PLAN FOR NEXT SESSION:    - Continue progressive dynamic gait training - resisted gait training? Treadmill?  - LE strengthening to promote decreased knee hyperextension - Reactive balance - simulating work environment in order to practice lateral and posterior stepping strategies; Blaze pods, unpredictable environment*  -Reaching for box/weight from elevated surface -assess HEP, update as appropriate  Chiquita Silvan, SPT Physical Therapy Student - [facility]  [facility]    11:08 AM 10/21/23

## 2023-10-25 ENCOUNTER — Ambulatory Visit: Payer: MEDICAID | Attending: Family Medicine | Admitting: Physical Therapy

## 2023-10-25 DIAGNOSIS — R278 Other lack of coordination: Secondary | ICD-10-CM | POA: Diagnosis present

## 2023-10-25 DIAGNOSIS — M6281 Muscle weakness (generalized): Secondary | ICD-10-CM | POA: Diagnosis present

## 2023-10-25 DIAGNOSIS — R262 Difficulty in walking, not elsewhere classified: Secondary | ICD-10-CM | POA: Diagnosis present

## 2023-10-25 DIAGNOSIS — R269 Unspecified abnormalities of gait and mobility: Secondary | ICD-10-CM | POA: Insufficient documentation

## 2023-10-25 NOTE — Therapy (Signed)
 OUTPATIENT PHYSICAL THERAPY TREATMENT   Patient Name: Heather Figueroa MRN: [id number] DOB:05-21-1970, 53 y.o., female Today's Date: 10/25/2023  PCP: Diedra Lame, MD REFERRING PROVIDER: Diedra Lame, MD  END OF SESSION:      PT End of Session - 10/25/23 0939     Visit Number 17    Number of Visits 48    Date for PT Re-Evaluation 12/05/23    Authorization Type 12 visits from 7/21 - 10/2 (9/2 is 9 of 12)    Progress Note Due on Visit 20    PT Start Time 0937    PT Stop Time 1017    PT Time Calculation (min) 40 min    Equipment Utilized During Treatment Gait belt    Activity Tolerance Patient tolerated treatment well    Behavior During Therapy [facility] for tasks assessed/performed                      Past Medical History:  Diagnosis Date   Anemia    Anxiety    Arthritis    Depression    Headache    Hypertension    Neuromuscular disorder (HCC)    Seizures (HCC)    pt has had 2 seizures in July 2018   Stroke [facility])    possible TIA in July 2018   Past Surgical History:  Procedure Laterality Date   ABDOMINAL HYSTERECTOMY     2002   ANTERIOR CERVICAL DECOMP/DISCECTOMY FUSION N/A 01/26/2017   Procedure: ANTERIOR CERVICAL DECOMPRESSION/DISCECTOMY FUSION, INTERBODY PROSTHESIS, PLATE/SCREWS, POSSIBLE CORPECTOMY CERVICAL FOUR- CERVICAL FIVE, CERVICAL FIVE- CERVICAL SIX, CERVICAL SIX- CERVICAL SEVEN;  Surgeon: Mavis Purchase, MD;  Location: [facility] OR;  Service: Neurosurgery;  Laterality: N/A;  ANTERIOR CERVICAL DECOMPRESSION/DISCECTOMY FUSION, INTERBODY PROSTHESIS, PLATE/SCREWS, POSSIBLE CORPE   APPENDECTOMY     2010   COLONOSCOPY N/A 04/05/2022   Procedure: COLONOSCOPY;  Surgeon: Onita Elspeth Sharper, DO;  Location: [facility] ENDOSCOPY;  Service: Gastroenterology;  Laterality: N/A;   ESOPHAGOGASTRODUODENOSCOPY N/A 04/05/2022   Procedure: ESOPHAGOGASTRODUODENOSCOPY (EGD);  Surgeon: Onita Elspeth Sharper, DO;  Location: [facility] ENDOSCOPY;  Service: Gastroenterology;   Laterality: N/A;   OVARIAN CYST REMOVAL     TUBAL LIGATION     1998   Patient Active Problem List   Diagnosis Date Noted   Prediabetes 05/24/2023   Seizure (HCC) 05/23/2023   ASD (atrial septal defect): Possible 06/20/2021   Smoking history 06/20/2021   Patent foramen ovale 06/20/2021   Hypokalemia 06/19/2021   Atypical chest pain 06/18/2021   Seizure disorder (HCC) 03/20/2021   Hypertension associated with diabetes (HCC) 03/20/2021   Bipolar disorder, in partial remission, most recent episode depressed (HCC) 03/20/2021   Well woman exam with routine gynecological exam 01/16/2019   Cervical spondylosis with myelopathy and radiculopathy 01/26/2017   Depression 12/18/2015   ONSET DATE: 05/23/2023  REFERRING DIAG:  Diagnosis  R53.1 (ICD-10-CM) - Weakness    THERAPY DIAG:    Difficulty in walking, not elsewhere classified  Muscle weakness (generalized)  Other lack of coordination  Abnormality of gait  Rationale for Evaluation and Treatment: Rehabilitation  SUBJECTIVE:  SUBJECTIVE STATEMENT:    Pt states that she is feeling tired today. Pt states that she has had a busy weekend; pt states that navigating hills were challenging. Pt stated that she also had to take a different route d/t a busy sidewalk & she was fearful of falling. Pt reported that she did not wear brace for majority of weekend; mild pain/soreness following Friday night where she had to navigate hills, sidewalks attending grandson's football game.   Pt reported that she has appointment with MD 11/14/23, where she plans to discuss plan for return to work.   Pt reports drinking socially this weekend causing some imbalance; denies any falls.     Patient reports her goal is to improve her tolerance to be on the RLE more  throughout the day.    PERTINENT HISTORY:  Pt reports that she had Seizure and CVA  roughly 3-4 weeks ago followed by hospital admission. Reports that R side, UE and LE were both affected. States that she now has difficulty with gait, bathing, transfers, and dressing tasks due to increased R side weakness. Due to lack of payor difficulties, she Reports that she received no PT or OT  following recent hospitalizaiton. Neck surgery in 2018, following which she had to re-learn how to walk Reports that she started having weakness in 2017 after they brought me back to life.   PAIN: None today     PRECAUTIONS: Fall  RED FLAGS: None  WEIGHT BEARING RESTRICTIONS: limited by strength, no official restriction from Cspine surgery 2018  FALLS: Has patient fallen in last 6 months? No  LIVING ENVIRONMENT: Lives with: lives with their son Lives in: House/apartment Stairs: Yes: External: 2 steps; none deep steps that walker can fit  Has following equipment at home: Walker - 2 wheeled  PLOF: Independent with basic ADLs and Independent with household mobility without device  PATIENT GOALS: Get stronger. Walk without walker.   OBJECTIVE:  Note: Objective measures were completed at Evaluation unless otherwise noted.  DIAGNOSTIC FINDINGS:   IMPRESSION: 1. No acute intracranial abnormality. 2. No seizure focus identified. 3. Findings of chronic small vessel ischemia.  COGNITION: Overall cognitive status: Within functional limits for tasks assessed   SENSATION: Light touch: Impaired   COORDINATION: Finger to nose: mild ataxia and decreased ROM due to strength/pain  Ankle knee:   POSTURE: rounded shoulders, forward head, and elevated shoulders  LOWER EXTREMITY ROM:    To be assessed   LOWER EXTREMITY MMT:    MMT Right Eval Left Eval  Hip flexion 3 4-  Hip extension 3+ 4  Hip abduction 4- 4-  Hip adduction 4- 4-  Hip internal rotation    Hip external rotation    Knee flexion  2 4-  Knee extension 3 4  Ankle dorsiflexion 2 4-  Ankle plantarflexion    Ankle inversion    Ankle eversion    (Blank rows = not tested)  BED MOBILITY:  Not tested  TRANSFERS: Sit to stand: SBA  Assistive device utilized: Environmental consultant - 2 wheeled     Stand to sit: SBA  Assistive device utilized: Environmental consultant - 2 wheeled     Chair to chair: SBA  Assistive device utilized: Environmental consultant - 2 wheeled       GAIT: Findings: Gait Characteristics: step to pattern, step through pattern, decreased stride length, Right steppage, Right foot flat, and poor foot clearance- Right, Distance walked: inconsistent step to leading with RLE and then LLE, Assistive device utilized:Walker - 2 wheeled,  Level of assistance: CGA, and Comments: inconstitent step to pattern, inconsistent foot drop drag on the RLE. Noted to have midfoot contact in turns, but toe contact   FUNCTIONAL TESTS:  5 times sit to stand: 35.95 Timed up and go (TUG): 1:34 min with RW  10 meter walk test: 2:11 min with RW. 0.50m/s  Berg Balance Scale: 31  PATIENT SURVEYS:  ABC scale 18%                                                                                                                            TREATMENT DATE: 10/25/2023   Unless otherwise stated, CGA was provided and gait belt donned in order to ensure pt safety throughout session.  Pt entered session wearing her ASO ankle brace.   Completed following activities to build self efficacy with balance d/t pt report of decreased confidence with balance in busy environments & grabbing box/weight of higher surface:  Blaze pods 2x 5 min., random mode; placed at various heights including on top of fridge, counter height, & on floor (tap with LE) L hand yellow, R hand green for cognitive dual task Therapist intermittently randomly stepping in path of pt to mimic unpredictable environment; hurdles placed Second round, progressed to weighted ball at top of fridge, picking up ball from table height &  lifting overhead Pt reporting fatigue at end of activity Pt demonstrating increased confidence with turning, lateral stepping, SLS throughout activity Pt without significant LOB or instability throughout activity  Discussed POC: plan for return to work pending follow up with MD, scheduling for 3 remaining sessions to include 1 visit after appointment, continue to work on dynamic balance & other activities to prepare for return to work.   Pt also educated on progress with PT: improvements in confidence & tolerance of dynamic gait, decrease in ankle pain with increased activity without bracing compared to previous sessions.     PATIENT EDUCATION: Education details: POC. HEP. Pt educated throughout session about proper posture and technique with exercises. Improved exercise technique, movement at target joints, use of target muscles after min to mod verbal, visual, tactile cues.  Person educated: Patient Education method: Explanation Education comprehension: verbalized understanding  HOME EXERCISE PROGRAM:   Access Code: [email or web address] URL: [email or web address] Date: 07/28/2023 Prepared by: Connell Kiss  Exercises - Sit to Stand with Hands on Knees  - 1 x daily - 7 x weekly - 2 sets - 10 reps - Standing March with Counter Support  - 1 x daily - 4 x weekly - 3 sets - 10 reps - Heel Toe Raises with Counter Support  - 1 x daily - 4 x weekly - 3 sets - 10 reps - Backward Walking with Counter Support  - 1 x daily - 4 x weekly - 3 sets - 5 reps    GOALS: Goals reviewed with patient? Yes   SHORT TERM GOALS: Target date: 10/24/2023  Patient will be independent in home exercise program to  improve strength/mobility for better functional independence with ADLs. Baseline: initiated on 06/29/2023  07/28/2023: updated Goal status: INITIAL   LONG TERM GOALS: Target date: 12/05/2023  Patient will increase ABC score by equal to or greater than 15points to demonstrate statistically  significant improvement in mobility and quality of life.  Baseline: 18 09/12/2023: 40% Goal status: MET and CONTINUE   2.  Patient (> 46 years old) will complete five times sit to stand test in < 15 seconds indicating an increased LE strength and improved balance. Baseline: 35.95sec 09/12/2023: 16.14 seconds Goal status: IN PROGRESS  3.  Patient will increase Berg Balance score by > 6 points to demonstrate decreased fall risk during functional activities Baseline: to be completed  09/12/2023: 49/56 Goal status: IN PROGRESS  4.  Patient will increase 10 meter walk test to >0.53m/s as to improve gait speed for better community ambulation and to reduce fall risk. Baseline: 0.32m/s 09/12/2023: USING RW: Average Normal speed: 0.515 m/s & Average Fast speed: 0.69 m/s  --- WITHOUT AD: Average Normal speed: 0.785 m/s & Average Fast speed: 0.915 m/s with SBA for safety  Goal status: IN PROGRESS  5.  Patient will reduce timed up and go to <11 seconds to reduce fall risk and demonstrate improved transfer/gait ability. Baseline: 1:34 min with RW 09/12/2023: 23.185 seconds using RW with supervision/mod-I & 21.695 seconds, no AD, with SBA for safety  Goal status: IN PROGRESS  ASSESSMENT:  CLINICAL IMPRESSION:     Patient continuing to present with good motivation for completion of physical therapy activities. Completed all interventions without assistive device again this date. Today's session focusing on completing dynamic gait activities & discussing POC/scheduling for future sessions & patient progress. Pt will continue to benefit from skilled physical therapy intervention to address impairments, improve QOL, and attain therapy goals.      OBJECTIVE IMPAIRMENTS: Abnormal gait, decreased activity tolerance, decreased balance, decreased coordination, decreased endurance, decreased knowledge of condition, decreased knowledge of use of DME, decreased mobility, difficulty walking, decreased ROM,  decreased strength, impaired sensation, impaired UE functional use, improper body mechanics, and pain.   ACTIVITY LIMITATIONS: lifting, standing, squatting, sleeping, transfers, bed mobility, bathing, reach over head, hygiene/grooming, locomotion level, and caring for others  PARTICIPATION LIMITATIONS: cleaning, laundry, driving, shopping, community activity, occupation, and yard work  PERSONAL FACTORS: 3+ comorbidities: HTN, seizures, CVA, functional movement disorder are also affecting patient's functional outcome.   REHAB POTENTIAL: Excellent  CLINICAL DECISION MAKING: Evolving/moderate complexity  EVALUATION COMPLEXITY: Moderate  PLAN:  PT FREQUENCY: 1-2x/week  PT DURATION: 12 weeks  PLANNED INTERVENTIONS: 97164- PT Re-evaluation, 97750- Physical Performance Testing, 97110-Therapeutic exercises, 97530- Therapeutic activity, [id number]- Neuromuscular re-education, 97535- Self Care, [id number]- Manual therapy, [phone]- Gait training, [phone]- Orthotic Initial, [phone]- Orthotic/Prosthetic subsequent, [phone]- Aquatic Therapy, Patient/Family education, Balance training, Stair training, Taping, Dry Needling, Joint mobilization, Joint manipulation, Spinal manipulation, Spinal mobilization, Vestibular training, Visual/preceptual remediation/compensation, DME instructions, Cryotherapy, and Moist heat  PLAN FOR NEXT SESSION:     - Continue progressive dynamic gait training - LE strengthening to promote decreased knee hyperextension* - Reactive balance - simulating work environment in order to practice lateral and posterior stepping strategies; Blaze pods, unpredictable environment*  -continuing to advance with cognitive dual task, carrying tray, etc.  -Reaching for box/weight from elevated surface -assess HEP, update as appropriate  Chiquita Silvan, SPT Physical Therapy Student - [place]  [facility]    4:52 PM 10/25/23

## 2023-10-28 ENCOUNTER — Ambulatory Visit: Payer: MEDICAID | Admitting: Physical Therapy

## 2023-11-02 ENCOUNTER — Telehealth: Payer: Self-pay

## 2023-11-02 ENCOUNTER — Ambulatory Visit: Payer: MEDICAID | Admitting: Physical Therapy

## 2023-11-02 NOTE — Telephone Encounter (Signed)
 Called patient regarding missed PT appointment. Missed appointment d/t transportation issues, unable to call d/t phone issues. Confirmed next appointment on 11/11/23.

## 2023-11-04 ENCOUNTER — Ambulatory Visit: Payer: MEDICAID | Admitting: Physical Therapy

## 2023-11-09 ENCOUNTER — Ambulatory Visit: Payer: MEDICAID | Admitting: Physical Therapy

## 2023-11-11 ENCOUNTER — Ambulatory Visit: Payer: MEDICAID | Admitting: Physical Therapy

## 2023-11-11 ENCOUNTER — Other Ambulatory Visit: Payer: Self-pay

## 2023-11-11 DIAGNOSIS — K8020 Calculus of gallbladder without cholecystitis without obstruction: Secondary | ICD-10-CM | POA: Diagnosis not present

## 2023-11-11 DIAGNOSIS — R262 Difficulty in walking, not elsewhere classified: Secondary | ICD-10-CM

## 2023-11-11 DIAGNOSIS — I10 Essential (primary) hypertension: Secondary | ICD-10-CM | POA: Insufficient documentation

## 2023-11-11 DIAGNOSIS — R1010 Upper abdominal pain, unspecified: Secondary | ICD-10-CM | POA: Diagnosis present

## 2023-11-11 DIAGNOSIS — R269 Unspecified abnormalities of gait and mobility: Secondary | ICD-10-CM

## 2023-11-11 DIAGNOSIS — Z79899 Other long term (current) drug therapy: Secondary | ICD-10-CM | POA: Insufficient documentation

## 2023-11-11 DIAGNOSIS — Z8673 Personal history of transient ischemic attack (TIA), and cerebral infarction without residual deficits: Secondary | ICD-10-CM | POA: Insufficient documentation

## 2023-11-11 DIAGNOSIS — R278 Other lack of coordination: Secondary | ICD-10-CM

## 2023-11-11 DIAGNOSIS — M6281 Muscle weakness (generalized): Secondary | ICD-10-CM

## 2023-11-11 LAB — COMPREHENSIVE METABOLIC PANEL WITH GFR
ALT: 14 U/L (ref 0–44)
AST: 17 U/L (ref 15–41)
Albumin: 3.8 g/dL (ref 3.5–5.0)
Alkaline Phosphatase: 111 U/L (ref 38–126)
Anion gap: 12 (ref 5–15)
BUN: 13 mg/dL (ref 6–20)
CO2: 24 mmol/L (ref 22–32)
Calcium: 9.2 mg/dL (ref 8.9–10.3)
Chloride: 105 mmol/L (ref 98–111)
Creatinine, Ser: 0.61 mg/dL (ref 0.44–1.00)
GFR, Estimated: >60 mL/min (ref >60)
Glucose, Bld: 100 mg/dL — ABNORMAL HIGH (ref 70–99)
Potassium: 3.4 mmol/L — ABNORMAL LOW (ref 3.5–5.1)
Sodium: 141 mmol/L (ref 135–145)
Total Bilirubin: 0.3 mg/dL (ref 0.0–1.2)
Total Protein: 7.4 g/dL (ref 6.5–8.1)

## 2023-11-11 LAB — URINALYSIS, ROUTINE W REFLEX MICROSCOPIC
Bilirubin Urine: NEGATIVE
Glucose, UA: NEGATIVE mg/dL
Ketones, ur: NEGATIVE mg/dL
Leukocytes,Ua: NEGATIVE
Nitrite: NEGATIVE
Protein, ur: NEGATIVE mg/dL
Specific Gravity, Urine: 1.01 (ref 1.005–1.030)
pH: 6 (ref 5.0–8.0)

## 2023-11-11 LAB — CBC
HCT: 40 % (ref 36.0–46.0)
Hemoglobin: 13.2 g/dL (ref 12.0–15.0)
MCH: 25.6 pg — ABNORMAL LOW (ref 26.0–34.0)
MCHC: 33 g/dL (ref 30.0–36.0)
MCV: 77.5 fL — ABNORMAL LOW (ref 80.0–100.0)
Platelets: 217 K/uL (ref 150–400)
RBC: 5.16 MIL/uL — ABNORMAL HIGH (ref 3.87–5.11)
RDW: 14.8 % (ref 11.5–15.5)
WBC: 6.3 K/uL (ref 4.0–10.5)
nRBC: 0 % (ref 0.0–0.2)

## 2023-11-11 LAB — LIPASE, BLOOD: Lipase: 44 U/L (ref 11–51)

## 2023-11-11 NOTE — ED Triage Notes (Signed)
 Pt reports epigastric pain for the past week. Pt reports nausea and vomiting for the past 2 days. Pt has a history of IBS, pt reports has been following with GI and has undergone an EGD and esophageal dilatation has an appointment with new GI MD on Tuesday. Pt states pain she is experiencing at present is different from before which prompted her to come to ER

## 2023-11-11 NOTE — Therapy (Signed)
 OUTPATIENT PHYSICAL THERAPY TREATMENT   Patient Name: Heather Figueroa MRN: [id number] DOB:1970-08-01, 53 y.o., female Today's Date: 11/11/2023  PCP: Diedra Lame, MD REFERRING PROVIDER: Diedra Lame, MD  END OF SESSION:      PT End of Session - 11/11/23 1017     Visit Number 18    Number of Visits 48    Date for Recertification  12/05/23    Authorization Type 12 visits from 7/21 - 10/2 (9/19 is 10 of 12)    Progress Note Due on Visit 20    PT Start Time 1019    PT Stop Time 1100    PT Time Calculation (min) 41 min    Equipment Utilized During Treatment Gait belt    Activity Tolerance Other (comment)   Therapy session limited d/t GI upset & pt benefitting from therapeutic emotional support.   Behavior During Therapy [facility] for tasks assessed/performed                       Past Medical History:  Diagnosis Date   Anemia    Anxiety    Arthritis    Depression    Headache    Hypertension    Neuromuscular disorder (HCC)    Seizures (HCC)    pt has had 2 seizures in July 2018   Stroke [facility])    possible TIA in July 2018   Past Surgical History:  Procedure Laterality Date   ABDOMINAL HYSTERECTOMY     2002   ANTERIOR CERVICAL DECOMP/DISCECTOMY FUSION N/A 01/26/2017   Procedure: ANTERIOR CERVICAL DECOMPRESSION/DISCECTOMY FUSION, INTERBODY PROSTHESIS, PLATE/SCREWS, POSSIBLE CORPECTOMY CERVICAL FOUR- CERVICAL FIVE, CERVICAL FIVE- CERVICAL SIX, CERVICAL SIX- CERVICAL SEVEN;  Surgeon: Mavis Purchase, MD;  Location: [facility] OR;  Service: Neurosurgery;  Laterality: N/A;  ANTERIOR CERVICAL DECOMPRESSION/DISCECTOMY FUSION, INTERBODY PROSTHESIS, PLATE/SCREWS, POSSIBLE CORPE   APPENDECTOMY     2010   COLONOSCOPY N/A 04/05/2022   Procedure: COLONOSCOPY;  Surgeon: Onita Elspeth Sharper, DO;  Location: [facility] ENDOSCOPY;  Service: Gastroenterology;  Laterality: N/A;   ESOPHAGOGASTRODUODENOSCOPY N/A 04/05/2022   Procedure: ESOPHAGOGASTRODUODENOSCOPY (EGD);  Surgeon: Onita Elspeth Sharper, DO;  Location: [facility] ENDOSCOPY;  Service: Gastroenterology;  Laterality: N/A;   OVARIAN CYST REMOVAL     TUBAL LIGATION     1998   Patient Active Problem List   Diagnosis Date Noted   Prediabetes 05/24/2023   Seizure (HCC) 05/23/2023   ASD (atrial septal defect): Possible 06/20/2021   Smoking history 06/20/2021   Patent foramen ovale 06/20/2021   Hypokalemia 06/19/2021   Atypical chest pain 06/18/2021   Seizure disorder (HCC) 03/20/2021   Hypertension associated with diabetes (HCC) 03/20/2021   Bipolar disorder, in partial remission, most recent episode depressed (HCC) 03/20/2021   Well woman exam with routine gynecological exam 01/16/2019   Cervical spondylosis with myelopathy and radiculopathy 01/26/2017   Depression 12/18/2015   ONSET DATE: 05/23/2023  REFERRING DIAG:  Diagnosis  R53.1 (ICD-10-CM) - Weakness    THERAPY DIAG:    Difficulty in walking, not elsewhere classified  Abnormality of gait  Muscle weakness (generalized)  Other lack of coordination  Rationale for Evaluation and Treatment: Rehabilitation  SUBJECTIVE:  SUBJECTIVE STATEMENT:   Pt states that she is not feeling great today, reports having GI upset; plan to see the GI doctor 11/15/23. Pt reports feeling depressed, as her family member that was on hospice passed; she also had a classmate that passed. Follow up with primary care on 11/14/23.  Of note, on the date that her family member passed, pt reported that she had a loss of vision for a brief period & continued to feel off for the remainder of the day & the following day; pt believes this was a spiritual event.   No swelling or issues noted with the R ankle when she did have to go out in heels for funeral.   Pt reported that at one point d/t depression,  she felt like she may need to be admitted; reported that she has been seeing therapist.     Patient reports her goal is to improve her tolerance to be on the RLE more throughout the day.    PERTINENT HISTORY:  Pt reports that she had Seizure and CVA  roughly 3-4 weeks ago followed by hospital admission. Reports that R side, UE and LE were both affected. States that she now has difficulty with gait, bathing, transfers, and dressing tasks due to increased R side weakness. Due to lack of payor difficulties, she Reports that she received no PT or OT  following recent hospitalizaiton. Neck surgery in 2018, following which she had to re-learn how to walk Reports that she started having weakness in 2017 after they brought me back to life.   PAIN: None today     PRECAUTIONS: Fall  RED FLAGS: None  WEIGHT BEARING RESTRICTIONS: limited by strength, no official restriction from Cspine surgery 2018  FALLS: Has patient fallen in last 6 months? No  LIVING ENVIRONMENT: Lives with: lives with their son Lives in: House/apartment Stairs: Yes: External: 2 steps; none deep steps that walker can fit  Has following equipment at home: Walker - 2 wheeled  PLOF: Independent with basic ADLs and Independent with household mobility without device  PATIENT GOALS: Get stronger. Walk without walker.   OBJECTIVE:  Note: Objective measures were completed at Evaluation unless otherwise noted.  DIAGNOSTIC FINDINGS:   IMPRESSION: 1. No acute intracranial abnormality. 2. No seizure focus identified. 3. Findings of chronic small vessel ischemia.  COGNITION: Overall cognitive status: Within functional limits for tasks assessed   SENSATION: Light touch: Impaired   COORDINATION: Finger to nose: mild ataxia and decreased ROM due to strength/pain  Ankle knee:   POSTURE: rounded shoulders, forward head, and elevated shoulders  LOWER EXTREMITY ROM:    To be assessed   LOWER EXTREMITY MMT:    MMT  Right Eval Left Eval  Hip flexion 3 4-  Hip extension 3+ 4  Hip abduction 4- 4-  Hip adduction 4- 4-  Hip internal rotation    Hip external rotation    Knee flexion 2 4-  Knee extension 3 4  Ankle dorsiflexion 2 4-  Ankle plantarflexion    Ankle inversion    Ankle eversion    (Blank rows = not tested)  BED MOBILITY:  Not tested  TRANSFERS: Sit to stand: SBA  Assistive device utilized: Environmental consultant - 2 wheeled     Stand to sit: SBA  Assistive device utilized: Environmental consultant - 2 wheeled     Chair to chair: SBA  Assistive device utilized: Environmental consultant - 2 wheeled       GAIT: Findings: Gait Characteristics: step to pattern, step  through pattern, decreased stride length, Right steppage, Right foot flat, and poor foot clearance- Right, Distance walked: inconsistent step to leading with RLE and then LLE, Assistive device utilized:Walker - 2 wheeled, Level of assistance: CGA, and Comments: inconstitent step to pattern, inconsistent foot drop drag on the RLE. Noted to have midfoot contact in turns, but toe contact   FUNCTIONAL TESTS:  5 times sit to stand: 35.95 Timed up and go (TUG): 1:34 min with RW  10 meter walk test: 2:11 min with RW. 0.21m/s  Berg Balance Scale: 31  PATIENT SURVEYS:  ABC scale 18%                                                                                                                            TREATMENT DATE: 11/11/2023    Pt entered session without ASO ankle brace this date.  Unless otherwise stated, CGA was provided and gait belt donned in order to ensure pt safety throughout session.  Completed obstacle course for dynamic balance to include stepping over hurdles, stepping onto airex pads, walking with narrow BOS on airex beam, 2x ~20'.  Pt with greatest difficulty w/ walking narrow BOS on airex beam; completed additional 3x down & back.  Pt demonstrating strong ankle strategies with activity; requiring only CGA for safety with activity  Remainder of time focused on  providing therapeutic use of self to give pt emotional support d/t patient reports of depression w/ recent life events.    PATIENT EDUCATION: Education details: POC. HEP. Pt educated throughout session about proper posture and technique with exercises. Improved exercise technique, movement at target joints, use of target muscles after min to mod verbal, visual, tactile cues.  Person educated: Patient Education method: Explanation Education comprehension: verbalized understanding  HOME EXERCISE PROGRAM:   Access Code: [email or web address] URL: [email or web address] Date: 07/28/2023 Prepared by: Connell Kiss  Exercises - Sit to Stand with Hands on Knees  - 1 x daily - 7 x weekly - 2 sets - 10 reps - Standing March with Counter Support  - 1 x daily - 4 x weekly - 3 sets - 10 reps - Heel Toe Raises with Counter Support  - 1 x daily - 4 x weekly - 3 sets - 10 reps - Backward Walking with Counter Support  - 1 x daily - 4 x weekly - 3 sets - 5 reps    GOALS: Goals reviewed with patient? Yes   SHORT TERM GOALS: Target date: 10/24/2023  Patient will be independent in home exercise program to improve strength/mobility for better functional independence with ADLs. Baseline: initiated on 06/29/2023  07/28/2023: updated Goal status: INITIAL   LONG TERM GOALS: Target date: 12/05/2023  Patient will increase ABC score by equal to or greater than 15points to demonstrate statistically significant improvement in mobility and quality of life.  Baseline: 18 09/12/2023: 40% Goal status: MET and CONTINUE   2.  Patient (> 23 years old) will complete five times sit to  stand test in < 15 seconds indicating an increased LE strength and improved balance. Baseline: 35.95sec 09/12/2023: 16.14 seconds Goal status: IN PROGRESS  3.  Patient will increase Berg Balance score by > 6 points to demonstrate decreased fall risk during functional activities Baseline: to be completed  09/12/2023: 49/56 Goal  status: IN PROGRESS  4.  Patient will increase 10 meter walk test to >0.39m/s as to improve gait speed for better community ambulation and to reduce fall risk. Baseline: 0.34m/s 09/12/2023: USING RW: Average Normal speed: 0.515 m/s & Average Fast speed: 0.69 m/s  --- WITHOUT AD: Average Normal speed: 0.785 m/s & Average Fast speed: 0.915 m/s with SBA for safety  Goal status: IN PROGRESS  5.  Patient will reduce timed up and go to <11 seconds to reduce fall risk and demonstrate improved transfer/gait ability. Baseline: 1:34 min with RW 09/12/2023: 23.185 seconds using RW with supervision/mod-I & 21.695 seconds, no AD, with SBA for safety  Goal status: IN PROGRESS  ASSESSMENT:  CLINICAL IMPRESSION:   Patient presenting today with increased report of depression & GI upset d/t hx of gastroparesis. Therapy session focused on providing emotional support d/t pt reports of feeling depressed given recent life events that are significantly impacting her life & ability to participate in a therapy session focused on her mobility goals. Completed bout of dynamic balance training with obstacle course. Increased difficulty walking with narrow BOS on airex beam; pt using ankle strategies, requiring only CGA for safety with activity. Pt will continue to benefit from skilled physical therapy intervention to address impairments, improve QOL, and attain therapy goals.      OBJECTIVE IMPAIRMENTS: Abnormal gait, decreased activity tolerance, decreased balance, decreased coordination, decreased endurance, decreased knowledge of condition, decreased knowledge of use of DME, decreased mobility, difficulty walking, decreased ROM, decreased strength, impaired sensation, impaired UE functional use, improper body mechanics, and pain.   ACTIVITY LIMITATIONS: lifting, standing, squatting, sleeping, transfers, bed mobility, bathing, reach over head, hygiene/grooming, locomotion level, and caring for others  PARTICIPATION  LIMITATIONS: cleaning, laundry, driving, shopping, community activity, occupation, and yard work  PERSONAL FACTORS: 3+ comorbidities: HTN, seizures, CVA, functional movement disorder are also affecting patient's functional outcome.   REHAB POTENTIAL: Excellent  CLINICAL DECISION MAKING: Evolving/moderate complexity  EVALUATION COMPLEXITY: Moderate  PLAN:  PT FREQUENCY: 1-2x/week  PT DURATION: 12 weeks  PLANNED INTERVENTIONS: 97164- PT Re-evaluation, 97750- Physical Performance Testing, 97110-Therapeutic exercises, 97530- Therapeutic activity, 97112- Neuromuscular re-education, 97535- Self Care, [id number]- Manual therapy, [phone]- Gait training, [phone]- Orthotic Initial, [phone]- Orthotic/Prosthetic subsequent, [phone]- Aquatic Therapy, Patient/Family education, Balance training, Stair training, Taping, Dry Needling, Joint mobilization, Joint manipulation, Spinal manipulation, Spinal mobilization, Vestibular training, Visual/preceptual remediation/compensation, DME instructions, Cryotherapy, and Moist heat  PLAN FOR NEXT SESSION:     - D/C pending follow-up appointment w/ primary care MD to determine when pt will return to work [employer]ERG 5ST [employer]  Chiquita Silvan, SPT Physical Therapy Student - [facility]  [facility]    12:02 PM 11/11/23

## 2023-11-12 ENCOUNTER — Emergency Department
Admission: EM | Admit: 2023-11-12 | Discharge: 2023-11-12 | Disposition: A | Discharge status: Home / Self Care | Payer: MEDICAID | Attending: Emergency Medicine | Admitting: Emergency Medicine

## 2023-11-12 ENCOUNTER — Emergency Department: Payer: MEDICAID

## 2023-11-12 DIAGNOSIS — K8020 Calculus of gallbladder without cholecystitis without obstruction: Secondary | ICD-10-CM

## 2023-11-12 LAB — TROPONIN I (HIGH SENSITIVITY): Troponin I (High Sensitivity): 3 ng/L (ref <18)

## 2023-11-12 MED ORDER — ONDANSETRON HCL 4 MG/2ML IJ SOLN
4.0000 mg | Freq: Once | INTRAMUSCULAR | Status: AC
  Administered 2023-11-12: 4 mg via INTRAVENOUS
  Filled 2023-11-12: qty 2

## 2023-11-12 MED ORDER — ONDANSETRON 4 MG PO TBDP: 4.0000 mg | ORAL_TABLET | Freq: Four times a day (QID) | ORAL | 0 refills | Status: AC | PRN

## 2023-11-12 MED ORDER — TRAMADOL HCL 50 MG PO TABS: 50.0000 mg | ORAL_TABLET | Freq: Three times a day (TID) | ORAL | 0 refills | Status: AC | PRN

## 2023-11-12 MED ORDER — KETOROLAC TROMETHAMINE 30 MG/ML IJ SOLN
30.0000 mg | Freq: Once | INTRAMUSCULAR | Status: AC
  Administered 2023-11-12: 30 mg via INTRAVENOUS
  Filled 2023-11-12: qty 1

## 2023-11-12 MED ORDER — DICYCLOMINE HCL 10 MG PO CAPS
20.0000 mg | ORAL_CAPSULE | Freq: Once | ORAL | Status: AC
  Administered 2023-11-12: 20 mg via ORAL
  Filled 2023-11-12: qty 2

## 2023-11-12 MED ORDER — PANTOPRAZOLE SODIUM 40 MG IV SOLR
40.0000 mg | Freq: Once | INTRAVENOUS | Status: AC
  Administered 2023-11-12: 40 mg via INTRAVENOUS
  Filled 2023-11-12: qty 10

## 2023-11-12 MED ORDER — IOHEXOL 300 MG/ML  SOLN
100.0000 mL | Freq: Once | INTRAMUSCULAR | Status: AC | PRN
  Administered 2023-11-12: 100 mL via INTRAVENOUS

## 2023-11-12 MED ORDER — DICYCLOMINE HCL 20 MG PO TABS: 20.0000 mg | ORAL_TABLET | Freq: Three times a day (TID) | ORAL | 0 refills | Status: AC | PRN

## 2023-11-12 MED ORDER — MORPHINE SULFATE (PF) 4 MG/ML IV SOLN
4.0000 mg | Freq: Once | INTRAVENOUS | Status: AC
  Administered 2023-11-12: 4 mg via INTRAVENOUS
  Filled 2023-11-12: qty 1

## 2023-11-12 MED ORDER — SODIUM CHLORIDE 0.9 % IV BOLUS (SEPSIS)
1000.0000 mL | Freq: Once | INTRAVENOUS | Status: AC
  Administered 2023-11-12: 1000 mL via INTRAVENOUS

## 2023-11-12 MED ORDER — HYDROMORPHONE HCL 1 MG/ML IJ SOLN
1.0000 mg | Freq: Once | INTRAMUSCULAR | Status: AC
  Administered 2023-11-12: 1 mg via INTRAVENOUS
  Filled 2023-11-12: qty 1

## 2023-11-12 MED ORDER — METOCLOPRAMIDE HCL 5 MG/ML IJ SOLN
10.0000 mg | Freq: Once | INTRAMUSCULAR | Status: AC
  Administered 2023-11-12: 10 mg via INTRAVENOUS
  Filled 2023-11-12: qty 2

## 2023-11-12 MED ORDER — ALUM & MAG HYDROXIDE-SIMETH 200-200-20 MG/5ML PO SUSP
30.0000 mL | Freq: Once | ORAL | Status: AC
  Administered 2023-11-12: 30 mL via ORAL
  Filled 2023-11-12: qty 30

## 2023-11-12 NOTE — ED Provider Notes (Signed)
 [facility] Provider Note    Event Date/Time   First MD Initiated Contact with Figueroa 11/12/23 0020     (approximate)   History   Abdominal Pain   HPI  Heather Figueroa is a 53 y.o. female with history of depression, anxiety, hypertension, stroke, seizures who presents to the emergency department complaints of upper abdominal pain, nausea and vomiting that started today.  Has had previous hysterectomy, appendectomy, tubal ligation, ovarian cystectomy.  Denies any diarrhea.  Is passing gas.  No chest pain or shortness of breath.  Has never been diagnosed with gallstones.  She is concerned that she could have gastroparesis.  No history of diabetes.   History provided by Figueroa.    Past Medical History:  Diagnosis Date   Anemia    Anxiety    Arthritis    Depression    Headache    Hypertension    Neuromuscular disorder (HCC)    Seizures (HCC)    pt has had 2 seizures in July 2018   Stroke [facility])    possible TIA in July 2018    Past Surgical History:  Procedure Laterality Date   ABDOMINAL HYSTERECTOMY     2002   ANTERIOR CERVICAL DECOMP/DISCECTOMY FUSION N/A 01/26/2017   Procedure: ANTERIOR CERVICAL DECOMPRESSION/DISCECTOMY FUSION, INTERBODY PROSTHESIS, PLATE/SCREWS, POSSIBLE CORPECTOMY CERVICAL FOUR- CERVICAL FIVE, CERVICAL FIVE- CERVICAL SIX, CERVICAL SIX- CERVICAL SEVEN;  Surgeon: Mavis Purchase, MD;  Location: [facility] OR;  Service: Neurosurgery;  Laterality: N/A;  ANTERIOR CERVICAL DECOMPRESSION/DISCECTOMY FUSION, INTERBODY PROSTHESIS, PLATE/SCREWS, POSSIBLE CORPE   APPENDECTOMY     2010   COLONOSCOPY N/A 04/05/2022   Procedure: COLONOSCOPY;  Surgeon: Onita Elspeth Sharper, DO;  Location: [facility] ENDOSCOPY;  Service: Gastroenterology;  Laterality: N/A;   ESOPHAGOGASTRODUODENOSCOPY N/A 04/05/2022   Procedure: ESOPHAGOGASTRODUODENOSCOPY (EGD);  Surgeon: Onita Elspeth Sharper, DO;  Location: [facility] ENDOSCOPY;  Service: Gastroenterology;  Laterality: N/A;    OVARIAN CYST REMOVAL     TUBAL LIGATION     1998    MEDICATIONS:  Prior to Admission medications   Medication Sig Start Date End Date Taking? Authorizing Provider  amLODipine  (NORVASC ) 5 MG tablet Take 1 tablet by mouth daily at 12 noon. Figueroa not taking: Reported on 08/15/2023 10/28/21 05/24/23  [provider]  cholecalciferol (VITAMIN D3) 25 MCG (1000 UNIT) tablet Take 1,000 Units by mouth daily.    [provider]  estradiol  (ESTRACE ) 0.1 MG/GM vaginal cream Apply 1 gram per vagina every night for 2 weeks, then apply three times a week 08/15/23   Ajewole, Christana, MD  lamoTRIgine  (LAMICTAL ) 100 MG tablet Take 100 mg by mouth daily.    [provider]  linaclotide  (LINZESS ) 72 MCG capsule Take 72 mcg by mouth daily before breakfast.    [provider]  metFORMIN (GLUCOPHAGE) 500 MG tablet Take 2 tablets by mouth 2 (two) times daily with a meal. Figueroa not taking: Reported on 08/15/2023    [provider]  metoprolol  succinate (TOPROL  XL) 25 MG 24 hr tablet Take 1 tablet (25 mg total) by mouth daily at 10 pm. 07/17/21 05/23/24  Tolia, Sunit, DO  pantoprazole  (PROTONIX ) 40 MG tablet Take 40 mg by mouth every morning. 05/11/21   [provider]  QUEtiapine  (SEROQUEL ) 300 MG tablet Take 300 mg by mouth at bedtime.    [provider]  rizatriptan (MAXALT) 10 MG tablet Take 10 mg by mouth as needed for migraine.     [provider]  sucralfate (CARAFATE) 1 g  tablet Take 1 g by mouth 4 (four) times daily. 06/10/22 06/10/23  [provider]  traZODone  (DESYREL ) 100 MG tablet Take 150 mg by mouth at bedtime. 11/04/15   [provider]  venlafaxine  XR (EFFEXOR -XR) 150 MG 24 hr capsule Take 150 mg by mouth daily with breakfast. 11/05/15   [provider]    Physical Exam   Triage Vital Signs: ED Triage Vitals  Encounter Vitals Group     BP 11/11/23 2011 (!) 169/110     Girls Systolic BP Percentile --       Girls Diastolic BP Percentile --      Boys Systolic BP Percentile --      Boys Diastolic BP Percentile --      Pulse Rate 11/11/23 2011 80     Resp 11/11/23 2011 16     Temp 11/11/23 2011 98.4 F (36.9 C)     Temp Source 11/11/23 2011 Oral     SpO2 11/11/23 2011 100 %     Weight 11/11/23 2012 164 lb (74.4 kg)     Height 11/11/23 2012 5' 1 (1.549 m)     Head Circumference --      Peak Flow --      Pain Score 11/11/23 2012 10     Pain Loc --      Pain Education --      Exclude from Growth Chart --     Most recent vital signs: Vitals:   11/12/23 0016 11/12/23 0420  BP: (!) 160/88 139/83  Pulse: 74 80  Resp: 17 14  Temp: 98.9 F (37.2 C) 98.7 F (37.1 C)  SpO2: 100% 95%    CONSTITUTIONAL: Alert, responds appropriately to questions. Well-appearing; well-nourished HEAD: Normocephalic, atraumatic EYES: Conjunctivae clear, pupils appear equal, sclera nonicteric ENT: normal nose; moist mucous membranes NECK: Supple, normal ROM CARD: RRR; S1 and S2 appreciated RESP: Normal chest excursion without splinting or tachypnea; breath sounds clear and equal bilaterally; no wheezes, no rhonchi, no rales, no hypoxia or respiratory distress, speaking full sentences ABD/GI: Non-distended; soft, diffusely tender with intermittent guarding, no rebound BACK: The back appears normal EXT: Normal ROM in all joints; no deformity noted, no edema SKIN: Normal color for age and race; warm; no rash on exposed skin NEURO: Moves all extremities equally, normal speech PSYCH: The Figueroa's mood and manner are appropriate.   ED Results / Procedures / Treatments   LABS: (all labs ordered are listed, but only abnormal results are displayed) Labs Reviewed  COMPREHENSIVE METABOLIC PANEL WITH GFR - Abnormal; Notable for the following components:      Result Value   Potassium 3.4 (*)    Glucose, Bld 100 (*)    All other components within normal limits  CBC - Abnormal; Notable for the following  components:   RBC 5.16 (*)    MCV 77.5 (*)    MCH 25.6 (*)    All other components within normal limits  URINALYSIS, ROUTINE W REFLEX MICROSCOPIC - Abnormal; Notable for the following components:   Color, Urine YELLOW (*)    APPearance CLEAR (*)    Hgb urine dipstick SMALL (*)    Bacteria, UA RARE (*)    All other components within normal limits  LIPASE, BLOOD  TROPONIN I (HIGH SENSITIVITY)     EKG:  EKG Interpretation Date/Time:  Friday November 11 2023 20:18:36 EDT Ventricular Rate:  79 PR Interval:  222 QRS Duration:  96 QT Interval:  368 QTC Calculation: 421 R Axis:  68  Text Interpretation: Sinus rhythm with 1st degree A-V block Minimal voltage criteria for LVH, may be normal variant ( Cornell product ) Anterior infarct , age undetermined Abnormal ECG When compared with ECG of 23-May-2023 13:48, PREVIOUS ECG IS PRESENT Confirmed by Neomi Neptune [phone]) on 11/12/2023 2:38:29 AM         RADIOLOGY: My personal review and interpretation of imaging: Figueroa has gallstones on ultrasound.  CT of the abdomen pelvis unremarkable.  I have personally reviewed all radiology reports.   US  ABDOMEN LIMITED RUQ (LIVER/GB) Result Date: 11/12/2023 EXAM: Right Upper Quadrant Abdominal Ultrasound TECHNIQUE: Real-time ultrasonography of the right upper quadrant of the abdomen was performed. COMPARISON: None. CLINICAL HISTORY: Upper abdominal pain. FINDINGS: LIVER: Hyperechoic hepatic parenchyma, suggesting hepatic steatosis. No intrahepatic biliary ductal dilatation. No mass. BILIARY SYSTEM: Numerous gallstones (wall echo shadow sign), without associated sonographic findings to suggest acute cholecystitis. Negative sonographic Murphy's sign. Common bile duct measures 5 mm. OTHER: No right upper quadrant ascites. IMPRESSION: 1. Cholelithiasis, without sonographic findings of acute cholecystitis. 2. Hepatic steatosis. Electronically signed by: Pinkie Pebbles MD 11/12/2023 02:49 AM EDT RP  Workstation: [id number]   CT ABDOMEN PELVIS W CONTRAST Result Date: 11/12/2023 EXAM: CT ABDOMEN AND PELVIS WITH CONTRAST 11/12/2023 01:56:33 AM TECHNIQUE: CT of the abdomen and pelvis was performed with the administration of intravenous contrast. Multiplanar reformatted images are provided for review. Automated exposure control, iterative reconstruction, and/or weight-based adjustment of the mA/kV was utilized to reduce the radiation dose to as low as reasonably achievable. COMPARISON: 06/18/2021 CLINICAL HISTORY: Abdominal pain, acute, nonlocalized. Per ed notes; Pt reports epigastric pain for the past week. Pt reports nausea and vomiting for the past 2 days. Pt has a history of IBS, pt reports has been following with GI and has undergone an EGD and esophageal dilatation has an appointment with new GI MD on Tuesday. Pt states pain she is experiencing at present is different from before which prompted her to come to ER. FINDINGS: LOWER CHEST: Minimal atelectasis in the bilateral lower lobes. LIVER: The liver is unremarkable. GALLBLADDER AND BILE DUCTS: Gallbladder is unremarkable. No biliary ductal dilatation. SPLEEN: No acute abnormality. PANCREAS: No acute abnormality. ADRENAL GLANDS: No acute abnormality. KIDNEYS, URETERS AND BLADDER: No stones in the kidneys or ureters. No hydronephrosis. No perinephric or periureteral stranding. Urinary bladder is unremarkable. GI AND BOWEL: Stomach demonstrates no acute abnormality. There is no bowel obstruction. Mild sigmoid diverticulosis, without evidence of diverticulitis. Status post appendectomy. PERITONEUM AND RETROPERITONEUM: No ascites. No free air. VASCULATURE: Aorta is normal in caliber. LYMPH NODES: No lymphadenopathy. REPRODUCTIVE ORGANS: No acute abnormality. Status post hysterectomy. BONES AND SOFT TISSUES: No acute osseous abnormality. No focal soft tissue abnormality. IMPRESSION: 1. No acute abnormalities. Electronically signed by: Pinkie Pebbles MD  11/12/2023 02:03 AM EDT RP Workstation: [id number]     PROCEDURES:  Critical Care performed: No   CRITICAL CARE Performed by: Neptune Shaquilla Kehres   Total critical care time: 0 minutes  Critical care time was exclusive of separately billable procedures and treating other patients.  Critical care was necessary to treat or prevent imminent or life-threatening deterioration.  Critical care was time spent personally by me on the following activities: development of treatment plan with Figueroa and/or surrogate as well as nursing, discussions with consultants, evaluation of Figueroa's response to treatment, examination of Figueroa, obtaining history from Figueroa or surrogate, ordering and performing treatments and interventions, ordering and review of laboratory studies, ordering and review of radiographic studies, pulse oximetry and re-evaluation  of Figueroa's condition.   SABRA1-3 Lead EKG Interpretation  Performed by: Malya Cirillo, Josette SAILOR, DO Authorized by: Abbe Bula, Josette SAILOR, DO     Interpretation: normal     ECG rate:  80   ECG rate assessment: normal     Rhythm: sinus rhythm     Ectopy: none     Conduction: normal       IMPRESSION / MDM / ASSESSMENT AND PLAN / ED COURSE  I reviewed the triage vital signs and the nursing notes.    Figueroa here with complaints of generalized abdominal pain, worse in the upper abdomen.  The Figueroa is on the cardiac monitor to evaluate for evidence of arrhythmia and/or significant heart rate changes.   DIFFERENTIAL DIAGNOSIS (includes but not limited to):   Gallstones, gastritis, GERD, gastroparesis, cholecystitis, pancreatitis, cholangitis, choledocholithiasis, bowel obstruction, less likely ACS, PE.  Doubt dissection.   Figueroa's presentation is most consistent with acute presentation with potential threat to life or bodily function.   PLAN: Will obtain labs, urine, CT of the abdomen pelvis.  Discussed with Figueroa if CT is normal that we will likely need  to proceed with right upper quadrant ultrasound because this is not as sensitive to evaluate for gallbladder pathology.  Will start with CT though first given she is diffusely tender on my exam.  Will give pain medication, nausea medicine, IV fluids.   MEDICATIONS GIVEN IN ED: Medications  sodium chloride  0.9 % bolus 1,000 mL (0 mLs Intravenous Stopped 11/12/23 0409)  morphine  (PF) 4 MG/ML injection 4 mg (4 mg Intravenous Given 11/12/23 0137)  ondansetron  (ZOFRAN ) injection 4 mg (4 mg Intravenous Given 11/12/23 0137)  iohexol  (OMNIPAQUE ) 300 MG/ML solution 100 mL (100 mLs Intravenous Contrast Given 11/12/23 0148)  HYDROmorphone  (DILAUDID ) injection 1 mg (1 mg Intravenous Given 11/12/23 0248)  metoCLOPramide  (REGLAN ) injection 10 mg (10 mg Intravenous Given 11/12/23 0248)  pantoprazole  (PROTONIX ) injection 40 mg (40 mg Intravenous Given 11/12/23 0248)  ketorolac  (TORADOL ) 30 MG/ML injection 30 mg (30 mg Intravenous Given 11/12/23 0414)  alum & mag hydroxide-simeth (MAALOX/MYLANTA) 200-200-20 MG/5ML suspension 30 mL (30 mLs Oral Given 11/12/23 0414)  dicyclomine  (BENTYL ) capsule 20 mg (20 mg Oral Given 11/12/23 0414)     ED COURSE: CT scan reviewed and interpreted by myself and the radiologist and is unremarkable.  Labs show no leukocytosis.  Normal LFTs and lipase.  Urine shows no infection.  Troponin negative.  EKG nonischemic.  Will obtain right upper quadrant ultrasound.  Will give additional pain medicine.   Figueroa appears much more comfortable, smiling, laughing while talking on the phone.  Ultrasound reviewed and interpreted by myself and radiologist and shows gallstones but no signs of choledocholithiasis or cholecystitis.  She is tolerating p.o. here.  I feel she is safe for discharge home to follow-up with general surgery as an outpatient.  Recommended low-fat diet.  Will discharge with pain and nausea medicine.  Figueroa comfortable with this plan.   At this time, I do not feel there is any  life-threatening condition present. I reviewed all nursing notes, vitals, pertinent previous records.  All lab and urine results, EKGs, imaging ordered have been independently reviewed and interpreted by myself.  I reviewed all available radiology reports from any imaging ordered this visit.  Based on my assessment, I feel the Figueroa is safe to be discharged home without further emergent workup and can continue workup as an outpatient as needed. Discussed all findings, treatment plan as well as usual and customary return  precautions.  They verbalize understanding and are comfortable with this plan.  Outpatient follow-up has been provided as needed.  All questions have been answered.    CONSULTS:  none   OUTSIDE RECORDS REVIEWED: Reviewed last OB/GYN notes in June 2025.       FINAL CLINICAL IMPRESSION(S) / ED DIAGNOSES   Final diagnoses:  Gallstones     Rx / DC Orders   ED Discharge Orders          Ordered    traMADol  (ULTRAM ) 50 MG tablet  Every 8 hours PRN        11/12/23 0351    ondansetron  (ZOFRAN -ODT) 4 MG disintegrating tablet  Every 6 hours PRN        11/12/23 0351    dicyclomine  (BENTYL ) 20 MG tablet  Every 8 hours PRN        11/12/23 0351             Note:  This document was prepared using Dragon voice recognition software and may include unintentional dictation errors.   Sione Baumgarten, Josette SAILOR, DO 11/12/23 [phone]

## 2023-11-12 NOTE — Discharge Instructions (Addendum)

## 2023-11-12 NOTE — ED Notes (Signed)
 RUQ US , troponin   Kyrie Bun, Josette SAILOR, DO 11/12/23 0340

## 2023-11-17 ENCOUNTER — Ambulatory Visit: Payer: Self-pay | Admitting: General Surgery

## 2023-11-17 NOTE — H&P (View-Only) (Signed)
 History of Present Illness Heather Figueroa is a 53 year old female with cholelithiasis who presents with biliary colic.  She has been experiencing intermittent episodes of abdominal pain for approximately one week, initially attributing it to her gastroparesis. The pain escalated to a severity of nine out of ten, prompting a visit to the emergency room. She describes the pain as different from her usual symptoms, severe enough to require morphine  for relief. Despite medication, the pain persisted until she fell asleep.  The pain is localized to the upper abdomen, primarily in the center, and does not radiate to the back or sides.  She has a history of abdominal surgeries, including appendectomy and tubal ligation.  During her emergency room visit, diagnostic imaging, including a CT scan and ultrasound, revealed the presence of gallstones.  I personally evaluated the images.  She has been prescribed tramadol  for pain management.  She has a history of gastrointestinal issues, including gastroparesis, gastroenteritis, and irritable bowel syndrome (IBS). She also experiences constipation and has undergone endoscopy in the past due to swallowing difficulties. Her symptoms have been recurring since September, with similar episodes in April and June, each time with increasing severity.  Family history is significant for her mother's ileus related to cancer and her brother's similar condition.      PAST MEDICAL HISTORY:  Past Medical History:  Diagnosis Date  . Anemia   . Anxiety   . Arthritis   . Depression   . History of stroke   . Seizures (CMS/HHS-HCC)         PAST SURGICAL HISTORY:   Past Surgical History:  Procedure Laterality Date  . Colon @ [facility]  04/05/2022   Normal colon biopsy/Poor colon prep/FHx CC/Repeat 7yrs/SMR  . EGD @ [facility]  04/05/2022   Healing erosive gastritis/No Repeat/SMR  . APPENDECTOMY    . HYSTERECTOMY    . POSTERIOR LAMINECTOMY / DECOMPRESSION CERVICAL SPINE     . REMOVAL OVARIAN CYST    . TUBAL LIGATION           MEDICATIONS:  Outpatient Encounter Medications as of 11/17/2023  Medication Sig Dispense Refill  . cetirizine (ZYRTEC) 10 MG tablet Take 1 tablet (10 mg total) by mouth once daily 30 tablet 0  . dicyclomine  (BENTYL ) 20 mg tablet Take 20 mg by mouth    . lamoTRIgine  (LAMICTAL ) 100 MG tablet Take 1 tablet (100 mg total) by mouth 2 (two) times daily 180 tablet 1  . lubiprostone (AMITIZA) 24 MCG capsule Take 1 capsule (24 mcg total) by mouth 2 (two) times daily with meals 60 capsule 3  . metFORMIN (GLUCOPHAGE) 500 MG tablet Take 2 tablets (1,000 mg total) by mouth 2 (two) times daily with meals Patient not sure of the MG 360 tablet 3  . olmesartan -hydroCHLOROthiazide (BENICAR  HCT) 40-25 mg tablet Take 1 tablet by mouth once daily 90 tablet 3  . ondansetron  (ZOFRAN -ODT) 4 MG disintegrating tablet Take 4 mg by mouth every 6 (six) hours as needed for Nausea or Vomiting    . pantoprazole  (PROTONIX ) 40 MG DR tablet Take 1 tablet (40 mg total) by mouth 2 (two) times daily 60 tablet 6  . prazosin  (MINIPRESS ) 2 MG capsule Take by mouth    . QUEtiapine  (SEROQUEL ) 300 MG tablet Take 1 tablet (300 mg total) by mouth at bedtime 90 tablet 1  . rizatriptan (MAXALT) 10 MG tablet TAKE ONE TABLET BY MOUTH AT ONSET OF MIGRAINE. IF SYMPTOMS PERSIST, A SECOND DOSE MAY BE TAKEN IN 2 HOURS.  DO NOT EXCEED 2 DOSES IN A 24 HOUR PERIOD, UNLESS OTHERWISE INSTRUCTED BY YOUR PHYSICIAN    . traMADoL  (ULTRAM ) 50 mg tablet Take 50 mg by mouth every 8 (eight) hours as needed    . traZODone  (DESYREL ) 100 MG tablet Take 1.5 tablets (150 mg total) by mouth at bedtime 135 tablet 1  . triamcinolone (NASACORT AQ) 55 mcg nasal spray Place 2 sprays into both nostrils once daily 6.7 mL 12  . venlafaxine  (EFFEXOR -XR) 75 MG XR capsule Take 1 capsule (75 mg total) by mouth once daily 30 capsule 11  . amLODIPine  (NORVASC ) 5 MG tablet Take 1 tablet (5 mg total) by mouth once daily 90 tablet  3  . metoprolol  succinate (TOPROL -XL) 25 MG XL tablet Take 1 tablet (25 mg total) by mouth once daily 90 tablet 3   No facility-administered encounter medications on file as of 11/17/2023.     ALLERGIES:   Acetaminophen , Oxycodone -acetaminophen , Pentazocine, and Acetamide mea   SOCIAL HISTORY:  Social History   Socioeconomic History  . Marital status: Single  Tobacco Use  . Smoking status: Every Day    Current packs/day: 0.50    Average packs/day: 0.5 packs/day for 20.0 years (10.0 ttl pk-yrs)    Types: Cigarettes    Passive exposure: Never  . Smokeless tobacco: Never  Vaping Use  . Vaping status: Former  Substance and Sexual Activity  . Alcohol use: Not Currently    Comment: rarely  . Drug use: Never  . Sexual activity: Defer   Social Drivers of Health   Financial Resource Strain: Low Risk  (11/17/2023)   Overall Financial Resource Strain (CARDIA)   . Difficulty of Paying Living Expenses: Not hard at all  Recent Concern: Financial Resource Strain - High Risk (11/14/2023)   Overall Financial Resource Strain (CARDIA)   . Difficulty of Paying Living Expenses: Very hard  Food Insecurity: No Food Insecurity (11/17/2023)   Hunger Vital Sign   . Worried About Programme researcher, broadcasting/film/video in the Last Year: Never true   . Ran Out of Food in the Last Year: Never true  Recent Concern: Food Insecurity - Food Insecurity Present (11/14/2023)   Hunger Vital Sign   . Worried About Programme researcher, broadcasting/film/video in the Last Year: Sometimes true   . Ran Out of Food in the Last Year: Sometimes true  Transportation Needs: No Transportation Needs (11/17/2023)   PRAPARE - Transportation   . Lack of Transportation (Medical): No   . Lack of Transportation (Non-Medical): No  Recent Concern: Transportation Needs - Unmet Transportation Needs (11/14/2023)   PRAPARE - Transportation   . Lack of Transportation (Medical): Yes   . Lack of Transportation (Non-Medical): Yes    FAMILY HISTORY:  Family History  Problem  Relation Name Age of Onset  . Colon cancer Mother  76  . Diabetes Mother    . High blood pressure (Hypertension) Mother    . Heart disease Mother    . High blood pressure (Hypertension) Father    . Diabetes Father    . Migraines Sister    . Pulmonary embolism Sister    . Down syndrome Brother    . Gout Brother       GENERAL REVIEW OF SYSTEMS:   General ROS: negative for - chills, fatigue, fever, weight gain or weight loss Allergy and Immunology ROS: negative for - hives  Hematological and Lymphatic ROS: negative for - bleeding problems or bruising, negative for palpable nodes Endocrine ROS: negative for -  heat or cold intolerance, hair changes Respiratory ROS: negative for - cough, shortness of breath or wheezing Cardiovascular ROS: no chest pain or palpitations GI ROS: negative for nausea, vomiting, diarrhea, constipation.  Positive for abdominal pain Musculoskeletal ROS: negative for - joint swelling or muscle pain Neurological ROS: negative for - confusion, syncope Dermatological ROS: negative for pruritus and rash  PHYSICAL EXAM:  Vitals:   11/17/23 1521  BP: 113/84  Pulse: 83  .  Ht:157.5 cm (5' 2) Wt:68.9 kg (152 lb) ADJ:Anib surface area is 1.74 meters squared. Body mass index is 27.8 kg/m.SABRA   GENERAL: Alert, active, oriented x3  HEENT: Pupils equal reactive to light. Extraocular movements are intact. Sclera clear. Palpebral conjunctiva normal red color.Pharynx clear.  NECK: Supple with no palpable mass and no adenopathy.  LUNGS: Sound clear with no rales rhonchi or wheezes.  HEART: Regular rhythm S1 and S2 without murmur.  ABDOMEN: Soft and depressible, nontender with no palpable mass, no hepatomegaly.   EXTREMITIES: Well-developed well-nourished symmetrical with no dependent edema.  NEUROLOGICAL: Awake alert oriented, facial expression symmetrical, moving all extremities.   Results RADIOLOGY Abdominal ultrasound: Cholelithiasis without sonographic  findings of acute cholecystitis (11/12/2023) Abdominal CT scan: Normal (11/11/2023)    Assessment & Plan Symptomatic gallstones with chronic cholecystitis   She experiences intermittent severe abdominal pain in the upper abdomen radiating to the back, associated with gallstones. A recent ultrasound confirmed cholelithiasis without acute cholecystitis. Pain severity reached 9/10, prompting an ER visit. Symptoms are multifactorial, with contributions from gastroparesis, IBS, and constipation, but gallstones likely cause the severe pain episodes. Schedule a laparoscopic cholecystectomy to remove the gallbladder and prevent recurrence of severe pain episodes. Explain that while gallbladder removal may not alleviate all symptoms due to other underlying conditions, it should prevent severe pain episodes associated with gallstones. Discuss the laparoscopic cholecystectomy procedure, including four small incisions, gas insufflation, and removal of the gallbladder through a small incision. Inform her that most patients experience minimal lifestyle changes post-surgery as bile continues to flow from the liver to the intestine. The procedure is minimally invasive, with most patients discharged the same day and managing postoperative pain with over-the-counter medications. Provide a prescription for postoperative pain management as needed. Coordinate with the nurse to set up the surgery schedule.   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   Lucas Sjogren, MD  Electronically signed by Lucas Sjogren, MD

## 2023-11-21 ENCOUNTER — Encounter
Admission: RE | Admit: 2023-11-21 | Discharge: 2023-11-21 | Disposition: A | Discharge status: Home / Self Care | Payer: MEDICAID | Source: Ambulatory Visit | Attending: General Surgery | Admitting: General Surgery

## 2023-11-21 ENCOUNTER — Other Ambulatory Visit: Payer: Self-pay

## 2023-11-21 DIAGNOSIS — Z01818 Encounter for other preprocedural examination: Secondary | ICD-10-CM

## 2023-11-21 HISTORY — DX: Other cerebrovascular disease: I67.89

## 2023-11-21 HISTORY — DX: Fatty (change of) liver, not elsewhere classified: K76.0

## 2023-11-21 HISTORY — DX: Other spondylosis with myelopathy, cervical region: M47.12

## 2023-11-21 HISTORY — DX: Transient cerebral ischemic attack, unspecified: G45.9

## 2023-11-21 HISTORY — DX: Atrial septal defect, unspecified: Q21.10

## 2023-11-21 HISTORY — DX: Type 2 diabetes mellitus without complications: E11.9

## 2023-11-21 HISTORY — DX: Patent foramen ovale: Q21.12

## 2023-11-21 HISTORY — DX: Gastro-esophageal reflux disease without esophagitis: K21.9

## 2023-11-21 HISTORY — DX: Bipolar disorder, unspecified: F31.9

## 2023-11-21 HISTORY — DX: Calculus of bile duct without cholangitis or cholecystitis without obstruction: K80.50

## 2023-11-21 NOTE — Patient Instructions (Signed)
 Your procedure is scheduled on:11-23-23 Wednesday Report to the Registration Desk on the 1st floor of the [facility].Then proceed to the 2nd floor Surgery Desk To find out your arrival time, please call [phone] between 1PM - 3PM on:11-22-23 Tuesday If your arrival time is 6:00 am, do not arrive before that time as the [facility] entrance doors do not open until 6:00 am.  REMEMBER: Instructions that are not followed completely may result in serious medical risk, up to and including death; or upon the discretion of your surgeon and anesthesiologist your surgery may need to be rescheduled.  Do not eat food OR drink liquids after midnight the night before surgery.  No gum chewing or hard candies.  One week prior to surgery:Stop NOW (11-21-23) Stop Anti-inflammatories (NSAIDS) such as Advil , Aleve, Ibuprofen , Motrin , Naproxen, Naprosyn and Aspirin based products such as Excedrin, Goody's Powder, BC Powder. Stop ANY OVER THE COUNTER supplements until after surgery.  You may however, continue to take Tramado if needed for pain up until the day of surgery.  Stop metFORMIN (GLUCOPHAGE) 2 days prior to surgery-Last dose was on 11-19-23 Saturday (Do not take again until after your surgery)  Continue taking all of your other prescription medications up until the day of surgery.  ON THE DAY OF SURGERY ONLY TAKE THESE MEDICATIONS WITH SIPS OF WATER: -lamoTRIgine  (LAMICTAL )  -venlafaxine  XR (EFFEXOR -XR)   No Alcohol for 24 hours before or after surgery.  No Smoking including e-cigarettes for 24 hours before surgery.  No chewable tobacco products for at least 6 hours before surgery.  No nicotine  patches on the day of surgery.  Do not use any recreational drugs for at least a week (preferably 2 weeks) before your surgery.  Please be advised that the combination of cocaine and anesthesia may have negative outcomes, up to and including death. If you test positive for cocaine, your surgery will  be cancelled.  On the morning of surgery brush your teeth with toothpaste and water, you may rinse your mouth with mouthwash if you wish. Do not swallow any toothpaste or mouthwash.  Use CHG Soap as directed on instruction sheet.  Do not wear jewelry, make-up, hairpins, clips or nail polish.  For welded (permanent) jewelry: bracelets, anklets, waist bands, etc.  Please have this removed prior to surgery.  If it is not removed, there is a chance that hospital personnel will need to cut it off on the day of surgery.  Do not wear lotions, powders, or perfumes.   Do not shave body hair from the neck down 48 hours before surgery.  Contact lenses, hearing aids and dentures may not be worn into surgery.  Do not bring valuables to the hospital. [facility] is not responsible for any missing/lost belongings or valuables.   Notify your doctor if there is any change in your medical condition (cold, fever, infection).  Wear comfortable clothing (specific to your surgery type) to the hospital.  After surgery, you can help prevent lung complications by doing breathing exercises.  Take deep breaths and cough every 1-2 hours. Your doctor may order a device called an Incentive Spirometer to help you take deep breaths. When coughing or sneezing, hold a pillow firmly against your incision with both hands. This is called "splinting." Doing this helps protect your incision. It also decreases belly discomfort.  If you are being admitted to the hospital overnight, leave your suitcase in the car. After surgery it may be brought to your room.  In case  of increased patient census, it may be necessary for you, the patient, to continue your postoperative care in the Same Day Surgery department.  If you are being discharged the day of surgery, you will not be allowed to drive home. You will need a responsible individual to drive you home and stay with you for 24 hours after surgery.   If you are taking public  transportation, you will need to have a responsible individual with you.  Please call the Pre-admissions Testing Dept. at [phone] if you have any questions about these instructions.  Surgery Visitation Policy:  Patients having surgery or a procedure may have two visitors.  Children under the age of 1 must have an adult with them who is not the patient.                                                                                                             Preparing for Surgery with CHLORHEXIDINE  GLUCONATE (CHG) Soap  Chlorhexidine  Gluconate (CHG) Soap  o An antiseptic cleaner that kills germs and bonds with the skin to continue killing germs even after washing  o Used for showering the night before surgery and morning of surgery  Before surgery, you can play an important role by reducing the number of germs on your skin.  CHG (Chlorhexidine  gluconate) soap is an antiseptic cleanser which kills germs and bonds with the skin to continue killing germs even after washing.  Please do not use if you have an allergy to CHG or antibacterial soaps. If your skin becomes reddened/irritated stop using the CHG.  1. Shower the NIGHT BEFORE SURGERY and the MORNING OF SURGERY with CHG soap.  2. If you choose to wash your hair, wash your hair first as usual with your normal shampoo.  3. After shampooing, rinse your hair and body thoroughly to remove the shampoo.  4. Use CHG as you would any other liquid soap. You can apply CHG directly to the skin and wash gently with a scrungie or a clean washcloth.  5. Apply the CHG soap to your body only from the neck down. Do not use on open wounds or open sores. Avoid contact with your eyes, ears, mouth, and genitals (private parts). Wash face and genitals (private parts) with your normal soap.  6. Wash thoroughly, paying special attention to the area where your surgery will be performed.  7. Thoroughly rinse your body with warm water.  8. Do  not shower/wash with your normal soap after using and rinsing off the CHG soap.  9. Pat yourself dry with a clean towel.  10. Wear clean pajamas to bed the night before surgery.  12. Place clean sheets on your bed the night of your first shower and do not sleep with pets.  13. Shower again with the CHG soap on the day of surgery prior to arriving at the hospital.  14. Do not apply any deodorants/lotions/powders.  15. Please wear clean clothes to the hospital.   [employer] to address health-related social needs:  [email or web address]

## 2023-11-23 ENCOUNTER — Other Ambulatory Visit: Payer: Self-pay

## 2023-11-23 ENCOUNTER — Other Ambulatory Visit: Payer: Self-pay | Admitting: General Surgery

## 2023-11-23 ENCOUNTER — Ambulatory Visit: Payer: MEDICAID | Admitting: Urgent Care

## 2023-11-23 ENCOUNTER — Ambulatory Visit
Admission: RE | Admit: 2023-11-23 | Discharge: 2023-11-23 | Disposition: A | Discharge status: Home / Self Care | Payer: MEDICAID | Attending: General Surgery | Admitting: General Surgery

## 2023-11-23 ENCOUNTER — Encounter: Payer: Self-pay | Admitting: General Surgery

## 2023-11-23 ENCOUNTER — Encounter
Admission: RE | Disposition: A | Discharge status: Home / Self Care | Payer: Self-pay | Source: Home / Self Care | Attending: General Surgery

## 2023-11-23 DIAGNOSIS — K219 Gastro-esophageal reflux disease without esophagitis: Secondary | ICD-10-CM | POA: Diagnosis not present

## 2023-11-23 DIAGNOSIS — K3184 Gastroparesis: Secondary | ICD-10-CM | POA: Insufficient documentation

## 2023-11-23 DIAGNOSIS — F17210 Nicotine dependence, cigarettes, uncomplicated: Secondary | ICD-10-CM | POA: Insufficient documentation

## 2023-11-23 DIAGNOSIS — F319 Bipolar disorder, unspecified: Secondary | ICD-10-CM | POA: Diagnosis not present

## 2023-11-23 DIAGNOSIS — Z79899 Other long term (current) drug therapy: Secondary | ICD-10-CM | POA: Diagnosis not present

## 2023-11-23 DIAGNOSIS — K8010 Calculus of gallbladder with chronic cholecystitis without obstruction: Secondary | ICD-10-CM | POA: Diagnosis present

## 2023-11-23 DIAGNOSIS — Z800 Family history of malignant neoplasm of digestive organs: Secondary | ICD-10-CM | POA: Insufficient documentation

## 2023-11-23 DIAGNOSIS — R1310 Dysphagia, unspecified: Secondary | ICD-10-CM | POA: Insufficient documentation

## 2023-11-23 DIAGNOSIS — R569 Unspecified convulsions: Secondary | ICD-10-CM | POA: Insufficient documentation

## 2023-11-23 DIAGNOSIS — R519 Headache, unspecified: Secondary | ICD-10-CM | POA: Insufficient documentation

## 2023-11-23 DIAGNOSIS — F419 Anxiety disorder, unspecified: Secondary | ICD-10-CM | POA: Insufficient documentation

## 2023-11-23 DIAGNOSIS — D649 Anemia, unspecified: Secondary | ICD-10-CM | POA: Insufficient documentation

## 2023-11-23 DIAGNOSIS — Z8673 Personal history of transient ischemic attack (TIA), and cerebral infarction without residual deficits: Secondary | ICD-10-CM | POA: Insufficient documentation

## 2023-11-23 DIAGNOSIS — Z01818 Encounter for other preprocedural examination: Secondary | ICD-10-CM

## 2023-11-23 DIAGNOSIS — Z7984 Long term (current) use of oral hypoglycemic drugs: Secondary | ICD-10-CM | POA: Diagnosis not present

## 2023-11-23 DIAGNOSIS — K828 Other specified diseases of gallbladder: Secondary | ICD-10-CM | POA: Diagnosis not present

## 2023-11-23 DIAGNOSIS — I10 Essential (primary) hypertension: Secondary | ICD-10-CM | POA: Insufficient documentation

## 2023-11-23 DIAGNOSIS — K589 Irritable bowel syndrome without diarrhea: Secondary | ICD-10-CM | POA: Insufficient documentation

## 2023-11-23 HISTORY — PX: INDOCYANINE GREEN FLUORESCENCE IMAGING (ICG): SHX7595

## 2023-11-23 LAB — GLUCOSE, CAPILLARY
Glucose-Capillary: 105 mg/dL — ABNORMAL HIGH (ref 70–99)
Glucose-Capillary: 149 mg/dL — ABNORMAL HIGH (ref 70–99)

## 2023-11-23 SURGERY — CHOLECYSTECTOMY, ROBOT-ASSISTED, LAPAROSCOPIC
Anesthesia: General | Site: Abdomen

## 2023-11-23 MED ORDER — CHLORHEXIDINE GLUCONATE 0.12 % MT SOLN
15.0000 mL | Freq: Once | OROMUCOSAL | Status: AC
  Administered 2023-11-23: 15 mL via OROMUCOSAL

## 2023-11-23 MED ORDER — ONDANSETRON HCL 4 MG/2ML IJ SOLN
INTRAMUSCULAR | Status: DC | PRN
Start: 2023-11-23 — End: 2023-11-23
  Administered 2023-11-23: 4 mg via INTRAVENOUS

## 2023-11-23 MED ORDER — INDOCYANINE GREEN 25 MG IV SOLR
1.2500 mg | Freq: Once | INTRAVENOUS | Status: AC
  Administered 2023-11-23: 1.25 mg via INTRAVENOUS

## 2023-11-23 MED ORDER — CEFAZOLIN SODIUM-DEXTROSE 2-4 GM/100ML-% IV SOLN
2.0000 g | INTRAVENOUS | Status: AC
  Administered 2023-11-23: 2 g via INTRAVENOUS

## 2023-11-23 MED ORDER — LIDOCAINE HCL (CARDIAC) PF 100 MG/5ML IV SOSY
PREFILLED_SYRINGE | INTRAVENOUS | Status: DC | PRN
  Administered 2023-11-23: 100 mg via INTRAVENOUS

## 2023-11-23 MED ORDER — ROCURONIUM BROMIDE 10 MG/ML (PF) SYRINGE
PREFILLED_SYRINGE | INTRAVENOUS | Status: AC
  Filled 2023-11-23: qty 10

## 2023-11-23 MED ORDER — DEXAMETHASONE SODIUM PHOSPHATE 10 MG/ML IJ SOLN
INTRAMUSCULAR | Status: AC
  Filled 2023-11-23: qty 1

## 2023-11-23 MED ORDER — PHENYLEPHRINE 80 MCG/ML (10ML) SYRINGE FOR IV PUSH (FOR BLOOD PRESSURE SUPPORT)
PREFILLED_SYRINGE | INTRAVENOUS | Status: AC
Start: 2023-11-23 — End: 2023-11-23
  Filled 2023-11-23: qty 10

## 2023-11-23 MED ORDER — PROPOFOL 10 MG/ML IV BOLUS
INTRAVENOUS | Status: AC
Start: 2023-11-23 — End: 2023-11-23
  Filled 2023-11-23: qty 20

## 2023-11-23 MED ORDER — KETOROLAC TROMETHAMINE 30 MG/ML IJ SOLN
INTRAMUSCULAR | Status: AC
  Filled 2023-11-23: qty 1

## 2023-11-23 MED ORDER — CHLORHEXIDINE GLUCONATE 0.12 % MT SOLN
OROMUCOSAL | Status: AC
  Filled 2023-11-23: qty 15

## 2023-11-23 MED ORDER — ONDANSETRON HCL 4 MG/2ML IJ SOLN
INTRAMUSCULAR | Status: AC
Start: 2023-11-23 — End: 2023-11-23
  Filled 2023-11-23: qty 2

## 2023-11-23 MED ORDER — BUPIVACAINE-EPINEPHRINE 0.25% -1:200000 IJ SOLN
INTRAMUSCULAR | Status: DC | PRN
  Administered 2023-11-23: 20 mL

## 2023-11-23 MED ORDER — FENTANYL CITRATE (PF) 100 MCG/2ML IJ SOLN
INTRAMUSCULAR | Status: DC | PRN
  Administered 2023-11-23 (×2): 50 ug via INTRAVENOUS

## 2023-11-23 MED ORDER — ORAL CARE MOUTH RINSE: 15.0000 mL | Freq: Once | OROMUCOSAL | Status: AC

## 2023-11-23 MED ORDER — DEXAMETHASONE SODIUM PHOSPHATE 10 MG/ML IJ SOLN
INTRAMUSCULAR | Status: DC | PRN
  Administered 2023-11-23: 5 mg via INTRAVENOUS

## 2023-11-23 MED ORDER — SUGAMMADEX SODIUM 200 MG/2ML IV SOLN
INTRAVENOUS | Status: DC | PRN
  Administered 2023-11-23: 300 mg via INTRAVENOUS

## 2023-11-23 MED ORDER — ROCURONIUM BROMIDE 100 MG/10ML IV SOLN
INTRAVENOUS | Status: DC | PRN
  Administered 2023-11-23: 60 mg via INTRAVENOUS

## 2023-11-23 MED ORDER — LIDOCAINE HCL (PF) 2 % IJ SOLN
INTRAMUSCULAR | Status: AC
Start: 2023-11-23 — End: 2023-11-23
  Filled 2023-11-23: qty 5

## 2023-11-23 MED ORDER — CEFAZOLIN SODIUM-DEXTROSE 2-4 GM/100ML-% IV SOLN
INTRAVENOUS | Status: AC
Start: 2023-11-23 — End: 2023-11-23
  Filled 2023-11-23: qty 100

## 2023-11-23 MED ORDER — KETOROLAC TROMETHAMINE 30 MG/ML IJ SOLN
INTRAMUSCULAR | Status: DC | PRN
  Administered 2023-11-23: 30 mg via INTRAVENOUS

## 2023-11-23 MED ORDER — TRAMADOL HCL 50 MG PO TABS: 50.0000 mg | ORAL_TABLET | Freq: Three times a day (TID) | ORAL | Status: DC | PRN

## 2023-11-23 MED ORDER — PROPOFOL 10 MG/ML IV BOLUS
INTRAVENOUS | Status: DC | PRN
  Administered 2023-11-23: 120 mg via INTRAVENOUS

## 2023-11-23 MED ORDER — MIDAZOLAM HCL 2 MG/2ML IJ SOLN
INTRAMUSCULAR | Status: DC | PRN
  Administered 2023-11-23: 2 mg via INTRAVENOUS

## 2023-11-23 MED ORDER — TRAMADOL HCL 50 MG PO TABS: 50.0000 mg | ORAL_TABLET | Freq: Four times a day (QID) | ORAL | 0 refills | Status: AC | PRN

## 2023-11-23 MED ORDER — BUPIVACAINE-EPINEPHRINE (PF) 0.25% -1:200000 IJ SOLN
INTRAMUSCULAR | Status: AC
  Filled 2023-11-23: qty 30

## 2023-11-23 MED ORDER — SODIUM CHLORIDE 0.9 % IV SOLN: INTRAVENOUS | Status: DC

## 2023-11-23 MED ORDER — HYDROMORPHONE HCL 1 MG/ML IJ SOLN
INTRAMUSCULAR | Status: AC
  Filled 2023-11-23: qty 1

## 2023-11-23 MED ORDER — HYDROMORPHONE HCL 1 MG/ML IJ SOLN
0.2500 mg | INTRAMUSCULAR | Status: DC | PRN
  Administered 2023-11-23 (×2): 0.5 mg via INTRAVENOUS

## 2023-11-23 MED ORDER — PHENYLEPHRINE 80 MCG/ML (10ML) SYRINGE FOR IV PUSH (FOR BLOOD PRESSURE SUPPORT)
PREFILLED_SYRINGE | INTRAVENOUS | Status: DC | PRN
  Administered 2023-11-23: 80 ug via INTRAVENOUS

## 2023-11-23 SURGICAL SUPPLY — 42 items
BAG PRESSURE INF REUSE 1000 (BAG) IMPLANT
CANNULA REDUCER 12-8 DVNC XI (CANNULA) ×2 IMPLANT
CAUTERY HOOK MNPLR 1.6 DVNC XI (INSTRUMENTS) ×2 IMPLANT
CLIP LIGATING HEM O LOK PURPLE (MISCELLANEOUS) IMPLANT
CLIP LIGATING HEMO O LOK GREEN (MISCELLANEOUS) ×2 IMPLANT
DEFOGGER SCOPE WARM SEASHARP (MISCELLANEOUS) ×2 IMPLANT
DERMABOND ADVANCED .7 DNX12 (GAUZE/BANDAGES/DRESSINGS) ×2 IMPLANT
DRAPE ARM DVNC X/XI (DISPOSABLE) ×8 IMPLANT
DRAPE C-ARM XRAY 36X54 (DRAPES) IMPLANT
DRAPE COLUMN DVNC XI (DISPOSABLE) ×2 IMPLANT
ELECTRODE REM PT RTRN 9FT ADLT (ELECTROSURGICAL) ×2 IMPLANT
FORCEPS BPLR 8 MD DVNC XI (FORCEP) IMPLANT
FORCEPS BPLR FENES DVNC XI (FORCEP) ×2 IMPLANT
FORCEPS PROGRASP DVNC XI (FORCEP) ×2 IMPLANT
GLOVE BIO SURGEON STRL SZ 6.5 (GLOVE) ×4 IMPLANT
GLOVE BIOGEL PI IND STRL 6.5 (GLOVE) ×4 IMPLANT
GLOVE SURG SYN 6.5 PF PI (GLOVE) ×4 IMPLANT
GOWN STRL REUS W/ TWL LRG LVL3 (GOWN DISPOSABLE) ×8 IMPLANT
GRASPER SUT TROCAR 14GX15 (MISCELLANEOUS) ×2 IMPLANT
IRRIGATOR SUCT 8 DISP DVNC XI (IRRIGATION / IRRIGATOR) IMPLANT
IV 0.9% NACL 1000 ML (IV SOLUTION) IMPLANT
IV CATH ANGIO 12GX3 LT BLUE (NEEDLE) IMPLANT
KIT PINK PAD W/HEAD ARM REST (MISCELLANEOUS) ×2 IMPLANT
LABEL OR SOLS (LABEL) ×2 IMPLANT
MANIFOLD NEPTUNE II (INSTRUMENTS) IMPLANT
NDL HYPO 22X1.5 SAFETY MO (MISCELLANEOUS) ×2 IMPLANT
NDL INSUFFLATION 14GA 120MM (NEEDLE) ×2 IMPLANT
NEEDLE HYPO 22X1.5 SAFETY MO (MISCELLANEOUS) ×2 IMPLANT
NEEDLE INSUFFLATION 14GA 120MM (NEEDLE) ×2 IMPLANT
NS IRRIG 500ML POUR BTL (IV SOLUTION) ×2 IMPLANT
OBTURATOR OPTICALSTD 8 DVNC (TROCAR) ×2 IMPLANT
PACK LAP CHOLECYSTECTOMY (MISCELLANEOUS) ×2 IMPLANT
SEAL UNIV 5-12 XI (MISCELLANEOUS) ×8 IMPLANT
SET TUBE SMOKE EVAC HIGH FLOW (TUBING) ×2 IMPLANT
SOLUTION ELECTROSURG ANTI STCK (MISCELLANEOUS) ×2 IMPLANT
SPIKE FLUID TRANSFER (MISCELLANEOUS) ×4 IMPLANT
SPONGE T-LAP 4X18 ~~LOC~~+RFID (SPONGE) IMPLANT
SUT VICRYL 0 UR6 27IN ABS (SUTURE) ×2 IMPLANT
SUTURE MNCRL 4-0 27XMF (SUTURE) ×2 IMPLANT
SYSTEM BAG RETRIEVAL 10MM (BASKET) ×2 IMPLANT
TRAP FLUID SMOKE EVACUATOR (MISCELLANEOUS) IMPLANT
WATER STERILE IRR 500ML POUR (IV SOLUTION) ×2 IMPLANT

## 2023-11-23 NOTE — Transfer of Care (Signed)
 Immediate Anesthesia Transfer of Care Note  Patient: Heather Figueroa  Procedure(s) Performed: CHOLECYSTECTOMY, ROBOT-ASSISTED, LAPAROSCOPIC (Abdomen) INDOCYANINE GREEN FLUORESCENCE IMAGING (ICG)  Patient Location: PACU  Anesthesia Type:General  Level of Consciousness: awake, alert , and drowsy  Airway & Oxygen Therapy: Patient Spontanous Breathing and Patient connected to face mask oxygen  Post-op Assessment: Report given to RN and Post -op Vital signs reviewed and stable  Post vital signs: Reviewed and stable  Last Vitals:  Vitals Value Taken Time  BP 172/88 11/23/23 13:19  Temp 35.8 1319  Pulse 72 11/23/23 13:21  Resp 14 11/23/23 13:21  SpO2 100 % 11/23/23 13:21  Vitals shown include unfiled device data.  Last Pain:  Vitals:   11/23/23 1118  TempSrc: Temporal  PainSc: 7          Complications: No notable events documented.

## 2023-11-23 NOTE — Interval H&P Note (Signed)
 History and Physical Interval Note:  11/23/2023 11:40 AM  Heather Figueroa  has presented today for surgery, with the diagnosis of K80.10 CCC.  The various methods of treatment have been discussed with the patient and family. After consideration of risks, benefits and other options for treatment, the patient has consented to  Procedure(s): CHOLECYSTECTOMY, ROBOT-ASSISTED, LAPAROSCOPIC (N/A) as a surgical intervention.  The patient's history has been reviewed, patient examined, no change in status, stable for surgery.  I have reviewed the patient's chart and labs.  Questions were answered to the patient's satisfaction.     Lucas Sjogren

## 2023-11-23 NOTE — Discharge Instructions (Signed)

## 2023-11-23 NOTE — Op Note (Signed)
 Preoperative diagnosis: Cholelithiasis  Postoperative diagnosis: Same  Procedure: Robotic Assisted Laparoscopic Cholecystectomy.   Anesthesia: GETA   Surgeon: Dr. Cesar Coe  Wound Classification: Clean Contaminated  Indications: Patient is a 53 y.o. female developed right upper quadrant pain and on workup was found to have cholelithiasis with biliary colic. Robotic Assisted Laparoscopic cholecystectomy was elected.  Findings:  Critical view of safety achieved Cystic duct and artery identified, ligated and divided Adequate hemostasis         Description of procedure: The patient was placed on the operating table in the supine position. General anesthesia was induced. A time-out was completed verifying correct patient, procedure, site, positioning, and implant(s) and/or special equipment prior to beginning this procedure. An orogastric tube was placed. The abdomen was prepped and draped in the usual sterile fashion.  An incision was made in a natural skin line below the umbilicus.  The fascia was elevated and the Veress needle inserted. Proper position was confirmed by aspiration and saline meniscus test.  The abdomen was insufflated with carbon dioxide to a pressure of 15 mmHg. The patient tolerated insufflation well. A 8-mm trocar was then inserted in optiview fashion.  The laparoscope was inserted and the abdomen inspected. No injuries from initial trocar placement were noted. Additional trocars were then inserted in the following locations: an 8-mm trocar in the left lateral abdomen, and another two 8-mm trocars to the right side of the abdomen 5 cm appart. The umbilical trocar was changed to a 12 mm trocar all under direct visualization. The abdomen was inspected and no abnormalities were found. The table was placed in the reverse Trendelenburg position with the right side up. The robotic arms were docked and target anatomy identified. Instrument inserted under direct  visualization.  Filmy adhesions between the gallbladder and omentum, duodenum and transverse colon were lysed with electrocautery. The dome of the gallbladder was grasped with a prograsp and retracted over the dome of the liver. The infundibulum was also grasped with an atraumatic grasper and retracted toward the right lower quadrant. This maneuver exposed Calot's triangle. The peritoneum overlying the gallbladder infundibulum was then incised and the cystic duct and cystic artery identified and circumferentially dissected. Critical view of safety reviewed before ligating any structure. Firefly images taken to visualize biliary ducts. The cystic duct and cystic artery were then doubly clipped and divided close to the gallbladder.  The gallbladder was then dissected from its peritoneal attachments by electrocautery. Hemostasis was checked and the gallbladder and contained stones were removed using an endoscopic retrieval bag. The gallbladder was passed off the table as a specimen. with saline and hemostasis was obtained. There was no evidence of bleeding from the gallbladder fossa or cystic artery or leakage of the bile from the cystic duct stump. Secondary trocars were removed under direct vision. No bleeding was noted. The robotic arms were undoked. The scope was withdrawn and the umbilical trocar removed. The abdomen was allowed to collapse. The fascia of the 12mm trocar sites was closed with figure-of-eight 0 vicryl sutures. The skin was closed with subcuticular sutures of 4-0 monocryl and topical skin adhesive. The orogastric tube was removed.  The patient tolerated the procedure well and was taken to the postanesthesia care unit in stable condition.   Specimen: Gallbladder  Complications: None  EBL: 5 mL

## 2023-11-23 NOTE — Anesthesia Preprocedure Evaluation (Signed)
 Anesthesia Evaluation  Patient identified by MRN, date of birth, ID band Patient awake    Reviewed: Allergy & Precautions, NPO status , Patient's Chart, lab work & pertinent test results  History of Anesthesia Complications Negative for: history of anesthetic complications  Airway Mallampati: III  TM Distance: >3 FB Neck ROM: full    Dental  (+) Chipped   Pulmonary neg pulmonary ROS, Current Smoker and Patient abstained from smoking.   Pulmonary exam normal        Cardiovascular hypertension, negative cardio ROS Normal cardiovascular exam     Neuro/Psych  Headaches, Seizures -, Poorly Controlled,  PSYCHIATRIC DISORDERS Anxiety Depression Bipolar Disorder   TIA Neuromuscular disease    GI/Hepatic Neg liver ROS,GERD  Medicated,,  Endo/Other  negative endocrine ROSdiabetes    Renal/GU      Musculoskeletal   Abdominal   Peds  Hematology  (+) Blood dyscrasia, anemia   Anesthesia Other Findings Past Medical History: No date: Anemia No date: Anxiety No date: Arthritis No date: Atrial septal defect No date: Biliary colic No date: Bipolar disorder (HCC) No date: Cerebral microvascular disease No date: Cervical spondylosis with myelopathy and radiculopathy No date: Depression No date: DM (diabetes mellitus), type 2 (HCC) No date: GERD (gastroesophageal reflux disease) No date: Headache No date: Hepatic steatosis No date: Hypertension No date: PFO (patent foramen ovale) No date: Seizures (HCC)     Comment:  last seizure in August 2025 No date: TIA (transient ischemic attack)     Comment:  possibly in 2017  Past Surgical History: No date: ABDOMINAL HYSTERECTOMY     Comment:  2002 01/26/2017: ANTERIOR CERVICAL DECOMP/DISCECTOMY FUSION; N/A     Comment:  Procedure: ANTERIOR CERVICAL DECOMPRESSION/DISCECTOMY               FUSION, INTERBODY PROSTHESIS, PLATE/SCREWS, POSSIBLE               CORPECTOMY CERVICAL FOUR-  CERVICAL FIVE, CERVICAL FIVE-               CERVICAL SIX, CERVICAL SIX- CERVICAL SEVEN;  Surgeon:               Mavis Purchase, MD;  Location: [facility] OR;  Service:               Neurosurgery;  Laterality: N/A;  ANTERIOR CERVICAL               DECOMPRESSION/DISCECTOMY FUSION, INTERBODY PROSTHESIS,               PLATE/SCREWS, POSSIBLE CORPE No date: APPENDECTOMY     Comment:  2010 04/05/2022: COLONOSCOPY; N/A     Comment:  Procedure: COLONOSCOPY;  Surgeon: Onita Elspeth Sharper,              DO;  Location: [facility] ENDOSCOPY;  Service:               Gastroenterology;  Laterality: N/A; 04/05/2022: ESOPHAGOGASTRODUODENOSCOPY; N/A     Comment:  Procedure: ESOPHAGOGASTRODUODENOSCOPY (EGD);  Surgeon:               Onita Elspeth Sharper, DO;  Location: [facility] ENDOSCOPY;                Service: Gastroenterology;  Laterality: N/A; No date: OVARIAN CYST REMOVAL No date: TUBAL LIGATION     Comment:  1998     Reproductive/Obstetrics negative OB ROS  Anesthesia Physical Anesthesia Plan  ASA: 3  Anesthesia Plan: General ETT   Post-op Pain Management: Ofirmev  IV (intra-op)*, Toradol  IV (intra-op)* and Dilaudid  IV   Induction: Intravenous  PONV Risk Score and Plan: 3 and Ondansetron , Dexamethasone , Midazolam  and Treatment may vary due to age or medical condition  Airway Management Planned: Oral ETT  Additional Equipment:   Intra-op Plan:   Post-operative Plan: Extubation in OR  Informed Consent: I have reviewed the patients History and Physical, chart, labs and discussed the procedure including the risks, benefits and alternatives for the proposed anesthesia with the patient or authorized representative who has indicated his/her understanding and acceptance.     Dental Advisory Given  Plan Discussed with: Anesthesiologist, CRNA and Surgeon  Anesthesia Plan Comments: (Patient consented for risks of anesthesia including but not limited  to:  - adverse reactions to medications - damage to eyes, teeth, lips or other oral mucosa - nerve damage due to positioning  - sore throat or hoarseness - Damage to heart, brain, nerves, lungs, other parts of body or loss of life  Patient voiced understanding and assent.)        Anesthesia Quick Evaluation

## 2023-11-23 NOTE — Anesthesia Procedure Notes (Signed)
 Procedure Name: Intubation Date/Time: 11/23/2023 12:15 PM  Performed by: Jackye Spanner, CRNAPre-anesthesia Checklist: Patient identified, Patient being monitored, Timeout performed, Emergency Drugs available and Suction available Patient Re-evaluated:Patient Re-evaluated prior to induction Oxygen Delivery Method: Circle system utilized Preoxygenation: Pre-oxygenation with 100% oxygen Induction Type: IV induction Ventilation: Mask ventilation without difficulty Laryngoscope Size: 3 and McGrath Grade View: Grade I Tube type: Oral Tube size: 7.0 mm Number of attempts: 1 Airway Equipment and Method: Stylet Placement Confirmation: ETT inserted through vocal cords under direct vision, positive ETCO2 and breath sounds checked- equal and bilateral Secured at: 21 cm Tube secured with: Tape Dental Injury: Teeth and Oropharynx as per pre-operative assessment  Comments: Smooth atraumatic intubation, no complications noted.

## 2023-11-24 ENCOUNTER — Ambulatory Visit: Payer: MEDICAID

## 2023-11-24 ENCOUNTER — Encounter: Payer: Self-pay | Admitting: General Surgery

## 2023-11-25 LAB — SURGICAL PATHOLOGY

## 2023-11-29 NOTE — Anesthesia Postprocedure Evaluation (Signed)
 Anesthesia Post Note  Patient: Peggy JAYSON Kitty  Procedure(s) Performed: CHOLECYSTECTOMY, ROBOT-ASSISTED, LAPAROSCOPIC (Abdomen) INDOCYANINE GREEN FLUORESCENCE IMAGING (ICG)  Patient location during evaluation: PACU Anesthesia Type: General Level of consciousness: awake and alert Pain management: pain level controlled Vital Signs Assessment: post-procedure vital signs reviewed and stable Respiratory status: spontaneous breathing, nonlabored ventilation, respiratory function stable and patient connected to nasal cannula oxygen Cardiovascular status: blood pressure returned to baseline and stable Postop Assessment: no apparent nausea or vomiting Anesthetic complications: no   No notable events documented.   Last Vitals:  Vitals:   11/23/23 1430 11/23/23 1447  BP: (!) 154/92 (!) 151/95  Pulse: 79 76  Resp: 15 16  Temp: (!) 36.3 C 36.4 C  SpO2: 97% 97%    Last Pain:  Vitals:   11/23/23 1447  TempSrc: Temporal  PainSc: 5                  Lendia LITTIE Mae
